# Patient Record
Sex: Female | Born: 1985
Health system: Southern US, Community
[De-identification: ages and names within clinical notes are randomized; demographics above are authoritative.]

## PROBLEM LIST (undated history)

## (undated) ENCOUNTER — Emergency Department (HOSPITAL_BASED_OUTPATIENT_CLINIC_OR_DEPARTMENT_OTHER): Payer: BLUE CROSS/BLUE SHIELD | Source: Home / Self Care

## (undated) DIAGNOSIS — Z8759 Personal history of other complications of pregnancy, childbirth and the puerperium: Secondary | ICD-10-CM

## (undated) DIAGNOSIS — I10 Essential (primary) hypertension: Secondary | ICD-10-CM

## (undated) DIAGNOSIS — N920 Excessive and frequent menstruation with regular cycle: Secondary | ICD-10-CM

## (undated) DIAGNOSIS — G43909 Migraine, unspecified, not intractable, without status migrainosus: Secondary | ICD-10-CM

## (undated) DIAGNOSIS — J302 Other seasonal allergic rhinitis: Secondary | ICD-10-CM

## (undated) DIAGNOSIS — K219 Gastro-esophageal reflux disease without esophagitis: Secondary | ICD-10-CM

## (undated) DIAGNOSIS — N926 Irregular menstruation, unspecified: Secondary | ICD-10-CM

## (undated) DIAGNOSIS — D649 Anemia, unspecified: Secondary | ICD-10-CM

## (undated) DIAGNOSIS — D573 Sickle-cell trait: Secondary | ICD-10-CM

## (undated) HISTORY — PX: WISDOM TOOTH EXTRACTION: SHX21

## (undated) HISTORY — DX: Gastro-esophageal reflux disease without esophagitis: K21.9

## (undated) HISTORY — DX: Essential (primary) hypertension: I10

## (undated) HISTORY — DX: Sickle-cell trait: D57.3

## (undated) HISTORY — DX: Migraine, unspecified, not intractable, without status migrainosus: G43.909

---

## 2008-10-19 ENCOUNTER — Inpatient Hospital Stay (HOSPITAL_COMMUNITY): Admission: AD | Admit: 2008-10-19 | Discharge: 2008-10-19 | Payer: Self-pay | Admitting: Obstetrics and Gynecology

## 2008-11-18 ENCOUNTER — Inpatient Hospital Stay (HOSPITAL_COMMUNITY): Admission: AD | Admit: 2008-11-18 | Discharge: 2008-11-18 | Payer: Self-pay | Admitting: Obstetrics and Gynecology

## 2008-11-20 ENCOUNTER — Inpatient Hospital Stay (HOSPITAL_COMMUNITY): Admission: AD | Admit: 2008-11-20 | Discharge: 2008-11-20 | Payer: Self-pay | Admitting: Obstetrics and Gynecology

## 2008-11-21 ENCOUNTER — Inpatient Hospital Stay (HOSPITAL_COMMUNITY): Admission: AD | Admit: 2008-11-21 | Discharge: 2008-11-21 | Payer: Self-pay | Admitting: Obstetrics and Gynecology

## 2008-11-26 ENCOUNTER — Inpatient Hospital Stay (HOSPITAL_COMMUNITY): Admission: AD | Admit: 2008-11-26 | Discharge: 2008-11-26 | Payer: Self-pay | Admitting: Obstetrics and Gynecology

## 2008-11-27 ENCOUNTER — Inpatient Hospital Stay (HOSPITAL_COMMUNITY): Admission: AD | Admit: 2008-11-27 | Discharge: 2008-11-28 | Payer: Self-pay | Admitting: Obstetrics and Gynecology

## 2008-12-06 ENCOUNTER — Inpatient Hospital Stay (HOSPITAL_COMMUNITY): Admission: AD | Admit: 2008-12-06 | Discharge: 2008-12-08 | Payer: Self-pay | Admitting: Obstetrics and Gynecology

## 2008-12-07 ENCOUNTER — Encounter: Payer: Self-pay | Admitting: Obstetrics and Gynecology

## 2008-12-10 ENCOUNTER — Inpatient Hospital Stay (HOSPITAL_COMMUNITY): Admission: AD | Admit: 2008-12-10 | Discharge: 2008-12-10 | Payer: Self-pay | Admitting: Obstetrics and Gynecology

## 2008-12-13 ENCOUNTER — Inpatient Hospital Stay (HOSPITAL_COMMUNITY): Admission: AD | Admit: 2008-12-13 | Discharge: 2008-12-13 | Payer: Self-pay | Admitting: Obstetrics and Gynecology

## 2008-12-19 ENCOUNTER — Inpatient Hospital Stay (HOSPITAL_COMMUNITY): Admission: AD | Admit: 2008-12-19 | Discharge: 2008-12-19 | Payer: Self-pay | Admitting: Obstetrics & Gynecology

## 2008-12-21 ENCOUNTER — Inpatient Hospital Stay (HOSPITAL_COMMUNITY): Admission: AD | Admit: 2008-12-21 | Discharge: 2008-12-21 | Payer: Self-pay | Admitting: Obstetrics & Gynecology

## 2008-12-24 ENCOUNTER — Inpatient Hospital Stay (HOSPITAL_COMMUNITY): Admission: AD | Admit: 2008-12-24 | Discharge: 2008-12-24 | Payer: Self-pay | Admitting: Obstetrics and Gynecology

## 2008-12-27 ENCOUNTER — Inpatient Hospital Stay (HOSPITAL_COMMUNITY): Admission: AD | Admit: 2008-12-27 | Discharge: 2008-12-27 | Payer: Self-pay | Admitting: Obstetrics and Gynecology

## 2009-01-01 ENCOUNTER — Inpatient Hospital Stay (HOSPITAL_COMMUNITY): Admission: AD | Admit: 2009-01-01 | Discharge: 2009-01-01 | Payer: Self-pay | Admitting: Obstetrics and Gynecology

## 2009-01-02 ENCOUNTER — Inpatient Hospital Stay (HOSPITAL_COMMUNITY): Admission: AD | Admit: 2009-01-02 | Discharge: 2009-01-05 | Payer: Self-pay | Admitting: Obstetrics and Gynecology

## 2009-01-07 ENCOUNTER — Encounter: Admission: RE | Admit: 2009-01-07 | Discharge: 2009-02-04 | Payer: Self-pay | Admitting: Obstetrics and Gynecology

## 2009-08-10 IMAGING — US US FETAL BPP W/O NONSTRESS
1 series · 14 of 25 positions shown · non-contrast
Comparison: none

OBSTETRICAL ULTRASOUND:
 This ultrasound exam was performed in the [HOSPITAL] Ultrasound Department.  The OB US report was generated in the AS system, and faxed to the ordering physician.  This report is also available in [REDACTED] PACS.

[Series 1: us fetal bpp w/o nonstress · non-contrast · 14 of 25 slices shown]
[im 1/25]
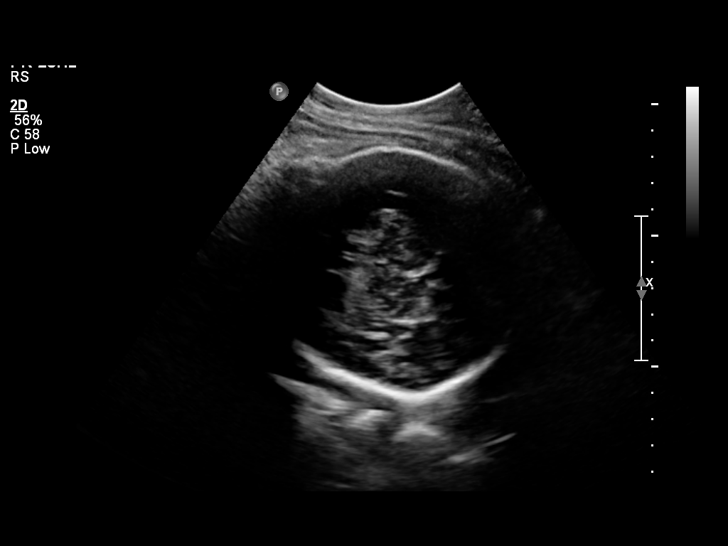
[im 3/25]
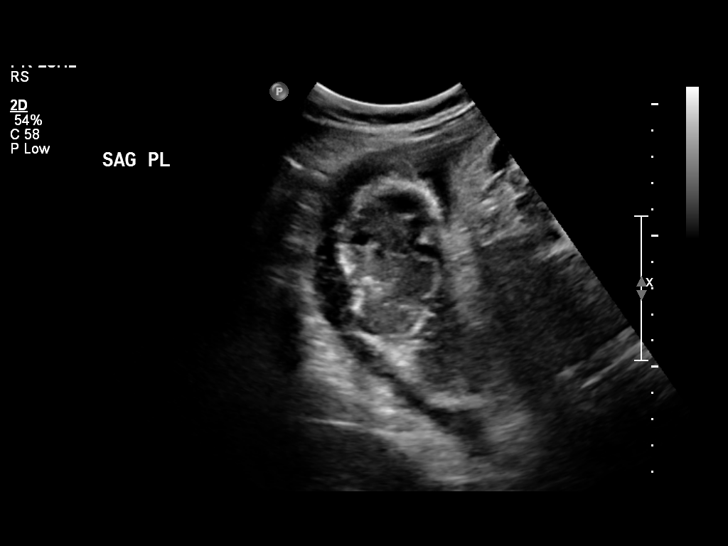
[im 5/25]
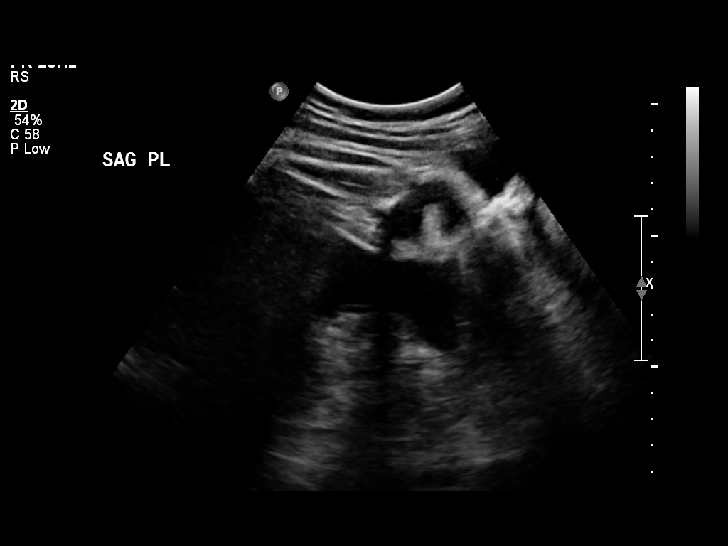
[im 7/25]
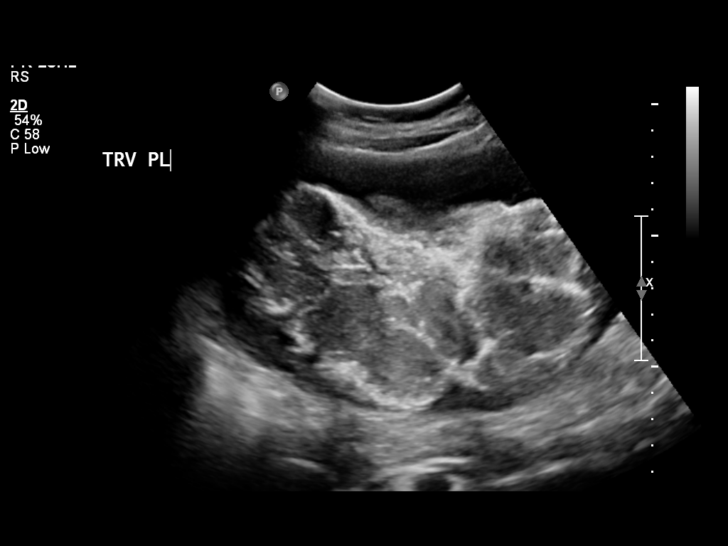
[im 9/25]
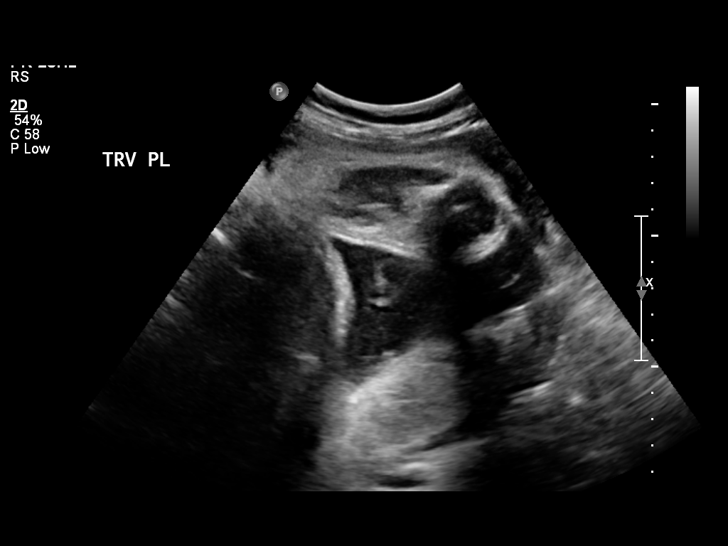
[im 10/25]
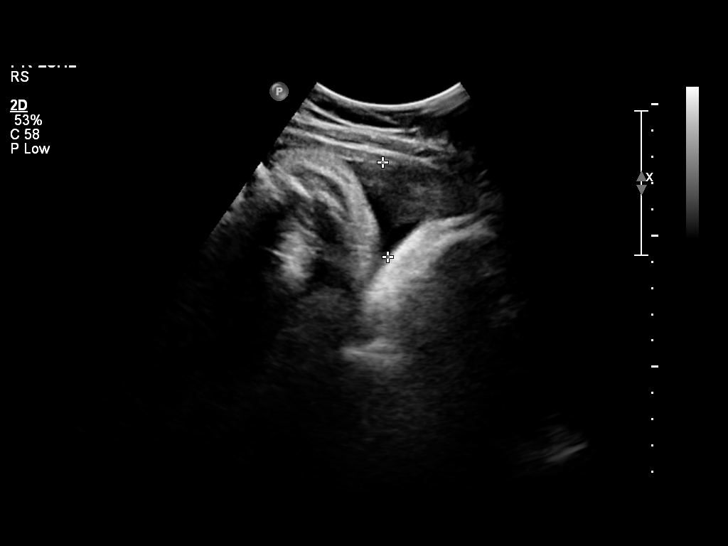
[im 12/25]
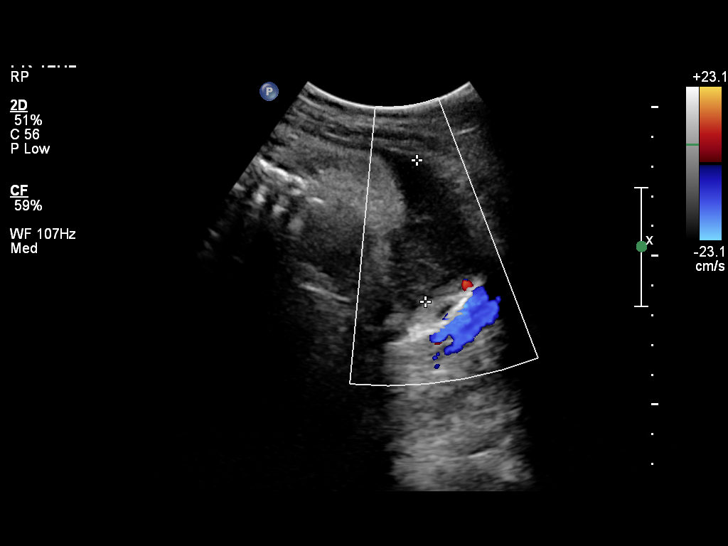
[im 14/25]
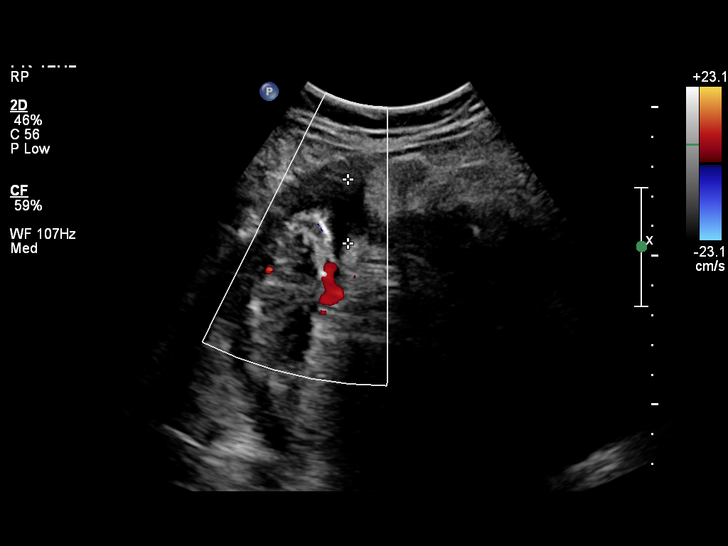
[im 16/25]
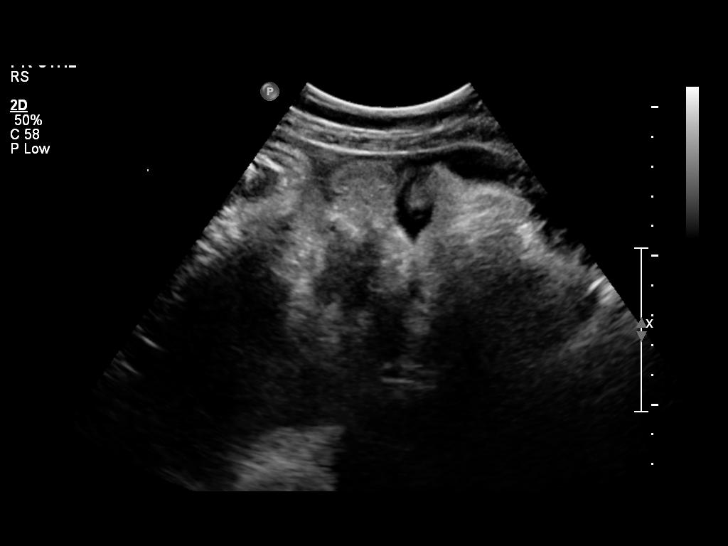
[im 17/25]
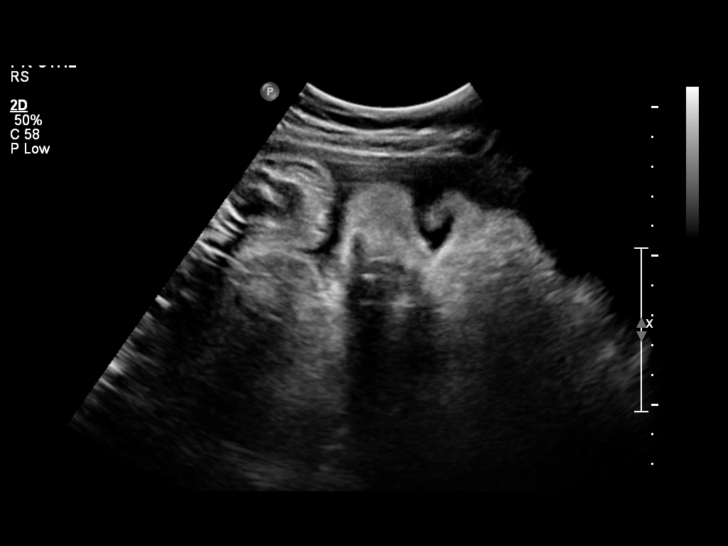
[im 19/25]
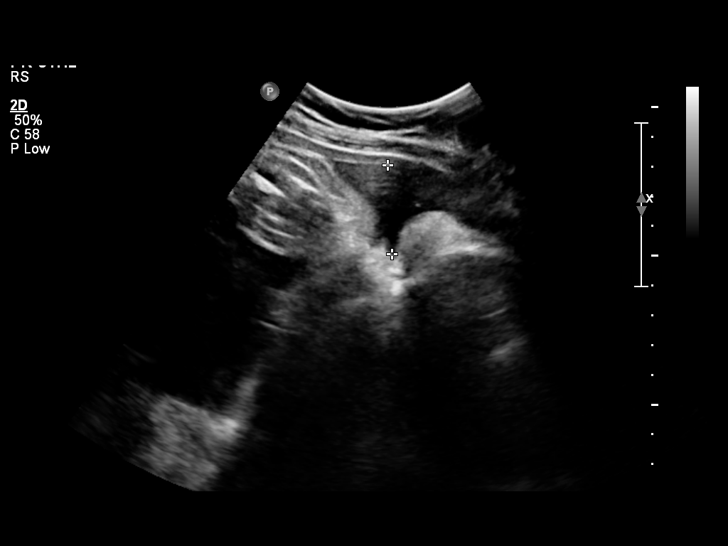
[im 21/25]
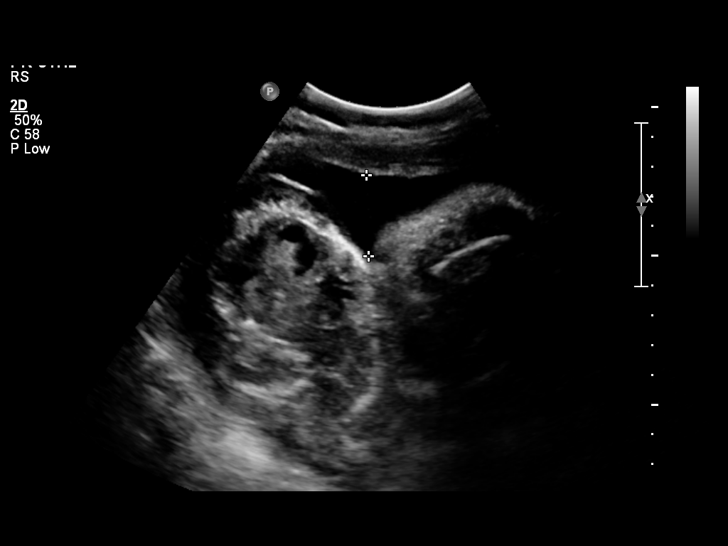
[im 23/25]
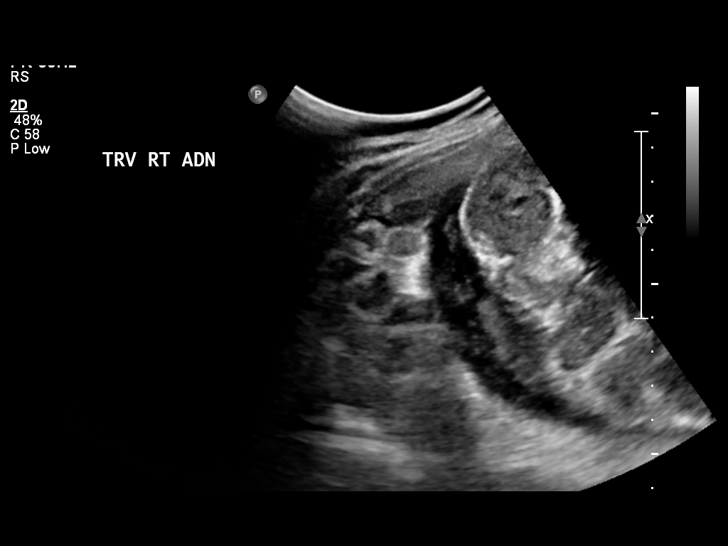
[im 25/25]
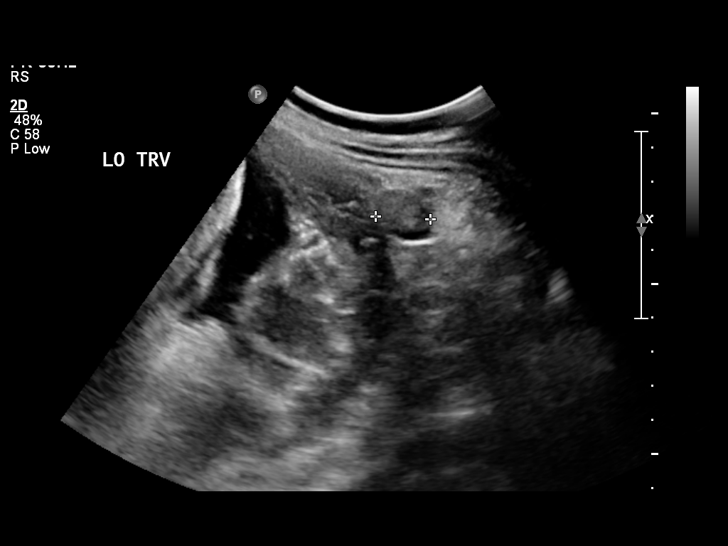

[14 of 25 positions shown; findings below may reference images not displayed]

IMPRESSION: See AS Obstetric US report.

## 2009-08-23 IMAGING — US US OB COMP +14 WK
1 series · 14 of 25 positions shown · non-contrast
Comparison: none

OBSTETRICAL ULTRASOUND:
 This ultrasound exam was performed in the [HOSPITAL] Ultrasound Department.  The OB US report was generated in the AS system, and faxed to the ordering physician.  This report is also available in [REDACTED] PACS.

[Series 1: us fetal bpp w/o nonstress · non-contrast · 25 acquisitions, 14 frames shown]
[im 1/25]
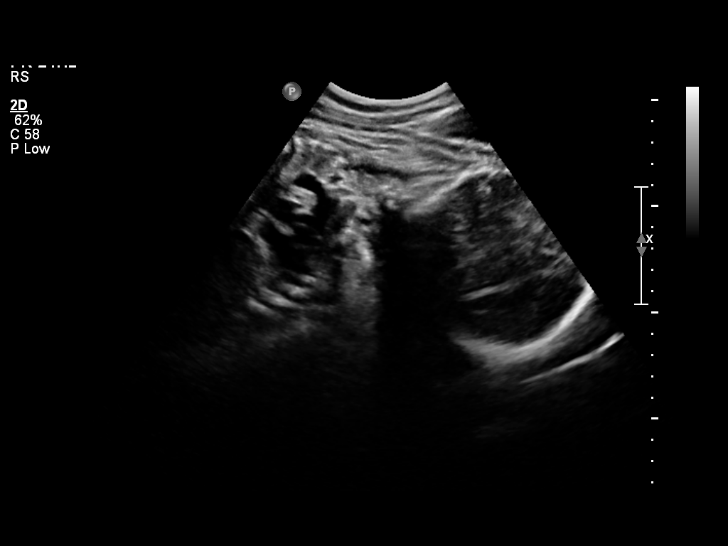
[im 3/25]
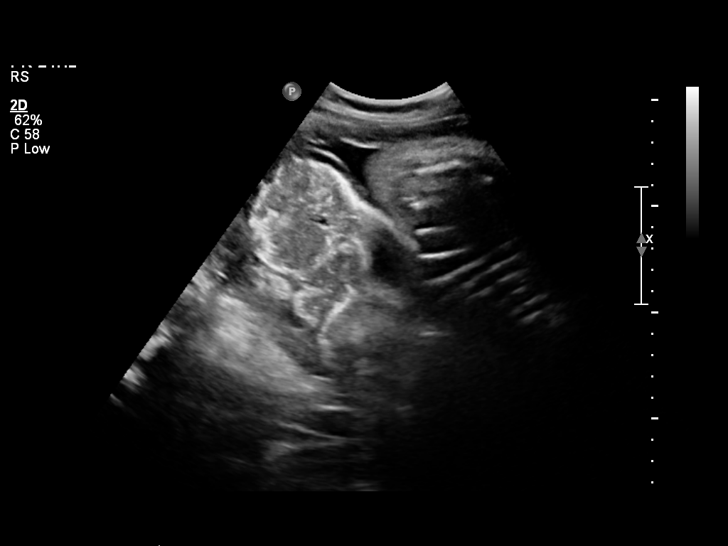
[im 5/25]
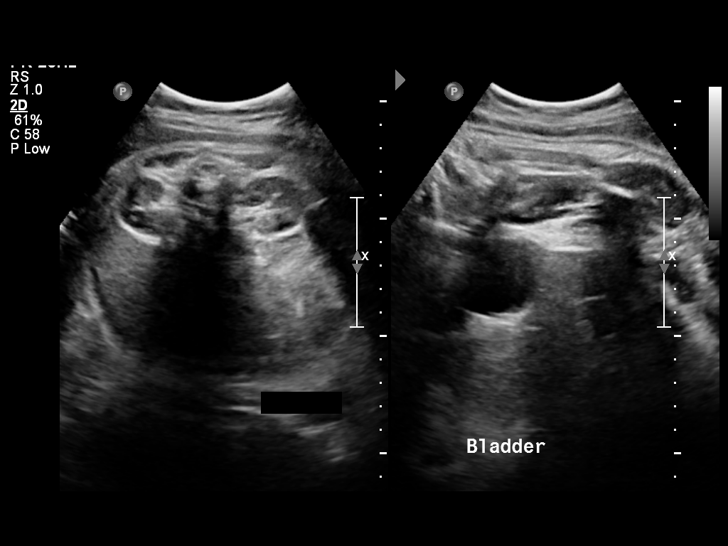
[im 7/25]
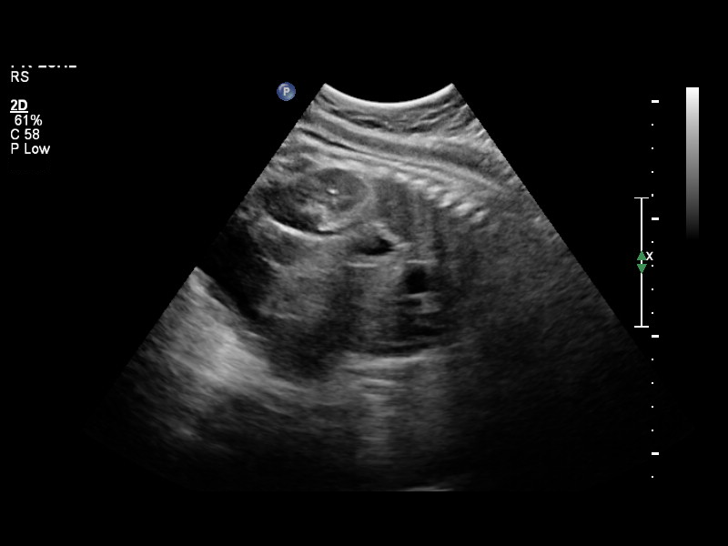
[im 9/25]
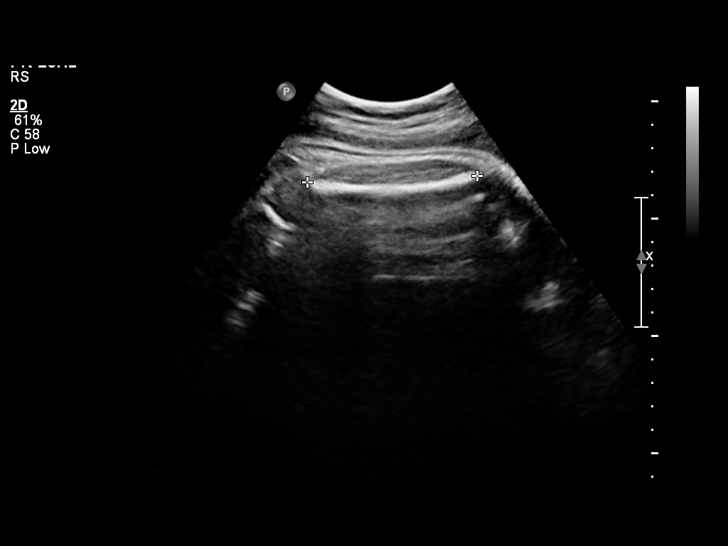
[im 10/25]
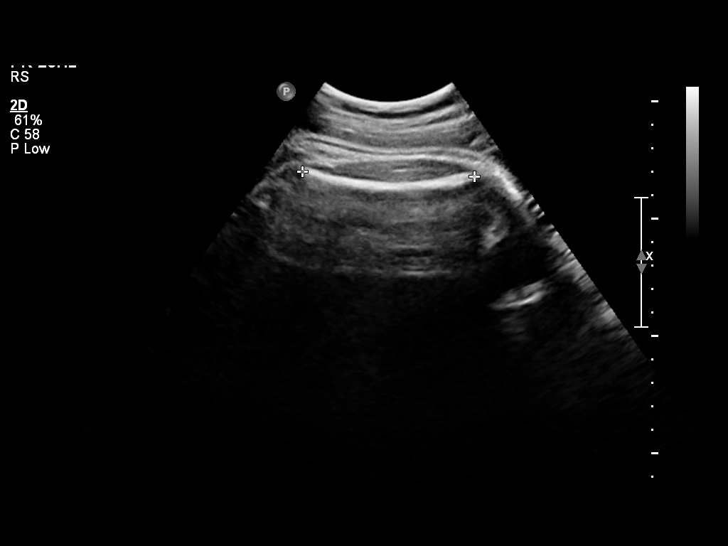
[im 12/25]
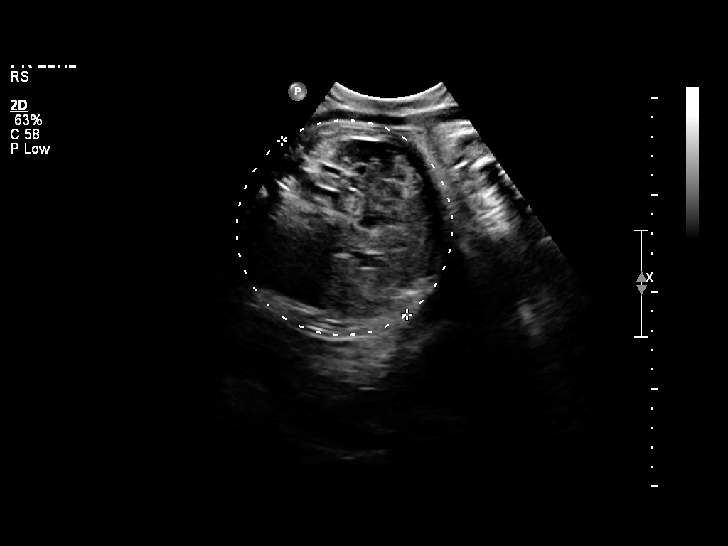
[im 14/25]
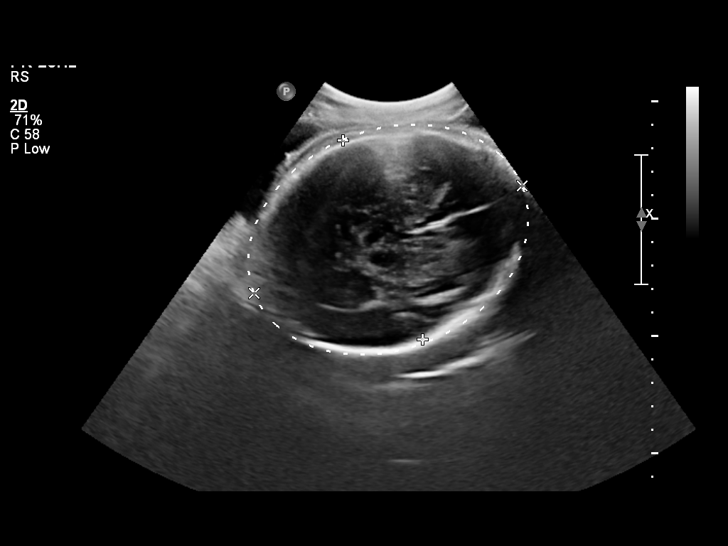
[im 16/25]
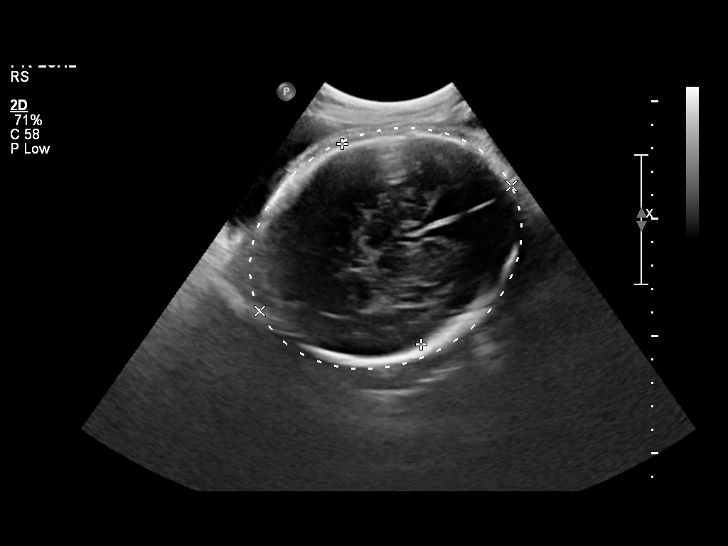
[im 17/25]
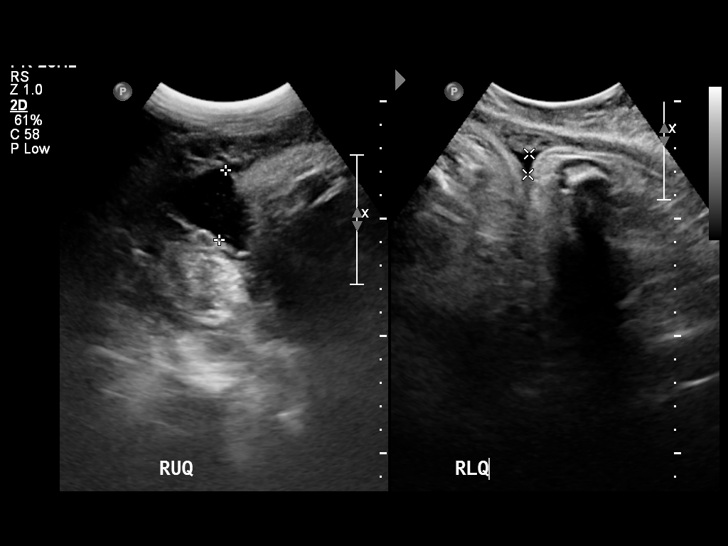
[im 19/25]
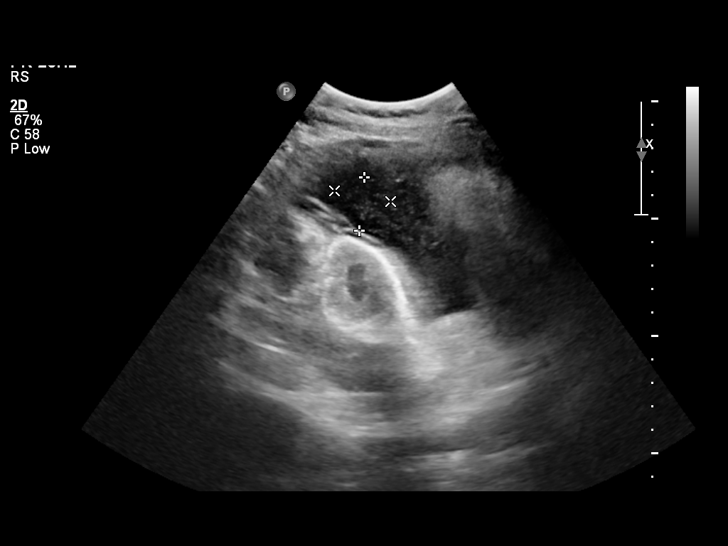
[im 21/25]
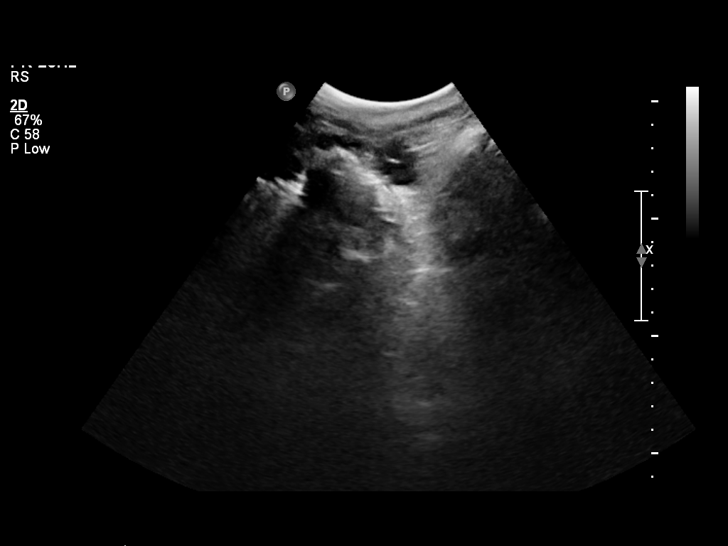
[im 23/25]
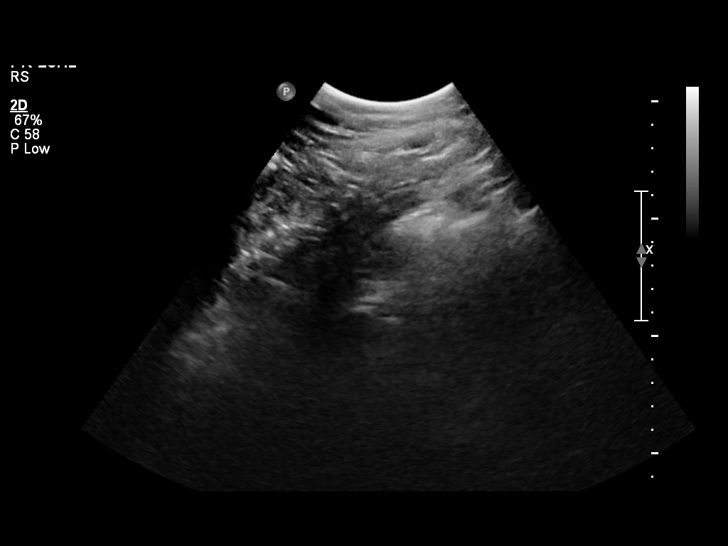
[im 25/25]
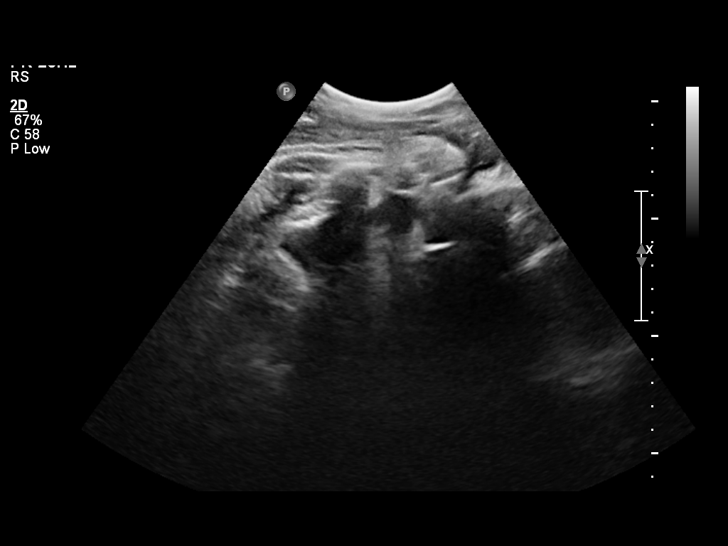

[14 of 25 positions shown; findings below may reference images not displayed]

IMPRESSION: See AS Obstetric US report.

## 2011-01-18 ENCOUNTER — Encounter: Payer: Self-pay | Admitting: Obstetrics and Gynecology

## 2011-04-13 LAB — CBC
HCT: 27.9 % — ABNORMAL LOW (ref 36.0–46.0)
Hemoglobin: 11 g/dL — ABNORMAL LOW (ref 12.0–15.0)
Hemoglobin: 9.2 g/dL — ABNORMAL LOW (ref 12.0–15.0)
MCHC: 32.9 g/dL (ref 30.0–36.0)
MCV: 81.4 fL (ref 78.0–100.0)
MCV: 82.7 fL (ref 78.0–100.0)
Platelets: 246 10*3/uL (ref 150–400)
Platelets: 300 10*3/uL (ref 150–400)
RBC: 3.37 MIL/uL — ABNORMAL LOW (ref 3.87–5.11)
RBC: 4.1 MIL/uL (ref 3.87–5.11)
RDW: 17.9 % — ABNORMAL HIGH (ref 11.5–15.5)
RDW: 18 % — ABNORMAL HIGH (ref 11.5–15.5)
WBC: 10.4 10*3/uL (ref 4.0–10.5)

## 2011-04-13 LAB — RPR: RPR Ser Ql: NONREACTIVE

## 2011-05-12 NOTE — Discharge Summary (Signed)
NAME:  Kimberly Guerra, Kimberly Guerra           ACCOUNT NO.:  [id number]   MEDICAL RECORD NO.:  [id number]          PATIENT TYPE:  INP   LOCATION:  9161                          FACILITY:  [facility]   PHYSICIAN:  Guy Sandifer. Henderson Cloud, M.D. DATE OF BIRTH:  Apr 29, 1986   DATE OF ADMISSION:  11/27/2008  DATE OF DISCHARGE:  11/28/2008                               DISCHARGE SUMMARY   ADMITTING DIAGNOSES:  1. Intrauterine pregnancy at 33 weeks, estimated gestational age.  2. Preterm labor.  3. Low amniotic fluid.   DISCHARGE DIAGNOSES:  1. Intrauterine pregnancy at 33 weeks, estimated gestational age.  2. Preterm labor.  3. Low amniotic fluid.   REASON FOR ADMISSION:  This patient is a 25 year old black female G2,  P0, EDC of 01/15/2009.  She has been in the hospital for preterm labor  and received betamethasone.  She had been discharged home, and presented  the office for a scheduled reevaluation.  A nonstress test was done that  was reactive, but there were some variable decelerations.  Ultrasound  revealed a biophysical profile of 8/8.  Amniotic fluid index was 9.1,  which is at the 10th percentile.  Estimated fetal weight was 76  percentile and the cervical length was 1.8 cm.  In view of these  findings, the patient was admitted to hospital for observation.   HOSPITAL COURSE:  The patient was admitted to hospital where she  receives IV fluids and rest.  Through the night, she complained of  severe back pain.  This did not correlate with contractions noted on the  monitor.  The baby was reactive.  The patient was treated first with  oral narcotics followed by IV Stadol, which relieved her back spasm.  She had a repeat ultrasound on the a.m. of November 28, 2008, which was  not long after her IV Stadol.  An AFI of 8.9 at the 10th percentile was  noted.  Baby was transverse at the maternal left.  BPP was 4/8.  Dopplers were normal.  Consultation with Maternal-Fetal Medicine was  obtained.  In view of the  recent narcotic administration, Dr. Margot Ables  recommended a repeat biophysical profile later in the day.  Repeat  biophysical profile later in the afternoon was 8/8 with a vertex  presentation.  Dr. Margot Ables had recommended discharged home in the event  of an 8/8 biophysical profile.  This was discussed with the patient.   CONDITION ON DISCHARGE:  Stable.   DIET:  Regular as tolerated.   ACTIVITY:  Careful instructions were given to the patient and her  mother.  Modified bedrest is carefully discussed.  Fetal movement counts  were reviewed.   FOLLOWUP:  Followup will be in 2 days in the office for an ultrasound  and nonstress test followed by an appointment with Maternal-Fetal  Medicine for ultrasound in 5 days.  She will call for any decrease in  fetal movement, increase in pelvic pressure, suspicion of leaking,  bleeding, etc.   MEDICATIONS:  1. Procardia 10 mg q.6 h.  2. Flexeril 10 mg, #30 one p.o. q.8 h. p.r.n. back pain, no refills.  3. Vicodin, #30  one to two q.6 h. p.r.n. back pain, no refills.      Guy Sandifer Henderson Cloud, M.D.  Electronically Signed     JET/MEDQ  D:  11/28/2008  T:  11/29/2008  Job:  [id number]   cc:   Felipa Eth, MD  Fax: [phone]

## 2011-05-12 NOTE — Op Note (Signed)
NAME:  Kimberly Guerra, Kimberly Guerra           ACCOUNT NO.:  [id number]   MEDICAL RECORD NO.:  [id number]          PATIENT TYPE:  INP   LOCATION:  9109                          FACILITY:  [facility]   PHYSICIAN:  Michelle L. Grewal, M.D.DATE OF BIRTH:  [birth year]   DATE OF PROCEDURE:  01/02/2009  DATE OF DISCHARGE:                               OPERATIVE REPORT   PREOPERATIVE DIAGNOSES:  1. Intrauterine pregnancy at term.  2. Arrest of dilatation at 5 cm.   POSTOPERATIVE DIAGNOSES:  1. Intrauterine pregnancy at term.  2. Arrest of dilatation at 5 cm.   PROCEDURE:  Primary low transverse cesarean section.   SURGEON:  Michelle L. Vincente Poli, MD.   ANESTHESIA:  Epidural.   FINDINGS:  Female infant in cephalic presentation.  Apgars 9 at 1 minute  and 9 at 5 minutes.   ESTIMATED BLOOD LOSS:  Less than 500 mL.   COMPLICATIONS:  None.   PROCEDURE:  The patient was taken to the operating room where her  epidural was dosed.  She was prepped and draped in the usual sterile  fashion.  A Foley catheter had been inserted while in Labor and  Delivery.  A sterile drape was applied.  A low-transverse incision was  made and carried down to the fascia.  The fascia was scored in the  midline and extended laterally.  The rectus muscles were separated in  the midline.  The peritoneum was entered bluntly.  The peritoneal  incision was then stretched.  The bladder blade was inserted.  The lower  uterine segment was identified and the bladder flap was created sharply  and then digitally.  The bladder blade was then readjusted.  A low-  transverse incision was made in the uterus.  The uterus was entered  using the hemostat.  The baby was in the cephalic presentation and was  delivered easily with 1 gentle pull of the vacuum.  The baby was a female  infant in cephalic presentation with Apgars 9 at 1 minute and 9 at 5  minutes.  The neonatal team was there to take care of the baby and  subsequently took the baby to the  nursery.  The placenta was manually  removed and noted to be normal intact with a 3-vessel cord.  The uterus  was exteriorized and cleared of all clots and debris.  Adnexa appeared  normal.  The uterine incision was closed in 2 layers using 0 chromic in  a running locked stitch.  The uterus was returned to the abdomen.  Irrigation was performed.  The incision was hemostatic.  The peritoneum  and rectus  muscles were reapproximated using 0 Vicryl.  The fascia was closed using  0 Vicryl in a running stitch.  After irrigation of the subcutaneous  layer, the skin was closed with staples.  All sponge, lap, and  instrument counts were correct x2.  The patient went to recovery room in  stable condition.      Michelle L. Vincente Poli, M.D.  Electronically Signed     MLG/MEDQ  D:  01/02/2009  T:  01/03/2009  Job:  [id number]

## 2011-05-12 NOTE — H&P (Signed)
NAMEMarland Kitchen  Kimberly Guerra, Kimberly Guerra           ACCOUNT NO.:  [id number]   MEDICAL RECORD NO.:  [id number]          PATIENT TYPE:  INP   LOCATION:  9163                          FACILITY:  [facility]   PHYSICIAN:  Freddy Finner, M.D.   DATE OF BIRTH:  07-06-1986   DATE OF ADMISSION:  12/06/2008  DATE OF DISCHARGE:                              HISTORY & PHYSICAL   ADMISSION DIAGNOSES:  1. Intrauterine pregnancy at 34-2/[redacted] weeks gestation.  2. Decreased amniotic fluid index.   The patient is a 25 year old gravida 2, para 0 who has been followed  through her prenatal course that was uneventful until the recent past  when she was found to have a decreased amniotic fluid index.  Subsequent  follow-up ultrasounds over the last approximately 9 days have shown an  initial improvement of her fluid volume on December 4, but on her  ultrasound in the office today her fluid volume was less and the third  percentile with an AFI of 7.1 cm.  By ultrasound exam her cervix was  normal.  Her fundal height was 34 cm.  Her blood pressure in the office,  124/70.  She is admitted now for hydration, bedrest and reevaluation of  decreased amniotic fluid index.   PAST MEDICAL HISTORY/FAMILY HISTORY:  Recorded in the prenatal summary  and will not be repeated.   PHYSICAL EXAMINATION:  HEENT:  Grossly normal.  Thyroid gland is not palpably enlarged.  Blood pressure 124/70.  CHEST:  Clear to auscultation.  HEART:  Normal sinus rhythm.  There is an early systolic murmur  consistent with a flow murmur.  No other audible murmurs.  No rubs.  No  gallops.  ABDOMEN:  Gravid.  Fundal height as noted above, 34 cm.  CERVICAL EXAM:  Not performed.  EXTREMITIES:  Without cyanosis, clubbing or edema.   ASSESSMENT:  1. Intrauterine pregnancy at 34-2/7.  2. Decreased fluid volume.   PLAN:  IV hydration, bedrest.  Reevaluation with ultrasound in 24-48  hours. S      W. Varney Baas, M.D.  Electronically Signed     WRN/MEDQ   D:  12/06/2008  T:  12/06/2008  Job:  [id number]

## 2011-05-12 NOTE — H&P (Signed)
NAME:  Kimberly Guerra, Kimberly Guerra           ACCOUNT NO.:  [id number]   MEDICAL RECORD NO.:  [id number]          PATIENT TYPE:  INP   LOCATION:  9161                          [facility]ACILITY:  [facility]   PHYSICIAN:  Juluis Mire, M.D.   DATE OF BIRTH:  1986-06-01   DATE OF ADMISSION:  11/27/2008  DATE OF DISCHARGE:                              HISTORY & PHYSICAL   The patient is a 25 year old gravida 2, para 0, abortus 1, female whose  last menstrual period is April 09, 2008, gives her estimated date of  confinement of January 15, 2009, and gives her estimated gestational age  of [redacted] weeks.  She evidently had been seen in the hospital with preterm  labor.  She has been receiving betamethasone.  Came in today for  reevaluation of nonstress test.  Nonstress test was reactive, but there  were some variables.  We did an ultrasound.  Biophysical profile is 8/8.  Amniotic fluid index was 9.1 cm, which is the 10th percentile.  Estimated fetal weight was 76th percentile.  Cervical length was 1.8 cm.  In view of the decreasing fluid and variables, we are going to bring her  in the hospital for hydration monitoring.  Repeat ultrasound tomorrow  for reassessment.  She will receive a second dose of betamethasone.   ALLERGIES:  No known drug allergies.   MEDICATION:  Prenatal vitamins.   PAST MEDICAL HISTORY:  Please see prenatal records.   FAMILY HISTORY:  Please see prenatal records.   SOCIAL HISTORY:  Please see prenatal records.   REVIEW OF SYSTEMS:  Noncontributory.   PHYSICAL EXAMINATION:  GENERAL:  The patient is afebrile.  VITAL SIGNS:  Stable.  HEENT: The patient is normocephalic.  Pupils are equal, round, and  reactive to light and accommodation.  Extraocular movements are intact.  Oropharynx is clear.  LUNGS:  Clear.  CARDIAC:  Regular rate without murmurs or gallops.  ABDOMEN:  Gravid.  Uterus is consistent with dates.  PELVIC:  Cervix is fingertip, 50%.  EXTREMITIES:  Trace edema.  NEUROLOGIC:  Grossly within normal limits.  Deep tendon reflexes are 2+,  no clonus.   IMPRESSION:  1. Intrauterine pregnancy at 33 weeks with borderline oligohydramnios.  2. Variable decelerations on monitoring, but reactive tracing.  3. Reassuring biophysical profile.   PLAN:  The patient brought in for IV hydration.  We will continue  monitoring and repeat her ultrasound tomorrow for assessment.      Juluis Mire, M.D.  Electronically Signed     JSM/MEDQ  D:  11/27/2008  T:  11/28/2008  Job:  [id number]

## 2011-05-15 NOTE — Discharge Summary (Signed)
NAMEMarland Guerra  Kimberly, Guerra           ACCOUNT NO.:  [id number]   MEDICAL RECORD NO.:  [id number]          PATIENT TYPE:  INP   LOCATION:  9109                          [facility]ACILITY[facility]   PHYSICIAN:  Zelphia Cairo, MD    DATE OF BIRTH:  02/14/1986   DATE OF ADMISSION:  01/02/2009  DATE OF DISCHARGE:  01/05/2009                               DISCHARGE SUMMARY   ADMITTING DIAGNOSES:  1. Intrauterine pregnancy at 38-1/7 weeks estimated gestational age.  2. Induction of labor.   DISCHARGE DIAGNOSES:  1. Status post low-transverse cesarean section secondary to failure to      progress.  2. Viable female infant.   PROCEDURE:  Primary low-transverse cesarean section.   REASON FOR ADMISSION:  Please see written H&P.   HOSPITAL COURSE:  The patient is 25 year old gravida 2, para 0 that  presented to [facility] for induction of labor.  The  patient had presented to [facility] where she complained of some pain in her  lower abdomen.  Fetal heart tones were nonreactive and biophysical  profile was 4/10.  Based on the biophysical profile, a score of 4//10.  The patient was now admitted for induction of labor.  Vital signs were  stable.  Fetal heart tones were in 140s with decreased variability.  Contractions was somewhat irregular.  Cervix was found to be 3 cm, 100%  effaced, vertex, at a zero station.  Artificial rupture of membranes was  performed, which revealed a scant amount of fluid.  The patient was now  started on Pitocin for augmentation of her labor and epidural was placed  for her comfort.  Later that evening, the patient had been in a good  pattern of labor all day.  Cervix was reexamined and found to be 5 cm  dilated, 90% effaced,  with caput.  Given there had not been any further  change in dilatation over 3 hours, contractions were strong and regular.  Decision was made to proceed with a primary low-transverse cesarean  section.  Benefits and risks associated with surgery  were discussed with  the patient and she was transferred then to the operating room.  Epidural was dosed to an adequate surgical level.  A low transverse  incision was made with delivery of a viable female infant weighing 7  pounds 15 ounces with Apgars of 9 at 1 minute and 9 at 5 minutes.  The  patient tolerated the procedure well and taken to the recovery room in  stable condition.  On postoperative day #1, the patient complained of  some mild nausea, no vomiting.  Vital signs were stable.  She was  afebrile.  Abdomen was soft with some decrease in bowel sounds.  Fundus  was firm and nontender.  Abdominal dressing was noted to have a scant  amount of drainage noted on the bandage.  Foley was draining with  adequate amounts of urine output.  Laboratory findings revealed  hemoglobin 9.2, platelet count of 246,000, WBC count of 16.5.  On  postoperative day #2, the patient denied headache or blurred vision.  Vital signs were stable with blood pressure 133/89  to 143/90.  Deep  tendon reflexes 1 to 2+.  No clonus, no pedal edema was observed.  Abdomen was soft with good return of bowel function.  Fundus was firm  and nontender.  Incision was clean, dry, and intact.  On postoperative  day #3, the patient was without complaint.  Vital signs were stable with  blood pressure 137/89.  Abdomen soft.  Fundus firm and nontender.  Incision was clean, dry, and intact.   DISCHARGE INSTRUCTIONS:  Reviewed, and the patient was later discharged  home.   CONDITION ON DISCHARGE:  Stable.   DIET:  Regular as tolerated.   ACTIVITY:  No heavy lifting, no driving x2 weeks, no vaginal entry.   FOLLOW UP:  The patient to follow up in the office in 2 days for staple  removal and a blood pressure check.  She is to call for temperature  greater than 100 degrees, persistent nausea, vomiting, heavy vaginal  bleeding and/or redness or drainage from the incisional site.  The  patient was also instructed to call for  headache, blurred vision, or  right upper quadrant pain.   DISCHARGE MEDICATIONS:  1. Percocet 5/325, #30, one  p.o. every 4-6 hours.  2. Motrin 600 mg every 6 hours.  3. Ferrous sulfate 325 mg one p.o. b.i.d.      Julio Sicks, N.P.      Zelphia Cairo, MD  Electronically Signed    CC/MEDQ  D:  02/12/2009  T:  02/12/2009  Job:  [id number]

## 2011-05-15 NOTE — Discharge Summary (Signed)
NAMEMarland Kitchen  Kimberly Guerra, Kimberly Guerra           ACCOUNT NO.:  [id number]   MEDICAL RECORD NO.:  [id number]          PATIENT TYPE:  INP   LOCATION:  9149                          [facility]ACILITY:  [facility]   PHYSICIAN:  Guy Sandifer. Henderson Cloud, M.D. DATE OF BIRTH:  [birth year]   DATE OF ADMISSION:  12/06/2008  DATE OF DISCHARGE:  12/08/2008                               DISCHARGE SUMMARY   ADMITTING DIAGNOSES:  1. Intrauterine pregnancy at 34-2/7 weeks' estimated gestational age.  2. Decreased amniotic fluid index.   DISCHARGE DIAGNOSES:  1. Intrauterine pregnancy at 34-4/7 weeks' estimated gestational age.  2. Normal amniotic fluid index.   REASON FOR ADMISSION:  Please see dictated H&P.   HOSPITAL COURSE:  The patient is a 25 year old gravida 2, para 0 who was  admitted to [facility] for IV hydration and bed rest.  The patient had been seen in the office where it was noted that she had  decreased amniotic fluid.  The patient had been seen in the office with  an ultrasound that had revealed that the fluid volume was less than the  third percentile, pregnancy had been otherwise uncomplicated.  Fundal  height was 34 cm, blood pressure was normal at 124/70.  The patient was  admitted for hydration, bed rest,  and reevaluation of decreased  amniotic fluid index.   On admission vital signs were stable with blood pressure 124/70.  The  patient was scheduled for maternal fetal medicine consult, biophysical  profile with Doppler studies.  Later that evening, studies had been  completed and AFI was 8.54 which is the 9th percentile.  Biophysical  profile was 8/8 and a Doppler studies were within normal limits.  Fetal  heart tones were reactive.  The patient continued on bed rest with IV  fluids and plan for rescan the following morning.  On the following  morning, the patient was now 34-4/7 weeks'.  She denied headache,  blurred vision, or epigastric pain.  Blood pressures was 140/87.  Lungs  were clear  to auscultation.  Heart was regular rate and rhythm.  Abdomen  was gravid uterus.  No epigastric tenderness noted.  Deep tendon  reflexes are 1+, no clonus.  Amniotic fluid index was up to 11.37,  vertex presentation.  PIH labs were examined and found to be within  normal limits.  Blood pressure was reexamined later that morning and  patient desired to have her discharged and patient was later discharged  home on bed rest.   CONDITION ON DISCHARGE:  Stable diet, regular as tolerated.   ACTIVITY:  Modified bedrest.  The patient was encouraged to call for  decrease in fetal movement or loss of amniotic fluid, vaginal bleeding,  or increasing contractions or pelvic pressure.   DISCHARGE MEDICATIONS:  1. Prenatal vitamins 1 p.o. daily.  2. Colace 1 p.o. daily p.r.n.      Julio Sicks, N.P.      Guy Sandifer. Henderson Cloud, M.D.  Electronically Signed    CC/MEDQ  D:  02/12/2009  T:  02/12/2009  Job:  [id number]

## 2011-09-29 LAB — URINALYSIS, ROUTINE W REFLEX MICROSCOPIC
Bilirubin Urine: NEGATIVE
Glucose, UA: NEGATIVE
Ketones, ur: 15 — AB
Nitrite: NEGATIVE
Nitrite: NEGATIVE
Protein, ur: NEGATIVE
Specific Gravity, Urine: 1.01
Urobilinogen, UA: 0.2
pH: 5.5
pH: 6.5

## 2011-09-29 LAB — WET PREP, GENITAL
Clue Cells Wet Prep HPF POC: NONE SEEN
Trich, Wet Prep: NONE SEEN

## 2011-09-29 LAB — FETAL FIBRONECTIN: Fetal Fibronectin: NEGATIVE

## 2011-09-29 LAB — URINE MICROSCOPIC-ADD ON

## 2011-10-02 LAB — COMPREHENSIVE METABOLIC PANEL
ALT: 11 U/L (ref 0–35)
AST: 17 U/L (ref 0–37)
Albumin: 2.7 g/dL — ABNORMAL LOW (ref 3.5–5.2)
Albumin: 3 g/dL — ABNORMAL LOW (ref 3.5–5.2)
BUN: 2 mg/dL — ABNORMAL LOW (ref 6–23)
Calcium: 9.7 mg/dL (ref 8.4–10.5)
Chloride: 106 mEq/L (ref 96–112)
Creatinine, Ser: 0.45 mg/dL (ref 0.4–1.2)
GFR calc Af Amer: >60 mL/min (ref >60)
Sodium: 135 mEq/L (ref 135–145)
Total Bilirubin: 0.3 mg/dL (ref 0.3–1.2)
Total Protein: 6.3 g/dL (ref 6.0–8.3)

## 2011-10-02 LAB — CBC
HCT: 32 % — ABNORMAL LOW (ref 36.0–46.0)
MCHC: 33.3 g/dL (ref 30.0–36.0)
MCHC: 33.4 g/dL (ref 30.0–36.0)
MCV: 81.8 fL (ref 78.0–100.0)
Platelets: 279 10*3/uL (ref 150–400)
RDW: 17.1 % — ABNORMAL HIGH (ref 11.5–15.5)
RDW: 17.2 % — ABNORMAL HIGH (ref 11.5–15.5)
WBC: 10.6 10*3/uL — ABNORMAL HIGH (ref 4.0–10.5)

## 2011-10-02 LAB — FETAL FIBRONECTIN: Fetal Fibronectin: NEGATIVE

## 2011-11-11 ENCOUNTER — Emergency Department (HOSPITAL_COMMUNITY)
Admission: EM | Admit: 2011-11-11 | Discharge: 2011-11-12 | Disposition: A | Discharge status: Home / Self Care | Payer: Self-pay | Attending: Emergency Medicine | Admitting: Emergency Medicine

## 2011-11-11 ENCOUNTER — Encounter: Payer: Self-pay | Admitting: Emergency Medicine

## 2011-11-11 DIAGNOSIS — R1032 Left lower quadrant pain: Secondary | ICD-10-CM | POA: Insufficient documentation

## 2011-11-11 DIAGNOSIS — N949 Unspecified condition associated with female genital organs and menstrual cycle: Secondary | ICD-10-CM | POA: Insufficient documentation

## 2011-11-11 DIAGNOSIS — R102 Pelvic and perineal pain: Secondary | ICD-10-CM

## 2011-11-11 LAB — DIFFERENTIAL
Lymphocytes Relative: 30 % (ref 12–46)
Lymphs Abs: 3.4 10*3/uL (ref 0.7–4.0)
Neutrophils Relative %: 62 % (ref 43–77)

## 2011-11-11 LAB — CBC
MCV: 74.9 fL — ABNORMAL LOW (ref 78.0–100.0)
Platelets: 464 10*3/uL — ABNORMAL HIGH (ref 150–400)
RBC: 4.62 MIL/uL (ref 3.87–5.11)
WBC: 11.3 10*3/uL — ABNORMAL HIGH (ref 4.0–10.5)

## 2011-11-11 NOTE — ED Notes (Signed)
PT. REPORTS LEFT ABDOMINAL PAIN ONSET THIS MORNING ,  NO NAUSEA OR VOMITTING , NO DIARRHEA, NO FEVER OR CHILLS, DENIES DYSURIA OR VAGINAL DISCHARGE.

## 2011-11-12 ENCOUNTER — Encounter (HOSPITAL_COMMUNITY): Payer: Self-pay | Admitting: *Deleted

## 2011-11-12 ENCOUNTER — Emergency Department (HOSPITAL_COMMUNITY): Payer: Self-pay

## 2011-11-12 LAB — WET PREP, GENITAL
WBC, Wet Prep HPF POC: NONE SEEN
Yeast Wet Prep HPF POC: NONE SEEN

## 2011-11-12 LAB — URINALYSIS, ROUTINE W REFLEX MICROSCOPIC
Glucose, UA: NEGATIVE mg/dL
Leukocytes, UA: NEGATIVE
Specific Gravity, Urine: 1.017 (ref 1.005–1.030)
pH: 7 (ref 5.0–8.0)

## 2011-11-12 LAB — BASIC METABOLIC PANEL
CO2: 24 mEq/L (ref 19–32)
Calcium: 9.7 mg/dL (ref 8.4–10.5)
GFR calc non Af Amer: >90 mL/min (ref >90)
Sodium: 136 mEq/L (ref 135–145)

## 2011-11-12 MED ORDER — HYDROMORPHONE HCL PF 1 MG/ML IJ SOLN
0.5000 mg | Freq: Once | INTRAMUSCULAR | Status: AC
  Administered 2011-11-12: 1 mg via INTRAVENOUS

## 2011-11-12 MED ORDER — HYDROMORPHONE HCL PF 1 MG/ML IJ SOLN
INTRAMUSCULAR | Status: AC
  Filled 2011-11-12: qty 1

## 2011-11-12 MED ORDER — OXYCODONE-ACETAMINOPHEN 5-325 MG PO TABS: 1.0000 | ORAL_TABLET | ORAL | Status: AC | PRN

## 2011-11-12 MED ORDER — DIPHENHYDRAMINE HCL 50 MG/ML IJ SOLN
25.0000 mg | Freq: Once | INTRAMUSCULAR | Status: AC
  Administered 2011-11-12: 25 mg via INTRAVENOUS

## 2011-11-12 MED ORDER — FENTANYL CITRATE 0.05 MG/ML IJ SOLN
50.0000 ug | Freq: Once | INTRAMUSCULAR | Status: AC
  Administered 2011-11-12: 100 ug via INTRAVENOUS
  Filled 2011-11-12: qty 2

## 2011-11-12 MED ORDER — DIPHENHYDRAMINE HCL 50 MG/ML IJ SOLN
INTRAMUSCULAR | Status: AC
  Filled 2011-11-12: qty 1

## 2011-11-12 MED ORDER — HYDROMORPHONE HCL PF 1 MG/ML IJ SOLN
INTRAMUSCULAR | Status: AC
  Administered 2011-11-12: 1 mg via INTRAVASCULAR
  Filled 2011-11-12: qty 1

## 2011-11-12 MED ORDER — LORAZEPAM 2 MG/ML IJ SOLN
INTRAMUSCULAR | Status: AC
  Administered 2011-11-12: 1 mg via INTRAVASCULAR
  Filled 2011-11-12: qty 1

## 2011-11-12 NOTE — ED Provider Notes (Signed)
History     CSN: [id number] Arrival date & time: 11/11/2011 10:41 PM   First MD Initiated Contact with Patient 11/11/11 2356      Chief Complaint  Patient presents with  . Abdominal Pain    (Consider location/radiation/quality/duration/timing/severity/associated sxs/prior treatment) Patient is a 25 y.o. female presenting with abdominal pain. The history is provided by the patient.  Abdominal Pain The primary symptoms of the illness include abdominal pain and dysuria. The primary symptoms of the illness do not include fever, nausea, vomiting, diarrhea, vaginal discharge or vaginal bleeding. Primary symptoms comment: She has had similar symptoms in the past with ovarian cysts. The current episode started 6 to 12 hours ago. The onset of the illness was gradual. The problem has been gradually worsening.  Symptoms associated with the illness do not include chills.    Past Medical History  Diagnosis Date  . Asthma     History reviewed. No pertinent past surgical history.  No family history on file.  History  Substance Use Topics  . Smoking status: Never Smoker   . Smokeless tobacco: Not on file  . Alcohol Use: No    OB History    Grav Para Term Preterm Abortions TAB SAB Ect Mult Living                  Review of Systems  Constitutional: Negative for fever and chills.  HENT: Negative.   Respiratory: Negative.   Cardiovascular: Negative.   Gastrointestinal: Positive for abdominal pain. Negative for nausea, vomiting and diarrhea.  Genitourinary: Positive for dysuria. Negative for vaginal bleeding and vaginal discharge.  Musculoskeletal: Negative.   Skin: Negative.   Neurological: Negative.     Allergies  Review of patient's allergies indicates no known allergies.  Home Medications   Current Outpatient Rx  Name Route Sig Dispense Refill  . ALBUTEROL SULFATE HFA 108 (90 BASE) MCG/ACT IN AERS Inhalation Inhale 2 puffs into the lungs every 6 (six) hours as needed. For  wheeze or shortness of breath       BP 146/96  Pulse 93  Temp(Src) 98.6 F (37 C) (Oral)  Resp 22  SpO2 99%  LMP 10/29/2011  Physical Exam  Constitutional: She appears well-developed and well-nourished.  HENT:  Head: Normocephalic.  Neck: Normal range of motion. Neck supple.  Cardiovascular: Normal rate and regular rhythm.   Pulmonary/Chest: Effort normal and breath sounds normal.  Abdominal: Soft. Bowel sounds are normal. There is no tenderness. There is no rebound and no guarding.  Genitourinary: There is tenderness around the vagina. No vaginal discharge found.  Musculoskeletal: Normal range of motion.  Neurological: She is alert. No cranial nerve deficit.  Skin: Skin is warm and dry. No rash noted.  Psychiatric: She has a normal mood and affect.    ED Course  Procedures (including critical care time)  Labs Reviewed  BASIC METABOLIC PANEL - Abnormal; Notable for the following:    Glucose, Bld 114 (*)    All other components within normal limits  CBC - Abnormal; Notable for the following:    WBC 11.3 (*)    Hemoglobin 11.6 (*)    HCT 34.6 (*)    MCV 74.9 (*)    MCH 25.1 (*)    Platelets 464 (*)    All other components within normal limits  DIFFERENTIAL  POCT PREGNANCY, URINE  URINALYSIS, ROUTINE W REFLEX MICROSCOPIC  WET PREP, GENITAL  GC/CHLAMYDIA PROBE AMP, GENITAL   No results found.   No diagnosis found.  MDM          Rodena Medin, PA 11/18/11 [phone]

## 2011-11-12 NOTE — ED Notes (Signed)
Pt reports having sudden onset of (L) sided lower abdominal pain that started this morning and progressively got worse as the day went on.  Pt reports having no nausea or vomiting.  Pt noted to be curled up in the fetal position crying.  Pt reports having pain into her back and burning with urination.  Skin warm, dry and intact.  Neuro intact.

## 2011-11-12 NOTE — ED Provider Notes (Signed)
Medical screening examination/treatment/procedure(s) were conducted as a shared visit with non-physician practitioner(s) and myself.  I personally evaluated the patient during the encounter   Nursing notes and vitals signs, including pulse oximetry, reviewed.  Summary of this visit's results, reviewed by myself:  Labs:  Results for orders placed during the hospital encounter of 11/11/11  BASIC METABOLIC PANEL      Component Value Range   Sodium 136  135 - 145 (mEq/L)   Potassium 3.9  3.5 - 5.1 (mEq/L)   Chloride 100  96 - 112 (mEq/L)   CO2 24  19 - 32 (mEq/L)   Glucose, Bld 114 (*) 70 - 99 (mg/dL)   BUN 8  6 - 23 (mg/dL)   Creatinine, Ser 1.61  0.50 - 1.10 (mg/dL)   Calcium 9.7  8.4 - 09.6 (mg/dL)   GFR calc non Af Amer >90  >90 (mL/min)   GFR calc Af Amer >90  >90 (mL/min)  CBC      Component Value Range   WBC 11.3 (*) 4.0 - 10.5 (K/uL)   RBC 4.62  3.87 - 5.11 (MIL/uL)   Hemoglobin 11.6 (*) 12.0 - 15.0 (g/dL)   HCT 04.5 (*) 40.9 - 46.0 (%)   MCV 74.9 (*) 78.0 - 100.0 (fL)   MCH 25.1 (*) 26.0 - 34.0 (pg)   MCHC 33.5  30.0 - 36.0 (g/dL)   RDW 81.1  91.4 - 78.2 (%)   Platelets 464 (*) 150 - 400 (K/uL)  DIFFERENTIAL      Component Value Range   Neutrophils Relative 62  43 - 77 (%)   Neutro Abs 7.0  1.7 - 7.7 (K/uL)   Lymphocytes Relative 30  12 - 46 (%)   Lymphs Abs 3.4  0.7 - 4.0 (K/uL)   Monocytes Relative 6  3 - 12 (%)   Monocytes Absolute 0.7  0.1 - 1.0 (K/uL)   Eosinophils Relative 2  0 - 5 (%)   Eosinophils Absolute 0.2  0.0 - 0.7 (K/uL)   Basophils Relative 0  0 - 1 (%)   Basophils Absolute 0.0  0.0 - 0.1 (K/uL)  URINALYSIS, ROUTINE W REFLEX MICROSCOPIC      Component Value Range   Color, Urine YELLOW  YELLOW    Appearance CLOUDY (*) CLEAR    Specific Gravity, Urine 1.017  1.005 - 1.030    pH 7.0  5.0 - 8.0    Glucose, UA NEGATIVE  NEGATIVE (mg/dL)   Hgb urine dipstick NEGATIVE  NEGATIVE    Bilirubin Urine NEGATIVE  NEGATIVE    Ketones, ur NEGATIVE  NEGATIVE  (mg/dL)   Protein, ur NEGATIVE  NEGATIVE (mg/dL)   Urobilinogen, UA 1.0  0.0 - 1.0 (mg/dL)   Nitrite POSITIVE (*) NEGATIVE    Leukocytes, UA NEGATIVE  NEGATIVE   WET PREP, GENITAL      Component Value Range   Yeast, Wet Prep NONE SEEN  NONE SEEN    Trich, Wet Prep NONE SEEN  NONE SEEN    Clue Cells, Wet Prep FEW (*) NONE SEEN    WBC, Wet Prep HPF POC NONE SEEN  NONE SEEN   POCT PREGNANCY, URINE      Component Value Range   Preg Test, Ur NEGATIVE    URINE MICROSCOPIC-ADD ON      Component Value Range   Squamous Epithelial / LPF FEW (*) RARE    WBC, UA 0-2  <3 (WBC/hpf)   Bacteria, UA MANY (*) RARE     Imaging  Studies: US Transvaginal Non-ob  2011/11/30  *RADIOLOGY REPORT*  Clinical Data:  Left adnexal pain for 1 day.  Leukocytosis.  TRANSABDOMINAL AND TRANSVAGINAL ULTRASOUND OF PELVIS DOPPLER ULTRASOUND OF OVARIES  Technique:  Both transabdominal and transvaginal ultrasound examinations of the pelvis were performed. Transabdominal technique was performed for global imaging of the pelvis including uterus, ovaries, adnexal regions, and pelvic cul-de-sac.  It was necessary to proceed with endovaginal exam following the transabdominal exam to visualize the uterus and ovaries in greater detail.  Color and duplex Doppler ultrasound was utilized to evaluate blood flow to the ovaries.  Comparison:  Prior ultrasound of pregnancy performed 01/02/2009  Findings:  Uterus:  Normal in size and appearance; retroflexed.  Measures 6.6 x 4.4 x 4.9 cm.  Endometrium:  Grossly normal in appearance; measures 1.4 cm in thickness, likely reflecting the secretory phase of the ovulatory cycle.  A trace amount of fluid is noted along the cervical canal.  Right ovary: Normal appearance/no adnexal mass; measures 3.3 x 1.8 x 2.5 cm.  Left ovary:   Normal appearance/no adnexal mass; measures 3.1 x 2.3 x 2.0 cm.  A vague 1.0 cm hypoechoic focus at the left ovary could reflect a recently ruptured cyst.  Pulsed Doppler  evaluation demonstrates normal low-resistance arterial and venous waveforms in both ovaries.  There is diffusely increased color Doppler blood flow to the left adnexa as compared to the right.  A small amount of free fluid is noted at the left adnexa, and also at the pelvic cul-de-sac.  IMPRESSION:  1.  No evidence for ovarian torsion. 2.  Nonspecific diffusely increased blood flow noted to the left adnexa, with a small amount of free fluid at the left adnexa.  Left ovary remains grossly unremarkable in appearance. This could reflect an adjacent mild infectious process; a recently ruptured cyst cannot be excluded. 3.  Small amount of fluid noted along the cervical canal.  Original Report Authenticated By: Tonia Ghent, M.D.   2:46 AM Patient still having significant left lower quadrant pain. Abdomen is soft with left lower quadrant tenderness. We'll repeat dose of Dilaudid  5:01 AM Resting comfortably after fentanyl and Benadryl IV.     Hanley Seamen, MD 30-Nov-2011 [phone]

## 2012-06-02 ENCOUNTER — Ambulatory Visit (INDEPENDENT_AMBULATORY_CARE_PROVIDER_SITE_OTHER): Payer: Self-pay | Admitting: Family Medicine

## 2012-06-02 ENCOUNTER — Encounter: Payer: Self-pay | Admitting: Family Medicine

## 2012-06-02 DIAGNOSIS — Z0000 Encounter for general adult medical examination without abnormal findings: Secondary | ICD-10-CM

## 2012-06-21 ENCOUNTER — Emergency Department (HOSPITAL_COMMUNITY)
Admission: EM | Admit: 2012-06-21 | Discharge: 2012-06-21 | Disposition: A | Discharge status: Home / Self Care | Payer: Self-pay | Attending: Emergency Medicine | Admitting: Emergency Medicine

## 2012-06-21 ENCOUNTER — Encounter (HOSPITAL_COMMUNITY): Payer: Self-pay | Admitting: *Deleted

## 2012-06-21 DIAGNOSIS — H9209 Otalgia, unspecified ear: Secondary | ICD-10-CM | POA: Insufficient documentation

## 2012-06-21 DIAGNOSIS — D573 Sickle-cell trait: Secondary | ICD-10-CM | POA: Insufficient documentation

## 2012-06-21 DIAGNOSIS — J45909 Unspecified asthma, uncomplicated: Secondary | ICD-10-CM | POA: Insufficient documentation

## 2012-06-21 LAB — RAPID STREP SCREEN (MED CTR MEBANE ONLY): Streptococcus, Group A Screen (Direct): NEGATIVE

## 2012-06-21 MED ORDER — AMOXICILLIN-POT CLAVULANATE 250-125 MG PO TABS: 1.0000 | ORAL_TABLET | Freq: Three times a day (TID) | ORAL | Status: AC

## 2012-06-21 MED ORDER — HYDROCODONE-ACETAMINOPHEN 5-325 MG PO TABS: 2.0000 | ORAL_TABLET | ORAL | Status: AC | PRN

## 2012-06-21 NOTE — ED Provider Notes (Signed)
History   This chart was scribed for Nelia Shi, MD by Sofie Rower. The patient was seen in room TR04C/TR04C and the patient's care was started at 5:52 PM     CSN: [id number]  Arrival date & time 06/21/12  1500   None     Chief Complaint  Patient presents with  . Sore Throat  . Otalgia    (Consider location/radiation/quality/duration/timing/severity/associated sxs/prior treatment) Patient is a 26 y.o. female presenting with pharyngitis and ear pain. The history is provided by the patient. No language interpreter was used.  Sore Throat This is a new problem. The current episode started more than 2 days ago. The problem occurs constantly. Pertinent negatives include no chest pain and no shortness of breath. The symptoms are aggravated by swallowing. Nothing relieves the symptoms. She has tried nothing for the symptoms. The treatment provided no relief.  Otalgia This is a new problem. The current episode started yesterday. There is pain in both ears.    Kimberly Guerra is a 26 y.o. female who presents to the Emergency Department complaining of moderate, episodic sore throat onset three days ago with associated symptoms of otalgia, headache, difficulty swallowing, painful swallowing located at the right side. Pt informs the EDP that when she gets a headache it usually becomes worse. Modifying factors include taking cough drops and drinking tea which do not provide relief. Pt has a hx of allergies (one month ago, rhinorrhea, itchy eyes) asthma, migraines for which she takes Imitrex.   Pt denies fever, drainage from ear, sick contacts, cough, dental pain, taking any pain medication, diabetes.     Past Medical History  Diagnosis Date  . Asthma   . Migraine   . Sickle cell trait     Past Surgical History  Procedure Date  . Cesarean section 2010    Family History  Problem Relation Age of Onset  . Hypertension Mother   . Asthma Father   . Hypertension Father   . Asthma  Brother   . Asthma Paternal Aunt   . Diabetes Paternal Aunt   . Cancer Maternal Grandfather     oral cancer  . Diabetes Maternal Grandfather   . Hypertension Maternal Grandfather     History  Substance Use Topics  . Smoking status: Not on file  . Smokeless tobacco: Not on file  . Alcohol Use: No    OB History    Grav Para Term Preterm Abortions TAB SAB Ect Mult Living                  Review of Systems  HENT: Positive for ear pain.   Respiratory: Negative for shortness of breath.   Cardiovascular: Negative for chest pain.  All other systems reviewed and are negative.    Allergies  Review of patient's allergies indicates no known allergies.  Home Medications   Current Outpatient Rx  Name Route Sig Dispense Refill  . ALBUTEROL SULFATE HFA 108 (90 BASE) MCG/ACT IN AERS Inhalation Inhale 2 puffs into the lungs every 6 (six) hours as needed. For wheeze or shortness of breath     . SUMATRIPTAN SUCCINATE 50 MG PO TABS Oral Take 50 mg by mouth every 2 (two) hours as needed. headache    . AMOXICILLIN-POT CLAVULANATE 250-125 MG PO TABS Oral Take 1 tablet by mouth 3 (three) times daily. 21 tablet 0  . HYDROCODONE-ACETAMINOPHEN 5-325 MG PO TABS Oral Take 2 tablets by mouth every 4 (four) hours as needed for pain. 10  tablet 0    BP 128/86  Temp 98.7 F (37.1 C) (Oral)  Resp 18  SpO2 98%  LMP 06/14/2012  Physical Exam  Nursing note and vitals reviewed. Constitutional: She is oriented to person, place, and time. She appears well-developed and well-nourished. No distress.  HENT:  Head: Normocephalic and atraumatic.  Right Ear: Tympanic membrane, external ear and ear canal normal.  Left Ear: Tympanic membrane, external ear and ear canal normal.  Mouth/Throat: Uvula is midline, oropharynx is clear and moist and mucous membranes are normal. No oropharyngeal exudate, posterior oropharyngeal edema, posterior oropharyngeal erythema or tonsillar abscesses.  Eyes: Pupils are equal,  round, and reactive to light.  Neck: Normal range of motion.  Cardiovascular: Normal rate and intact distal pulses.   Pulmonary/Chest: No respiratory distress.  Abdominal: Normal appearance. She exhibits no distension.  Musculoskeletal: Normal range of motion.  Neurological: She is alert and oriented to person, place, and time. No cranial nerve deficit.  Skin: Skin is warm and dry. No rash noted.  Psychiatric: She has a normal mood and affect. Her behavior is normal.    ED Course  Procedures (including critical care time)  DIAGNOSTIC STUDIES: Oxygen Saturation is 98% on room air, normal by my interpretation.    COORDINATION OF CARE:  5:57PM- EDP at bedside discusses treatment plan concerning results of strep test, antibiotics, and pain management.    Results for orders placed during the hospital encounter of 06/21/12  RAPID STREP SCREEN      Component Value Range   Streptococcus, Group A Screen (Direct) NEGATIVE  NEGATIVE    No results found.   1. Otalgia       MDM        I personally performed the services described in this documentation, which was scribed in my presence. The recorded information has been reviewed and considered.     Nelia Shi, MD 06/21/12 [phone]

## 2012-06-21 NOTE — ED Notes (Signed)
Patient with sore throat x 3 days, patient also states yesterday bilateral ear pain began

## 2012-11-28 NOTE — Progress Notes (Signed)
Patient ID: Kimberly Guerra, female   DOB: Aug 15, 1986, 26 y.o.   MRN: [id number]  Patient no-showed new visit. Opened in error.

## 2013-08-28 ENCOUNTER — Ambulatory Visit (INDEPENDENT_AMBULATORY_CARE_PROVIDER_SITE_OTHER): Payer: BC Managed Care – PPO | Admitting: Physician Assistant

## 2013-08-28 VITALS — BP 120/72 | HR 112 | Temp 99.2°F | Resp 16 | Ht <= 58 in | Wt 138.4 lb

## 2013-08-28 DIAGNOSIS — N899 Noninflammatory disorder of vagina, unspecified: Secondary | ICD-10-CM

## 2013-08-28 DIAGNOSIS — N72 Inflammatory disease of cervix uteri: Secondary | ICD-10-CM

## 2013-08-28 DIAGNOSIS — N765 Ulceration of vagina: Secondary | ICD-10-CM

## 2013-08-28 DIAGNOSIS — N898 Other specified noninflammatory disorders of vagina: Secondary | ICD-10-CM

## 2013-08-28 DIAGNOSIS — R350 Frequency of micturition: Secondary | ICD-10-CM

## 2013-08-28 DIAGNOSIS — Z113 Encounter for screening for infections with a predominantly sexual mode of transmission: Secondary | ICD-10-CM

## 2013-08-28 DIAGNOSIS — R3915 Urgency of urination: Secondary | ICD-10-CM

## 2013-08-28 LAB — POCT URINALYSIS DIPSTICK
Bilirubin, UA: NEGATIVE
Glucose, UA: NEGATIVE
Ketones, UA: NEGATIVE
Nitrite, UA: NEGATIVE
pH, UA: 5.5

## 2013-08-28 LAB — POCT WET PREP WITH KOH
Trichomonas, UA: NEGATIVE
Yeast Wet Prep HPF POC: NEGATIVE

## 2013-08-28 LAB — POCT UA - MICROSCOPIC ONLY: Yeast, UA: NEGATIVE

## 2013-08-28 MED ORDER — TRAMADOL HCL 50 MG PO TABS: 50.0000 mg | ORAL_TABLET | Freq: Three times a day (TID) | ORAL | Status: DC | PRN

## 2013-08-28 MED ORDER — VALACYCLOVIR HCL 1 G PO TABS: 1000.0000 mg | ORAL_TABLET | Freq: Two times a day (BID) | ORAL | Status: DC

## 2013-08-28 NOTE — Patient Instructions (Addendum)
Begin taking the valtrex twice daily.  Finish the full course.  Use the tramadol for pain relief if needed.  Drink plenty of fluids and get plenty of rest.  I will let you know when the rest of the labs are back.  Please call me with any questions or concerns

## 2013-08-28 NOTE — Progress Notes (Signed)
Subjective:    Patient ID: Kimberly Guerra, female    DOB: April 20, 1986, 27 y.o.   MRN: [id number]  HPI   [name] is a very pleasant 27 yr old female here with concern for illness.  Reports that she woke up 2 days ago with "irritation down there."  Has a hx of BV and yeast, so tried some monistat with no relief.  Noted some burning with urination that day.  She examined herself and noted some increased redness but was not concerned.  Yesterday she continued to have irritation and burning with urination.  Tried monistat again.  Again examined herself this time noticing "white spots" that made her very nervous.  She denies vaginal itching or vaginal discharge.  Endorses some urinary frequency but not a lot of urine comes out when she goes.  Admits she's trying not to drink water because it's so uncomfortable to urinate.  She denies hematuria.  Denies belly pain, back pain, NV, FC.    She is sexually acitve "here and there."  Last intercourse 1 wk ago.  Has used condoms 100% of the time for the past 9 months - was in a relationship before that and was not using protection.  She would like STI testing today.  Last tested about 1 yr ago - all neg.    LMP 08/02/13, regular periods every month.  Not using contraception aside from condoms.   Review of Systems  Constitutional: Negative.   HENT: Negative.   Respiratory: Negative.   Cardiovascular: Negative.   Gastrointestinal: Negative.   Genitourinary: Positive for dysuria, frequency and genital sores. Negative for hematuria, flank pain and vaginal discharge.  Musculoskeletal: Negative.   Skin: Negative.   Neurological: Negative.        Objective:   Physical Exam  Vitals reviewed. Constitutional: She is oriented to person, place, and time. She appears well-developed and well-nourished. No distress.  HENT:  Head: Normocephalic and atraumatic.  Eyes: Conjunctivae are normal. No scleral icterus.  Cardiovascular: Normal rate, regular rhythm and  normal heart sounds.   Pulmonary/Chest: Effort normal and breath sounds normal. She has no rales.  Abdominal: Soft. Bowel sounds are normal. There is no tenderness.  Genitourinary: Uterus normal.    There is tenderness and lesion on the right labia. There is tenderness and lesion on the left labia. Cervix exhibits no motion tenderness. Right adnexum displays no mass, no tenderness and no fullness. Left adnexum displays no mass, no tenderness and no fullness. Vaginal discharge (thick, white) found.  Multiple ulcerations at vaginal introitus - erythematous with white bases, painful to the touch, appear consistent with HSV lesions  Neurological: She is alert and oriented to person, place, and time.  Skin: Skin is warm and dry.  Psychiatric: She has a normal mood and affect. Her behavior is normal.    Results for orders placed in visit on 08/28/13  POCT UA - MICROSCOPIC ONLY      Result Value Range   WBC, Ur, HPF, POC 15-20 w/clums     RBC, urine, microscopic 3-8     Bacteria, U Microscopic 1+     Mucus, UA positive     Epithelial cells, urine per micros 0-3     Crystals, Ur, HPF, POC neg     Casts, Ur, LPF, POC neg     Yeast, UA neg    POCT URINALYSIS DIPSTICK      Result Value Range   Color, UA yellow     Clarity, UA cloudy  Glucose, UA neg     Bilirubin, UA neg     Ketones, UA neg     Spec Grav, UA 1.025     Blood, UA small     pH, UA 5.5     Protein, UA neg     Urobilinogen, UA 0.2     Nitrite, UA neg     Leukocytes, UA large (3+)    POCT WET PREP WITH KOH      Result Value Range   Trichomonas, UA Negative     Clue Cells Wet Prep HPF POC 1-2     Epithelial Wet Prep HPF POC 5-10     Yeast Wet Prep HPF POC neg     Bacteria Wet Prep HPF POC moderate     RBC Wet Prep HPF POC neg     WBC Wet Prep HPF POC 10-15     KOH Prep POC Negative          Assessment & Plan:  Vaginal irritation - Plan: POCT Wet Prep with KOH, Herpes simplex virus culture, GC/Chlamydia Probe  Amp  Vaginal ulcer - Plan: Herpes simplex virus culture, valACYclovir (VALTREX) 1000 MG tablet, traMADol (ULTRAM) 50 MG tablet  Screen for STD (sexually transmitted disease) - Plan: RPR, HIV antibody, GC/Chlamydia Probe Amp  Urinary frequency - Plan: POCT UA - Microscopic Only, POCT urinalysis dipstick, Urine culture  Urinary urgency - Plan: POCT UA - Microscopic Only, POCT urinalysis dipstick    [name] is a very pleasant 27 yr old female here with dysuria and vaginal ulcerations.  Exam reveals multiple ulcers at vaginal introitus - appear c/w HSV lesions.  HSV cx sent.  Pt with no hx of HSV.  UA shows 3+ leuks but is otherwise not very impressive for UTI.  Question whether the burning with urination is actually external irritation of HSV lesions.  Wet prep reveals a few clue cells, but again exam is more impressive than wet prep as the cause of her symptoms.  At this point would favor treating likely HSV first.  If sxs persist would consider tx for BV.  Discussed my concern for HSV with the pt who was very upset.  I would like to start Valtrex today, and pt agrees with this.  Explained that cx will give [place] definitive answer on lesions but HSV is at the top of ddx.  Answered pt questions and discussed possibility of suppressive therapy in the future.  Tramadol for pain relief as lesions are very uncomfortable at this point.  Encouraged pt to contact me with further questions or concerns.  Will send urine cx to r/o UTI.  Genprobe, HIV, and RPR all pending.  Will follow up on labs and treat further if necessary.

## 2013-08-28 NOTE — Progress Notes (Deleted)
  Subjective:    Patient ID: Kimberly Guerra, female    DOB: 23-Sep-1986, 27 y.o.   MRN: [id number]  HPI    Review of Systems Results for orders placed in visit on 08/28/13  POCT UA - MICROSCOPIC ONLY      Result Value Range   WBC, Ur, HPF, POC 15-20 w/clums     RBC, urine, microscopic 3-8     Bacteria, U Microscopic 1+     Mucus, UA positive     Epithelial cells, urine per micros 0-3     Crystals, Ur, HPF, POC neg     Casts, Ur, LPF, POC neg     Yeast, UA neg    POCT URINALYSIS DIPSTICK      Result Value Range   Color, UA yellow     Clarity, UA cloudy     Glucose, UA neg     Bilirubin, UA neg     Ketones, UA neg     Spec Grav, UA 1.025     Blood, UA small     pH, UA 5.5     Protein, UA neg     Urobilinogen, UA 0.2     Nitrite, UA neg     Leukocytes, UA large (3+)         Objective:   Physical Exam        Assessment & Plan:

## 2013-08-29 LAB — HIV ANTIBODY (ROUTINE TESTING W REFLEX): HIV: NONREACTIVE

## 2013-08-30 LAB — GC/CHLAMYDIA PROBE AMP: CT Probe RNA: NEGATIVE

## 2013-08-31 ENCOUNTER — Telehealth: Payer: Self-pay | Admitting: Radiology

## 2013-08-31 LAB — HERPES SIMPLEX VIRUS CULTURE: Organism ID, Bacteria: NOT DETECTED

## 2013-08-31 LAB — URINE CULTURE: Colony Count: NO GROWTH

## 2013-08-31 NOTE — Telephone Encounter (Signed)
Thanks pt advised she is better, if the area recurs she will come back in.

## 2013-08-31 NOTE — Telephone Encounter (Signed)
Based on symptoms, I would encourage pt to finish full course of Valtrex.  Are her symptoms improving?  If not, would advise her to RTC

## 2013-08-31 NOTE — Telephone Encounter (Signed)
Patient advised of normal lab results she wants to know if she should continue the Valtrex please advise.

## 2013-10-05 ENCOUNTER — Emergency Department (HOSPITAL_COMMUNITY)
Admission: EM | Admit: 2013-10-05 | Discharge: 2013-10-06 | Disposition: A | Discharge status: Home / Self Care | Payer: BC Managed Care – PPO | Attending: Emergency Medicine | Admitting: Emergency Medicine

## 2013-10-05 ENCOUNTER — Encounter (HOSPITAL_COMMUNITY): Payer: Self-pay | Admitting: Emergency Medicine

## 2013-10-05 ENCOUNTER — Emergency Department (HOSPITAL_COMMUNITY): Payer: BC Managed Care – PPO

## 2013-10-05 DIAGNOSIS — J45901 Unspecified asthma with (acute) exacerbation: Secondary | ICD-10-CM | POA: Insufficient documentation

## 2013-10-05 DIAGNOSIS — Z8679 Personal history of other diseases of the circulatory system: Secondary | ICD-10-CM | POA: Insufficient documentation

## 2013-10-05 DIAGNOSIS — Z862 Personal history of diseases of the blood and blood-forming organs and certain disorders involving the immune mechanism: Secondary | ICD-10-CM | POA: Insufficient documentation

## 2013-10-05 DIAGNOSIS — Z79899 Other long term (current) drug therapy: Secondary | ICD-10-CM | POA: Insufficient documentation

## 2013-10-05 LAB — CBC
HCT: 33.9 % — ABNORMAL LOW (ref 36.0–46.0)
MCHC: 34.8 g/dL (ref 30.0–36.0)
MCV: 76.2 fL — ABNORMAL LOW (ref 78.0–100.0)
Platelets: 415 10*3/uL — ABNORMAL HIGH (ref 150–400)
RDW: 14.6 % (ref 11.5–15.5)

## 2013-10-05 LAB — BASIC METABOLIC PANEL
BUN: 10 mg/dL (ref 6–23)
Calcium: 9.6 mg/dL (ref 8.4–10.5)
Chloride: 102 mEq/L (ref 96–112)
Creatinine, Ser: 0.68 mg/dL (ref 0.50–1.10)
GFR calc Af Amer: >90 mL/min (ref >90)

## 2013-10-05 MED ORDER — IPRATROPIUM BROMIDE 0.02 % IN SOLN
0.5000 mg | Freq: Once | RESPIRATORY_TRACT | Status: AC
  Administered 2013-10-05: 0.5 mg via RESPIRATORY_TRACT

## 2013-10-05 MED ORDER — IPRATROPIUM BROMIDE 0.02 % IN SOLN
RESPIRATORY_TRACT | Status: AC
  Filled 2013-10-05: qty 2.5

## 2013-10-05 MED ORDER — ALBUTEROL (5 MG/ML) CONTINUOUS INHALATION SOLN
INHALATION_SOLUTION | RESPIRATORY_TRACT | Status: AC
  Administered 2013-10-05: 22:00:00
  Filled 2013-10-05: qty 20

## 2013-10-05 MED ORDER — IPRATROPIUM BROMIDE 0.02 % IN SOLN
RESPIRATORY_TRACT | Status: AC
  Administered 2013-10-05: 0.5 mg via RESPIRATORY_TRACT
  Filled 2013-10-05: qty 2.5

## 2013-10-05 MED ORDER — ALBUTEROL SULFATE (5 MG/ML) 0.5% IN NEBU
2.5000 mg | INHALATION_SOLUTION | Freq: Once | RESPIRATORY_TRACT | Status: AC
  Administered 2013-10-05: 5 mg via RESPIRATORY_TRACT

## 2013-10-05 MED ORDER — ALBUTEROL SULFATE (5 MG/ML) 0.5% IN NEBU
INHALATION_SOLUTION | RESPIRATORY_TRACT | Status: AC
  Filled 2013-10-05: qty 0.5

## 2013-10-05 MED ORDER — ALBUTEROL SULFATE (5 MG/ML) 0.5% IN NEBU
INHALATION_SOLUTION | RESPIRATORY_TRACT | Status: AC
  Filled 2013-10-05: qty 1

## 2013-10-05 MED ORDER — DEXAMETHASONE 6 MG PO TABS
10.0000 mg | ORAL_TABLET | Freq: Once | ORAL | Status: AC
  Administered 2013-10-05: 10 mg via ORAL
  Filled 2013-10-05: qty 1

## 2013-10-05 MED ORDER — ALBUTEROL SULFATE (5 MG/ML) 0.5% IN NEBU
2.5000 mg | INHALATION_SOLUTION | Freq: Once | RESPIRATORY_TRACT | Status: AC
  Administered 2013-10-05: 2.5 mg via RESPIRATORY_TRACT

## 2013-10-05 NOTE — ED Notes (Addendum)
Patient with history of asthma, comes to window with audible wheezing and shortness of breath with dry cough.  Patient speaking in one to two word sentences.  Patient is CAOx3 at this time.

## 2013-10-05 NOTE — ED Provider Notes (Signed)
CSN: [id number]     Arrival date & time 10/05/13  2111 History   First MD Initiated Contact with Patient 10/05/13 2146     Chief Complaint  Patient presents with  . Shortness of Breath   (Consider location/radiation/quality/duration/timing/severity/associated sxs/prior Treatment) Patient is a 27 y.o. female presenting with shortness of breath.  Shortness of Breath Severity:  Severe Onset quality:  Gradual Duration:  2 days Timing:  Constant Progression:  Worsening Chronicity:  Recurrent Context: URI   Relieved by:  None tried Worsened by:  Deep breathing Ineffective treatments:  None tried Associated symptoms: cough (white productive), sputum production and wheezing   Associated symptoms: no abdominal pain, no chest pain, no fever, no headaches, no rash, no sore throat and no vomiting   Risk factors comment:  Ho asthma   Past Medical History  Diagnosis Date  . Asthma   . Migraine   . Sickle cell trait    Past Surgical History  Procedure Laterality Date  . Cesarean section  2010   Family History  Problem Relation Age of Onset  . Hypertension Mother   . Asthma Father   . Hypertension Father   . Asthma Brother   . Asthma Paternal Aunt   . Diabetes Paternal Aunt   . Cancer Maternal Grandfather     oral cancer  . Diabetes Maternal Grandfather   . Hypertension Maternal Grandfather    History  Substance Use Topics  . Smoking status: Never Smoker   . Smokeless tobacco: Not on file  . Alcohol Use: No   OB History   Grav Para Term Preterm Abortions TAB SAB Ect Mult Living                 Review of Systems  Constitutional: Negative for fever and chills.  HENT: Negative for congestion, rhinorrhea and sore throat.   Eyes: Negative for photophobia and visual disturbance.  Respiratory: Positive for cough (white productive), sputum production, shortness of breath and wheezing.   Cardiovascular: Negative for chest pain and leg swelling.  Gastrointestinal: Negative for  nausea, vomiting, abdominal pain, diarrhea and constipation.  Endocrine: Negative for polyphagia and polyuria.  Genitourinary: Negative for dysuria, flank pain, vaginal bleeding, vaginal discharge and enuresis.  Musculoskeletal: Negative for back pain and gait problem.  Skin: Negative for color change and rash.  Neurological: Negative for dizziness, syncope, light-headedness, numbness and headaches.  Hematological: Negative for adenopathy. Does not bruise/bleed easily.  All other systems reviewed and are negative.    Allergies  Review of patient's allergies indicates no known allergies.  Home Medications   Current Outpatient Rx  Name  Route  Sig  Dispense  Refill  . albuterol (PROVENTIL HFA;VENTOLIN HFA) 108 (90 BASE) MCG/ACT inhaler   Inhalation   Inhale 2 puffs into the lungs every 6 (six) hours as needed. For wheeze or shortness of breath          . albuterol (PROVENTIL HFA;VENTOLIN HFA) 108 (90 BASE) MCG/ACT inhaler   Inhalation   Inhale 1-2 puffs into the lungs every 6 (six) hours as needed for wheezing.   1 Inhaler   0    BP 153/83  Pulse 116  Resp 11  Ht 4\' 10"  (1.473 m)  Wt 138 lb (62.596 kg)  BMI 28.85 kg/m2  SpO2 100%  LMP 09/24/2013 Physical Exam  Vitals reviewed. Constitutional: She is oriented to person, place, and time. She appears well-developed and well-nourished.  HENT:  Head: Normocephalic and atraumatic.  Right Ear: External ear  normal.  Left Ear: External ear normal.  Eyes: Conjunctivae and EOM are normal. Pupils are equal, round, and reactive to light.  Neck: Normal range of motion. Neck supple.  Cardiovascular: Normal rate, regular rhythm, normal heart sounds and intact distal pulses.   Pulmonary/Chest: Effort normal. She has wheezes (mild diffuse).  Abdominal: Soft. Bowel sounds are normal. There is no tenderness.  Musculoskeletal: Normal range of motion.  Neurological: She is alert and oriented to person, place, and time.  Skin: Skin is  warm and dry.    ED Course  Procedures (including critical care time) Labs Review Labs Reviewed  CBC - Abnormal; Notable for the following:    Hemoglobin 11.8 (*)    HCT 33.9 (*)    MCV 76.2 (*)    Platelets 415 (*)    All other components within normal limits  BASIC METABOLIC PANEL   Results for orders placed during the hospital encounter of 10/05/13  BASIC METABOLIC PANEL      Result Value Range   Sodium 140  135 - 145 mEq/L   Potassium 3.8  3.5 - 5.1 mEq/L   Chloride 102  96 - 112 mEq/L   CO2 24  19 - 32 mEq/L   Glucose, Bld 92  70 - 99 mg/dL   BUN 10  6 - 23 mg/dL   Creatinine, Ser 1.91  0.50 - 1.10 mg/dL   Calcium 9.6  8.4 - 47.8 mg/dL   GFR calc non Af Amer >90  >90 mL/min   GFR calc Af Amer >90  >90 mL/min  CBC      Result Value Range   WBC 9.8  4.0 - 10.5 K/uL   RBC 4.45  3.87 - 5.11 MIL/uL   Hemoglobin 11.8 (*) 12.0 - 15.0 g/dL   HCT 29.5 (*) 62.1 - 30.8 %   MCV 76.2 (*) 78.0 - 100.0 fL   MCH 26.5  26.0 - 34.0 pg   MCHC 34.8  30.0 - 36.0 g/dL   RDW 65.7  84.6 - 96.2 %   Platelets 415 (*) 150 - 400 K/uL    Imaging Review Dg Chest 2 View (if Patient Has Fever And/or Copd)  10/05/2013   *RADIOLOGY REPORT*  Clinical Data: Shortness of breath, history of asthma.  CHEST - 2 VIEW  Comparison: None.  Findings: The lungs are well-aerated and clear.  There is no evidence of focal opacification, pleural effusion or pneumothorax.  The heart is normal in size; the mediastinal contour is within normal limits.  No acute osseous abnormalities are seen.  IMPRESSION: No acute cardiopulmonary process seen.   Original Report Authenticated By: Tonia Ghent, M.D.    EKG Interpretation   None       MDM   1. Asthma attack    27 y.o. female  with pertinent PMH of asthma presents with shortness of breath x2 days. Patient states that symptoms began yesterday after she was exposed to her son who was diagnosed recently with a viral upper for infection. Patient has had cough  productive of white sputum however denies fever nausea vomiting or other infectious symptoms. His exam on arrival with mild diffuse wheezing. Vitals with tachycardia however O2 sat 100% on room air.  Pt given DuoNeb and Decadron.  Symptoms improved.  Pt has quieted voice, however states that current symptoms are identical to prior asthma attacks and denies fever or other systemic symptoms.  CXR as above unremarkable.  Stable to dc home with PCP fu.  Doubt PNA, PTX, PE (no leg swelling, estrogen use, hemoptysis), or other emergent etiology.    Labs and imaging as above reviewed by myself and attending,Dr. Romeo Apple, with whom case was discussed.   1. Asthma attack         Noel Gerold, MD 10/06/13 0010

## 2013-10-05 NOTE — ED Notes (Signed)
Harrison, MD at bedside. 

## 2013-10-06 MED ORDER — BENZONATATE 100 MG PO CAPS: 100.0000 mg | ORAL_CAPSULE | Freq: Three times a day (TID) | ORAL | Status: DC

## 2013-10-06 MED ORDER — ALBUTEROL SULFATE HFA 108 (90 BASE) MCG/ACT IN AERS: 1.0000 | INHALATION_SPRAY | Freq: Four times a day (QID) | RESPIRATORY_TRACT | Status: DC | PRN

## 2013-10-06 NOTE — ED Provider Notes (Signed)
Medical screening examination/treatment/procedure(s) were conducted as a shared visit with resident physician and myself.  I personally evaluated the patient during the encounter.  I interviewed and examined the patient. Lungs w/ dec air movement and mild wheeze bilaterally. Cardiac exam wnl. Abdomen soft. Will give breathing tx, steroids.   Pt much improved on exam, will d/c home.   Junius Argyle, MD 10/06/13 1225

## 2013-11-02 ENCOUNTER — Other Ambulatory Visit: Payer: Self-pay

## 2013-12-06 ENCOUNTER — Ambulatory Visit (INDEPENDENT_AMBULATORY_CARE_PROVIDER_SITE_OTHER): Payer: BC Managed Care – PPO | Admitting: Family Medicine

## 2013-12-06 VITALS — BP 130/80 | HR 70 | Temp 98.5°F | Resp 16 | Ht <= 58 in | Wt 141.0 lb

## 2013-12-06 DIAGNOSIS — R112 Nausea with vomiting, unspecified: Secondary | ICD-10-CM

## 2013-12-06 DIAGNOSIS — R05 Cough: Secondary | ICD-10-CM

## 2013-12-06 LAB — POCT CBC
Granulocyte percent: 65.5 %G (ref 37–80)
MID (cbc): 0.3 (ref 0–0.9)
MPV: 8.4 fL (ref 0–99.8)
POC Granulocyte: 4.3 (ref 2–6.9)
POC LYMPH PERCENT: 29.5 %L (ref 10–50)
Platelet Count, POC: 391 10*3/uL (ref 142–424)
RDW, POC: 16.3 %

## 2013-12-06 LAB — BASIC METABOLIC PANEL
Potassium: 4.8 mEq/L (ref 3.5–5.3)
Sodium: 137 mEq/L (ref 135–145)

## 2013-12-06 LAB — POCT INFLUENZA A/B: Influenza B, POC: NEGATIVE

## 2013-12-06 MED ORDER — OSELTAMIVIR PHOSPHATE 75 MG PO CAPS: 75.0000 mg | ORAL_CAPSULE | Freq: Two times a day (BID) | ORAL | Status: DC

## 2013-12-06 MED ORDER — ONDANSETRON HCL 8 MG PO TABS: 8.0000 mg | ORAL_TABLET | Freq: Three times a day (TID) | ORAL | Status: DC | PRN

## 2013-12-06 MED ORDER — HYDROCODONE-HOMATROPINE 5-1.5 MG/5ML PO SYRP: 5.0000 mL | ORAL_SOLUTION | Freq: Three times a day (TID) | ORAL | Status: DC | PRN

## 2013-12-06 NOTE — Progress Notes (Signed)
[facility] [address] [place] [address] [phone]  Date:  12/06/2013   Name:  Kimberly Guerra   DOB:  February 25, 1986   MRN:  [id number]  PCP:  No PCP Per Patient    Chief Complaint: Sore Throat and Cough   History of Present Illness:  Kimberly Guerra is a 27 y.o. very pleasant female patient who presents with the following:  Here today with illness.  Yesterday she awoke with a cough.  She is also vomiting.  She has both post- tussive and spontaneous emesis.   She also noted a ST and headache. No diarrhea. She is not sure if she might have had a fever.  She does have aches.   She also has chills.   She notes some runny and stuffy nose.   No abdominal pain LMP was 2 weeks ago.    She is generally healthy except for migaine HA.  She occasionally will have asthma sx.    So far today she has used her albuterol, tessalon perles. She has not taken any antipyretics so far today.   She also notes that her chest and back hurt when she coughs.    There are no active problems to display for this patient.   Past Medical History  Diagnosis Date  . Asthma   . Migraine   . Sickle cell trait     Past Surgical History  Procedure Laterality Date  . Cesarean section  2010    History  Substance Use Topics  . Smoking status: Never Smoker   . Smokeless tobacco: Not on file  . Alcohol Use: No    Family History  Problem Relation Age of Onset  . Hypertension Mother   . Asthma Father   . Hypertension Father   . Asthma Brother   . Asthma Paternal Aunt   . Diabetes Paternal Aunt   . Cancer Maternal Grandfather     oral cancer  . Diabetes Maternal Grandfather   . Hypertension Maternal Grandfather     No Known Allergies  Medication list has been reviewed and updated.  Current Outpatient Prescriptions on File Prior to Visit  Medication Sig Dispense Refill  . albuterol (PROVENTIL HFA;VENTOLIN HFA) 108 (90 BASE) MCG/ACT inhaler Inhale 2 puffs  into the lungs every 6 (six) hours as needed. For wheeze or shortness of breath       . benzonatate (TESSALON) 100 MG capsule Take 1 capsule (100 mg total) by mouth every 8 (eight) hours.  21 capsule  0   No current facility-administered medications on file prior to visit.    Review of Systems:  As per HPI- otherwise negative.   Physical Examination: Filed Vitals:   12/06/13 1420  BP: 130/80  Pulse: 100  Temp: 98.5 F (36.9 C)  Resp: 16   Filed Vitals:   12/06/13 1420  Height: 4' 9.5" (1.461 m)  Weight: 141 lb (63.957 kg)   Body mass index is 29.96 kg/(m^2). Ideal Body Weight: Weight in (lb) to have BMI = 25: 117.3  GEN: WDWN, NAD, Non-toxic, A & O x 3, some cough in room HEENT: Atraumatic, Normocephalic. Neck supple. No masses, No LAD.  Bilateral TM wnl, oropharynx normal.  PEERL,EOMI.   Ears and Nose: No external deformity. CV: RRR, No M/G/R. No JVD. No thrill. No extra heart sounds. PULM: CTA B, no wheezes, crackles, rhonchi. No retractions. No resp. distress. No accessory muscle use. ABD: S, NT, ND, +BS. No rebound. No HSM. EXTR:  No c/c/e NEURO Normal gait.  PSYCH: Normally interactive. Conversant. Not depressed or anxious appearing.  Calm demeanor.  Chest and back pain are reproducible by pressing on chest wall.  She states this is the same pain she has noted at home.  Results for orders placed in visit on 12/06/13  POCT INFLUENZA A/B      Result Value Range   Influenza A, POC Negative     Influenza B, POC Negative    POCT RAPID STREP A (OFFICE)      Result Value Range   Rapid Strep A Screen Negative  Negative  POCT CBC      Result Value Range   WBC 6.6  4.6 - 10.2 K/uL   Lymph, poc 1.9  0.6 - 3.4   POC LYMPH PERCENT 29.5  10 - 50 %L   MID (cbc) 0.3  0 - 0.9   POC MID % 5.0  0 - 12 %M   POC Granulocyte 4.3  2 - 6.9   Granulocyte percent 65.5  37 - 80 %G   RBC 4.93  4.04 - 5.48 M/uL   Hemoglobin 12.6  12.2 - 16.2 g/dL   HCT, POC 91.4  78.2 - 47.9 %   MCV  83.2  80 - 97 fL   MCH, POC 25.6 (*) 27 - 31.2 pg   MCHC 30.7 (*) 31.8 - 35.4 g/dL   RDW, POC 95.6     Platelet Count, POC 391  142 - 424 K/uL   MPV 8.4  0 - 99.8 fL      Assessment and Plan: Cough - Plan: POCT Influenza A/B, HYDROcodone-homatropine (HYCODAN) 5-1.5 MG/5ML syrup, POCT CBC, Basic metabolic panel, oseltamivir (TAMIFLU) 75 MG capsule  Nausea with vomiting - Plan: POCT rapid strep A, ondansetron (ZOFRAN) 8 MG tablet  Possible flu with negative test.  She would like to take tamiflu if there is any chance that she has flu.   Tamiflu, hycodan, zofran as needed.   Push fluids and rest See patient instructions for more details.   Will plan further follow- up pending labs. She is to call if any worsening or other concerns   Signed Abbe Amsterdam, MD

## 2013-12-06 NOTE — Patient Instructions (Addendum)
Use the zofran as needed for nausea.  Use the hycodan cough syrup as needed but be cautious of sedation.    Use the tamiflu twice a day for 5 days.  Let me know if you are getting worse or are not getting better in the next couple of days.

## 2014-01-31 ENCOUNTER — Ambulatory Visit (INDEPENDENT_AMBULATORY_CARE_PROVIDER_SITE_OTHER): Payer: BC Managed Care – PPO | Admitting: Emergency Medicine

## 2014-01-31 VITALS — BP 124/82 | HR 70 | Temp 98.0°F | Resp 17 | Ht <= 58 in | Wt 141.0 lb

## 2014-01-31 DIAGNOSIS — G43109 Migraine with aura, not intractable, without status migrainosus: Secondary | ICD-10-CM

## 2014-01-31 MED ORDER — KETOROLAC TROMETHAMINE 60 MG/2ML IM SOLN: 60.0000 mg | Freq: Once | INTRAMUSCULAR | Status: DC

## 2014-01-31 MED ORDER — KETOROLAC TROMETHAMINE 30 MG/ML IJ SOLN
30.0000 mg | Freq: Once | INTRAMUSCULAR | Status: AC
  Administered 2014-01-31: 30 mg via INTRAMUSCULAR

## 2014-01-31 MED ORDER — PROMETHAZINE HCL 25 MG/ML IJ SOLN
25.0000 mg | Freq: Once | INTRAMUSCULAR | Status: AC
  Administered 2014-01-31: 25 mg via INTRAMUSCULAR

## 2014-01-31 MED ORDER — ELETRIPTAN HYDROBROMIDE 40 MG PO TABS: ORAL_TABLET | ORAL | Status: DC

## 2014-01-31 NOTE — Patient Instructions (Addendum)
Migraine Headache A migraine headache is an intense, throbbing pain on one or both sides of your head. A migraine can last for 30 minutes to several hours. CAUSES  The exact cause of a migraine headache is not always known. However, a migraine may be caused when nerves in the brain become irritated and release chemicals that cause inflammation. This causes pain. Certain things may also trigger migraines, such as:  Alcohol.  Smoking.  Stress.  Menstruation.  Aged cheeses.  Foods or drinks that contain nitrates, glutamate, aspartame, or tyramine.  Lack of sleep.  Chocolate.  Caffeine.  Hunger.  Physical exertion.  Fatigue.  Medicines used to treat chest pain (nitroglycerine), birth control pills, estrogen, and some blood pressure medicines. SIGNS AND SYMPTOMS  Pain on one or both sides of your head.  Pulsating or throbbing pain.  Severe pain that prevents daily activities.  Pain that is aggravated by any physical activity.  Nausea, vomiting, or both.  Dizziness.  Pain with exposure to bright lights, loud noises, or activity.  General sensitivity to bright lights, loud noises, or smells. Before you get a migraine, you may get warning signs that a migraine is coming (aura). An aura may include:  Seeing flashing lights.  Seeing bright spots, halos, or zig-zag lines.  Having tunnel vision or blurred vision.  Having feelings of numbness or tingling.  Having trouble talking.  Having muscle weakness. DIAGNOSIS  A migraine headache is often diagnosed based on:  Symptoms.  Physical exam.  A CT scan or MRI of your head. These imaging tests cannot diagnose migraines, but they can help rule out other causes of headaches. TREATMENT Medicines may be given for pain and nausea. Medicines can also be given to help prevent recurrent migraines.  HOME CARE INSTRUCTIONS  Only take over-the-counter or prescription medicines for pain or discomfort as directed by your  health care provider. The use of long-term narcotics is not recommended.  Lie down in a dark, quiet room when you have a migraine.  Keep a journal to find out what may trigger your migraine headaches. For example, write down:  What you eat and drink.  How much sleep you get.  Any change to your diet or medicines.  Limit alcohol consumption.  Quit smoking if you smoke.  Get 7 9 hours of sleep, or as recommended by your health care provider.  Limit stress.  Keep lights dim if bright lights bother you and make your migraines worse. SEEK IMMEDIATE MEDICAL CARE IF:   Your migraine becomes severe.  You have a fever.  You have a stiff neck.  You have vision loss.  You have muscular weakness or loss of muscle control.  You start losing your balance or have trouble walking.  You feel faint or pass out.  You have severe symptoms that are different from your first symptoms. MAKE SURE YOU:   Understand these instructions.  Will watch your condition.  Will get help right away if you are not doing well or get worse. Document Released: 12/14/2005 Document Revised: 10/04/2013 Document Reviewed: 08/21/2013 [employer] Patient Information 2014 [employer].  

## 2014-01-31 NOTE — Progress Notes (Signed)
[facility] [address], [place] [address] [phone]  Date:  01/31/2014   Name:  Kimberly Guerra   DOB:  February 04, 1986   MRN:  [id number]  PCP:  No PCP Per Patient    Chief Complaint: Migraine and Eye Pain   History of Present Illness:  Kimberly Guerra is a 28 y.o. very pleasant female patient who presents with the following:  Patient with a history of migraines.  Was seeing a doctor at a [facility]  But stopped because she didn't like the shots he gave her in the head.   She is not on a preventative and has never tried anything but imitrex and said it did not work.  She stopped topamax as her last headache was in the summer.  She says this headache started yesterday at work and has been present since.  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  Says she has frequent rebound headache with pain medication.  No improvement with over the counter medications or other home remedies. Denies other complaint or health concern today.   There are no active problems to display for this patient.   Past Medical History  Diagnosis Date  . Asthma   . Migraine   . Sickle cell trait     Past Surgical History  Procedure Laterality Date  . Cesarean section  2010    History  Substance Use Topics  . Smoking status: Never Smoker   . Smokeless tobacco: Not on file  . Alcohol Use: No    Family History  Problem Relation Age of Onset  . Hypertension Mother   . Asthma Father   . Hypertension Father   . Asthma Brother   . Asthma Paternal Aunt   . Diabetes Paternal Aunt   . Cancer Maternal Grandfather     oral cancer  . Diabetes Maternal Grandfather   . Hypertension Maternal Grandfather     No Known Allergies  Medication list has been reviewed and updated.  Current Outpatient Prescriptions on File Prior to Visit  Medication Sig Dispense Refill  . albuterol (PROVENTIL HFA;VENTOLIN HFA) 108 (90 BASE) MCG/ACT inhaler Inhale 2  puffs into the lungs every 6 (six) hours as needed. For wheeze or shortness of breath        No current facility-administered medications on file prior to visit.    Review of Systems:  As per HPI, otherwise negative.    Physical Examination: Filed Vitals:   01/31/14 1044  BP: 124/82  Pulse: 70  Temp: 98 F (36.7 C)  Resp: 17   Filed Vitals:   01/31/14 1044  Height: 4' 9.5" (1.461 m)  Weight: 141 lb (63.957 kg)   Body mass index is 29.96 kg/(m^2). Ideal Body Weight: Weight in (lb) to have BMI = 25: 117.3  GEN: WDWN, moderate distress, Non-toxic, A & O x 3 HEENT: Atraumatic, Normocephalic. Neck supple. No masses, No LAD. Ears and Nose: No external deformity. CV: RRR, No M/G/R. No JVD. No thrill. No extra heart sounds. PULM: CTA B, no wheezes, crackles, rhonchi. No retractions. No resp. distress. No accessory muscle use. ABD: S, NT, ND, +BS. No rebound. No HSM. EXTR: No c/c/e NEURO Normal gait.  PSYCH: Normally interactive. Conversant. Not depressed or anxious appearing.  Calm demeanor.    Assessment and Plan: Migraine Modest relief with meds relpax Refer neurology   Signed,  Ellison Carwin, MD

## 2014-02-01 ENCOUNTER — Telehealth: Payer: Self-pay

## 2014-02-01 NOTE — Telephone Encounter (Signed)
Patient called and said her pharmacy told her that the Relpax 40mg  needs prior authorization. [place]  [phone]

## 2014-02-02 NOTE — Telephone Encounter (Signed)
10 is fine, thanks

## 2014-02-02 NOTE — Telephone Encounter (Signed)
Tried to do PA and ins advised that they do not cover this med, can not even do a PA. Their preferred alternatives are sumatriptan and rizatriptan. 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 Dr Ouida Sills, do you want to Rx Rizatriptan?

## 2014-02-02 NOTE — Telephone Encounter (Signed)
Dr Ouida Sills, I wasn't sure if you wanted to Rx the 5 mg or 10 mg. I have pended the 10 mg. Please review and advise if OK.

## 2014-02-02 NOTE — Telephone Encounter (Signed)
Sure that would be great.  Please send it. Thanks

## 2014-02-05 MED ORDER — RIZATRIPTAN BENZOATE 10 MG PO TABS: ORAL_TABLET | ORAL | Status: DC

## 2014-02-05 NOTE — Telephone Encounter (Signed)
lmom that alternative was sent in.

## 2014-02-08 ENCOUNTER — Ambulatory Visit (INDEPENDENT_AMBULATORY_CARE_PROVIDER_SITE_OTHER): Payer: BC Managed Care – PPO | Admitting: Neurology

## 2014-02-08 ENCOUNTER — Encounter: Payer: Self-pay | Admitting: Neurology

## 2014-02-08 VITALS — BP 124/79 | HR 79 | Ht 58.5 in | Wt 143.0 lb

## 2014-02-08 DIAGNOSIS — G43119 Migraine with aura, intractable, without status migrainosus: Secondary | ICD-10-CM

## 2014-02-08 DIAGNOSIS — G43909 Migraine, unspecified, not intractable, without status migrainosus: Secondary | ICD-10-CM | POA: Insufficient documentation

## 2014-02-08 MED ORDER — GABAPENTIN 100 MG PO CAPS: ORAL_CAPSULE | ORAL | Status: DC

## 2014-02-08 MED ORDER — PROMETHAZINE HCL 12.5 MG PO TABS: 12.5000 mg | ORAL_TABLET | Freq: Three times a day (TID) | ORAL | Status: DC | PRN

## 2014-02-08 NOTE — Patient Instructions (Addendum)
Please remember, common headache triggers are: sleep deprivation, dehydration, overheating, stress, hypoglycemia or skipping meals and blood sugar fluctuations, excessive pain medications or excessive alcohol use or caffeine withdrawal. Some people have food triggers such as aged cheese, orange juice or chocolate, especially dark chocolate, or MSG (monosodium glutamate). Try to avoid these headache triggers as much possible. It may be helpful to keep a headache diary to figure out what makes your headaches worse or brings them on and what alleviates them. Some people report headache onset after exercise but studies have shown that regular exercise may actually prevent headaches from coming. If you have exercise-induced headaches, please make sure that you drink plenty of fluid before and after exercising and that you do not over do it and do not overheat.  For migraine prevention, I would like to suggest: Neurontin (gabapentin) 100 mg strength: Take 1 pill nightly at bedtime for 1 week, then 2 pills nightly for 1 week, then 3 pills each night thereafter. The most common side effects reported are sedation or sleepiness. Rare side effects include balance problems, confusion. It is, generally speaking, a very well tolerated medication.   We will do a brain MRI without contrast.   For migraine abortive treatment, try the Maxalt and for nausea, I will prescribe phenergan, starting with 12.5 mg orally as needed, up to every 8 hours. It can make you sleepy. Rare side effects are abnormal movements.

## 2014-02-08 NOTE — Progress Notes (Signed)
Subjective:    Patient ID: Kimberly Guerra is a 28 y.o. female.  HPI    Star Age, MD, PhD [facility] [address] [place], [place] [address]   Dear Dr. Ouida Sills,    I saw your patient, Kimberly Guerra, upon your kind request in my neurologic clinic today for initial consultation of her recurrent headaches, in particular, her history of migraines. The patient is unaccompanied today. As you know, Kimberly Guerra is a very pleasant 28 year old right-handed woman with an underlying medical history of asthma, and sickle cell trait, who has a history of migraines, diagnosed at age 69. She used to [facility] and received shots in her head, trigger point injections, but they were not helpful. In the past, she was on Topamax which helped, and she tried Imitrex report of treatment which did not help. She had some some SEs with topamax though, including "bad thoughts, and crazy dreams", therefore, she is reluctant to take it again. She moved from [place] 2 1/2 years ago, and reports having had a head CT there and it was normal, per her. She has not tried the Maxalt yet, which you prescribed, as she was not able to fill it until yesterday. She has a migraine headache about 2-3 times a week, lasting for over 24 hours each time. She has intermittent dizziness for the past few years along with the headache. She describes a sense of lightheadedness, but no vertigo.  She does not endorse drinking caffeine on a daily basis and drinks EtOH rarely and no illicit drugs. She endorses no significant stress.   She reports a bilateral headache, which is described as intermittent and as a throbbing sensation, usually in the back and the vertex. There is associated nausea and vomiting, and there is photophobia and sonophobia. Her headache (HA) frequency is 12-15 per month. She estimates that there are 15 HA days/month. There is family history of  migraine in her biological father, her PA and her mother.   Treatments tried include OTC advil, excedrin, aleve, tylenol.  The patient denies prior TIA or stroke symptoms, such as sudden onset of one sided weakness, numbness, tingling, slurring of speech or droopy face, hearing loss, tinnitus, diplopia or visual field cut or monocular loss of vision, and denies recurrent headaches.  Of note, the patient reports snoring, and there is no report of witnessed apneas or choking sensations while asleep.  She does not sleep well. She goes to bed at 9 PM and has trouble maintaining sleep. She has to get up at 5 AM and feels poorly rested. She often wakes up with a headache. She has a visual aura often.   Her Past Medical History Is Significant For: Past Medical History  Diagnosis Date  . Asthma   . Migraine   . Sickle cell trait   . Migraines 02/08/2014    Her Past Surgical History Is Significant For: Past Surgical History  Procedure Laterality Date  . Cesarean section  2010    Her Family History Is Significant For: Family History  Problem Relation Age of Onset  . Hypertension Mother   . Asthma Father   . Hypertension Father   . Asthma Brother   . Asthma Paternal Aunt   . Diabetes Paternal Aunt   . Cancer Maternal Grandfather     oral cancer  . Diabetes Maternal Grandfather   . Hypertension Maternal Grandfather     Her Social History Is Significant For:  History   Social History  . Marital Status: Single    Spouse Name: N/A    Number of Children: 1  . Years of Education: 12   Occupational History  . sales    Social History Main Topics  . Smoking status: Never Smoker   . Smokeless tobacco: Never Used  . Alcohol Use: Yes     Comment: occasional  . Drug Use: No  . Sexual Activity: None   Other Topics Concern  . None   Social History Narrative   Lives with son (2010), employeed at [employer] part-time. Completed some college classes.     Her Allergies Are:  No Known  Allergies:   Her Current Medications Are:  Outpatient Encounter Prescriptions as of 02/08/2014  Medication Sig  . albuterol (PROVENTIL HFA;VENTOLIN HFA) 108 (90 BASE) MCG/ACT inhaler Inhale 2 puffs into the lungs every 6 (six) hours as needed. For wheeze or shortness of breath   . rizatriptan (MAXALT) 10 MG tablet Take one tablet at onset of headache. May repeat in 2 hours if needed.  . [DISCONTINUED] eletriptan (RELPAX) 40 MG tablet One tablet by mouth at onset of headache. May repeat in 2 hours if headache persists or recurs.  :  Review of Systems:  Out of a complete 14 point review of systems, all are reviewed and negative with the exception of these symptoms as listed below:    Review of Systems  Constitutional: Positive for fatigue.  Eyes:       Blurry vision and eye pain during Migraines   Respiratory:       Snoring   Cardiovascular: Negative.   Gastrointestinal: Negative.   Endocrine: Negative.   Genitourinary: Negative.   Musculoskeletal: Negative.   Skin: Negative.   Allergic/Immunologic: Negative.   Neurological: Positive for dizziness, syncope and headaches.  Hematological: Negative.   Psychiatric/Behavioral: Negative.     Objective:  Neurologic Exam  Physical Exam Physical Examination:   Filed Vitals:   02/08/14 1308  BP: 124/79  Pulse: 79    General Examination: The patient is a very pleasant 28 y.o. female in no acute distress. She appears well-developed and well-nourished and well groomed.   HEENT: Normocephalic, atraumatic, pupils are equal, round and reactive to light and accommodation. Funduscopic exam is normal with sharp disc margins noted. Extraocular tracking is good without limitation to gaze excursion or nystagmus noted. Normal smooth pursuit is noted. Hearing is grossly intact. Tympanic membranes are clear bilaterally. Face is symmetric with normal facial animation and normal facial sensation. Speech is clear with no dysarthria noted. There is no  hypophonia. There is no lip, neck/head, jaw or voice tremor. Neck is supple with full range of passive and active motion. There are no carotid bruits on auscultation. Oropharynx exam reveals: mild mouth dryness, adequate dental hygiene and mild airway crowding, due to larger tongue. Mallampati is class I. Tongue protrudes centrally and palate elevates symmetrically. Tonsils are small.    Chest: Clear to auscultation without wheezing, rhonchi or crackles noted.  Heart: S1+S2+0, regular and normal without murmurs, rubs or gallops noted.   Abdomen: Soft, non-tender and non-distended with normal bowel sounds appreciated on auscultation.  Extremities: There is no pitting edema in the distal lower extremities bilaterally. Pedal pulses are intact.  Skin: Warm and dry without trophic changes noted. There are no varicose veins.  Musculoskeletal: exam reveals no obvious joint deformities, tenderness or joint swelling or erythema.   Neurologically:  Mental status: The patient is awake, alert and oriented  in all 4 spheres. Her immediate and remote memory, attention, language skills and fund of knowledge are appropriate. There is no evidence of aphasia, agnosia, apraxia or anomia. Speech is clear with normal prosody and enunciation. Thought process is linear. Mood is normal and affect is normal.  Cranial nerves II - XII are as described above under HEENT exam. In addition: shoulder shrug is normal with equal shoulder height noted. Motor exam: Normal bulk, strength and tone is noted. There is no drift, tremor or rebound. Romberg is negative. Reflexes are 2+ throughout. Babinski: Toes are flexor bilaterally. Fine motor skills and coordination: intact with normal finger taps, normal hand movements, normal rapid alternating patting, normal foot taps and normal foot agility.  Cerebellar testing: No dysmetria or intention tremor on finger to nose testing. Heel to shin is unremarkable bilaterally. There is no truncal  or gait ataxia.  Sensory exam: intact to light touch, pinprick, vibration, temperature sense and proprioception in the upper and lower extremities.  Gait, station and balance: She stands easily. No veering to one side is noted. No leaning to one side is noted. Posture is age-appropriate and stance is narrow based. Gait shows normal stride length and normal pace. No problems turning are noted. She turns en bloc. Tandem walk is unremarkable. Intact toe and heel stance is noted.               Assessment and Plan:   In summary, Kimberly Guerra is a very pleasant 28 y.o.-year old female with an underlying medical history of asthma, and sickle cell trait, who has a longstanding history of migraines with aura since age 50. She has associated N/V, photo- and sonophobia and dizziness. She has a non-focal exam and I reassured the patient in that regard. She does not sleep well.  I had a long chat with the patient about my findings and the diagnosis of migraines with aura, the prognosis and treatment options. We talked about medical treatments and non-pharmacological approaches. We talked about healthy lifestyle in general. I encouraged the patient to eat healthy, exercise daily and keep well hydrated, to keep a scheduled bedtime and wake time routine, to not skip any meals and eat healthy snacks in between meals and to have protein with every meal.   I advised the patient about common headache triggers: sleep deprivation, dehydration, overheating, stress, hypoglycemia or skipping meals and blood sugar fluctuations, excessive pain medications or excessive alcohol use or caffeine withdrawal. Some people have food triggers such as aged cheese, orange juice or chocolate, especially dark chocolate, or MSG (monosodium glutamate). She is to try to avoid these headache triggers as much possible. It may be helpful to keep a headache diary to figure out what makes Her headaches worse or brings them on and what alleviates  them. Some people report headache onset after exercise but studies have shown that regular exercise may actually prevent headaches from coming. If She has exercise-induced headaches, She is advised to drink plenty of fluid before and after exercising and that to not overdo it and to not overheat.  As far as further diagnostic testing is concerned, I suggested the following today: MRI brain w/o Gad.    As far as medications are concerned, I recommended the following at this time: try Maxalt as abortive treatment, and we can start low dose gabapentin 100 mg qHS for preventative treatment with gradual buildup to 300 mg at night. I told her about potential side effects including sedation, confusion and exacerbation of her  dizziness. I would probably avoid a beta blocker in her case as she has a history of asthma even though it is typically very well-controlled. I will also prescribe phenergan for prn use for nausea. Down the road, the gabapentin has not worked out we can try Elavil or verapamil or sonogram. She's not pregnant and is not planning on getting pregnant any time soon. We will avoid Topamax in the future because she did have side effects from it. I told her about the sedation properties of Phenergan as well. She is advised not to take it and drive and also Maxalt can cause sedation and she is advised not to take it and drive right away. She is to try these medicines when she is not going to immediately drive, until she knows how these medicines affect her. She demonstrated understanding. I answered all her questions today and the patient was in agreement with the above outlined plan. I would like to see the patient back in 3 months, sooner if the need arises and encouraged her to call with any interim questions, concerns, problems or updates and refill requests and test results.   Thank you very much for allowing me to participate in the care of this nice patient. If I can be of any further assistance  to you please do not hesitate to call me at [phone].  Sincerely,   Star Age, MD, PhD

## 2014-02-15 ENCOUNTER — Ambulatory Visit (INDEPENDENT_AMBULATORY_CARE_PROVIDER_SITE_OTHER): Payer: BC Managed Care – PPO

## 2014-02-15 DIAGNOSIS — R9089 Other abnormal findings on diagnostic imaging of central nervous system: Secondary | ICD-10-CM

## 2014-02-15 DIAGNOSIS — G43119 Migraine with aura, intractable, without status migrainosus: Secondary | ICD-10-CM

## 2014-02-16 NOTE — Progress Notes (Signed)
Quick Note:  Please advise patient had her brain MRI showed a very small cystic lesion on the left side of the back of her brain. While this most likely represents a benign structure and fluid-filled lesion, containing spinal fluid, I would like to follow this up with a repeat brain MRI with contrast to better delineate the lesion. There is no cause for concern but I would like for her to schedule another brain MRI, this time with contrast, which I will order.  Star Age, MD, PhD [facility]  ______

## 2014-02-21 NOTE — Progress Notes (Signed)
Quick Note:  I called pt and relayed the results of her MRI brain. Showed incidental CSF cyst, would like to do MRI brain w/contrast to better delineate lesion. Pt verbalized understanding. Will await call to schedule. ______

## 2014-03-29 ENCOUNTER — Other Ambulatory Visit: Payer: BC Managed Care – PPO

## 2014-04-05 ENCOUNTER — Ambulatory Visit (INDEPENDENT_AMBULATORY_CARE_PROVIDER_SITE_OTHER): Payer: BC Managed Care – PPO

## 2014-04-05 DIAGNOSIS — R930 Abnormal findings on diagnostic imaging of skull and head, not elsewhere classified: Secondary | ICD-10-CM

## 2014-04-05 DIAGNOSIS — G43119 Migraine with aura, intractable, without status migrainosus: Secondary | ICD-10-CM

## 2014-04-05 DIAGNOSIS — R9089 Other abnormal findings on diagnostic imaging of central nervous system: Secondary | ICD-10-CM

## 2014-04-05 MED ORDER — GADOPENTETATE DIMEGLUMINE 469.01 MG/ML IV SOLN: 14.0000 mL | Freq: Once | INTRAVENOUS | Status: AC | PRN

## 2014-04-10 NOTE — Progress Notes (Signed)
Quick Note:  Please advise patient that her repeat brain MRI from last week with contrast administration showed no abnormal enhancement. This is a very reassuring finding and no further action is required. Star Age, MD, PhD [facility]  ______

## 2014-04-11 NOTE — Progress Notes (Signed)
Quick Note:  Shared MR Brain results with patient per Dr Guadelupe Sabin findings, she verbalized understanding ______

## 2014-06-11 ENCOUNTER — Ambulatory Visit (INDEPENDENT_AMBULATORY_CARE_PROVIDER_SITE_OTHER): Payer: BC Managed Care – PPO | Admitting: Family Medicine

## 2014-06-11 DIAGNOSIS — J45901 Unspecified asthma with (acute) exacerbation: Secondary | ICD-10-CM

## 2014-06-11 DIAGNOSIS — G43909 Migraine, unspecified, not intractable, without status migrainosus: Secondary | ICD-10-CM

## 2014-06-11 DIAGNOSIS — R0602 Shortness of breath: Secondary | ICD-10-CM

## 2014-06-11 DIAGNOSIS — R062 Wheezing: Secondary | ICD-10-CM

## 2014-06-11 DIAGNOSIS — R110 Nausea: Secondary | ICD-10-CM

## 2014-06-11 MED ORDER — IPRATROPIUM BROMIDE 0.02 % IN SOLN
0.5000 mg | Freq: Once | RESPIRATORY_TRACT | Status: AC
  Administered 2014-06-11: 0.5 mg via RESPIRATORY_TRACT

## 2014-06-11 MED ORDER — PROMETHAZINE HCL 25 MG/ML IJ SOLN: 25.0000 mg | Freq: Four times a day (QID) | INTRAMUSCULAR | Status: DC | PRN

## 2014-06-11 MED ORDER — KETOROLAC TROMETHAMINE 60 MG/2ML IM SOLN
60.0000 mg | Freq: Once | INTRAMUSCULAR | Status: AC
  Administered 2014-06-11: 60 mg via INTRAMUSCULAR

## 2014-06-11 MED ORDER — ALBUTEROL SULFATE (2.5 MG/3ML) 0.083% IN NEBU
5.0000 mg | INHALATION_SOLUTION | Freq: Once | RESPIRATORY_TRACT | Status: AC
  Administered 2014-06-11: 5 mg via RESPIRATORY_TRACT

## 2014-06-12 MED ORDER — ALBUTEROL SULFATE HFA 108 (90 BASE) MCG/ACT IN AERS: 2.0000 | INHALATION_SPRAY | Freq: Four times a day (QID) | RESPIRATORY_TRACT | Status: DC | PRN

## 2014-06-12 MED ORDER — FLUTICASONE-SALMETEROL 250-50 MCG/DOSE IN AEPB: 1.0000 | INHALATION_SPRAY | Freq: Two times a day (BID) | RESPIRATORY_TRACT | Status: DC

## 2014-06-12 MED ORDER — BUTALBITAL-APAP-CAFFEINE 50-325-40 MG PO TABS: 1.0000 | ORAL_TABLET | Freq: Four times a day (QID) | ORAL | Status: DC | PRN

## 2014-06-12 MED ORDER — PROMETHAZINE HCL 25 MG PO TABS: 25.0000 mg | ORAL_TABLET | Freq: Three times a day (TID) | ORAL | Status: DC | PRN

## 2014-06-12 MED ORDER — PROMETHAZINE HCL 25 MG/ML IJ SOLN: 25.0000 mg | Freq: Once | INTRAMUSCULAR | Status: DC

## 2014-06-12 NOTE — Progress Notes (Signed)
Chief Complaint:  Chief Complaint  Patient presents with  . Shortness of Breath  . Migraine    HPI: Kimberly Guerra is a 28 y.o. female who is here for typical migraine HA which has been intermittent this past week which she took Maxalt for with relief int he past, she took one this morning and felt SOB and started having asthma sxs. She thinks it was a combination of her asthma not being well controlled and then the HA made it worse. She tried to find her inhaleres but could not find them, she is here for SOB, diffuse migraine HA, nausea, no emesis, no vision changes, no CP.   Past Medical History  Diagnosis Date  . Asthma   . Migraine   . Sickle cell trait   . Migraines 02/08/2014   Past Surgical History  Procedure Laterality Date  . Cesarean section  2010   History   Social History  . Marital Status: Single    Spouse Name: N/A    Number of Children: 1  . Years of Education: 12   Occupational History  . sales    Social History Main Topics  . Smoking status: Never Smoker   . Smokeless tobacco: Never Used  . Alcohol Use: Yes     Comment: occasional  . Drug Use: No  . Sexual Activity: Not on file   Other Topics Concern  . Not on file   Social History Narrative   Lives with son (2010), employeed at [employer] part-time. Completed some college classes.    Family History  Problem Relation Age of Onset  . Hypertension Mother   . Asthma Father   . Hypertension Father   . Asthma Brother   . Asthma Paternal Aunt   . Diabetes Paternal Aunt   . Cancer Maternal Grandfather     oral cancer  . Diabetes Maternal Grandfather   . Hypertension Maternal Grandfather    No Known Allergies Prior to Admission medications   Medication Sig Start Date End Date Taking? Authorizing Macgregor Aeschliman  albuterol (PROVENTIL HFA;VENTOLIN HFA) 108 (90 BASE) MCG/ACT inhaler Inhale 2 puffs into the lungs every 6 (six) hours as needed. For wheeze or shortness of breath     Historical  Darlisa Spruiell, MD  gabapentin (NEURONTIN) 100 MG capsule Take 1 pill nightly at bedtime for 1 week, then 2 pills nightly for 1 week, then 3 pills each night thereafter. 02/08/14   Star Age, MD  promethazine (PHENERGAN) 12.5 MG tablet Take 1 tablet (12.5 mg total) by mouth every 8 (eight) hours as needed for nausea or vomiting. 02/08/14   Star Age, MD  rizatriptan (MAXALT) 10 MG tablet Take one tablet at onset of headache. May repeat in 2 hours if needed. 02/02/14   Ellison Carwin, MD     ROS: The patient denies fevers, chills, night sweats, unintentional weight loss, chest pain, palpitations, vomiting, abdominal pain, dysuria, hematuria, melena, numbness, weakness, or tingling.   All other systems have been reviewed and were otherwise negative with the exception of those mentioned in the HPI and as above.    PHYSICAL EXAM: There were no vitals filed for this visit. There were no vitals filed for this visit. There is no weight on file to calculate BMI.   VSS on review, epic was down, this was charted on paper. Pulse ox was 100%, BP < 140/90, HR 84-95  General: Alert, mild acute distress, anxious, tearful  HEENT:  Normocephalic, atraumatic, oropharynx patent. EOMI, PERRLA  Cardiovascular:  Regular rate and rhythm, no rubs murmurs or gallops.  No Carotid bruits, radial pulse intact. No pedal edema.  Respiratory: Clear to auscultation bilaterally.  + wheezes, No rales, or rhonchi.  No cyanosis, no use of accessory musculature GI: No organomegaly, abdomen is soft and non-tender, positive bowel sounds.  No masses. Skin: No rashes. Neurologic: Facial musculature symmetric. Psychiatric: Patient is appropriate throughout our interaction. Lymphatic: No cervical lymphadenopathy Musculoskeletal: Gait intact.   LABS: Results for orders placed in visit on 50/09/38  BASIC METABOLIC PANEL      Result Value Ref Range   Sodium 137  135 - 145 mEq/L   Potassium 4.8  3.5 - 5.3 mEq/L   Chloride 101  96  - 112 mEq/L   CO2 28  19 - 32 mEq/L   Glucose, Bld 66 (*) 70 - 99 mg/dL   BUN 8  6 - 23 mg/dL   Creat 0.69  0.50 - 1.10 mg/dL   Calcium 9.8  8.4 - 10.5 mg/dL  POCT INFLUENZA A/B      Result Value Ref Range   Influenza A, POC Negative     Influenza B, POC Negative    POCT RAPID STREP A (OFFICE)      Result Value Ref Range   Rapid Strep A Screen Negative  Negative  POCT CBC      Result Value Ref Range   WBC 6.6  4.6 - 10.2 K/uL   Lymph, poc 1.9  0.6 - 3.4   POC LYMPH PERCENT 29.5  10 - 50 %L   MID (cbc) 0.3  0 - 0.9   POC MID % 5.0  0 - 12 %M   POC Granulocyte 4.3  2 - 6.9   Granulocyte percent 65.5  37 - 80 %G   RBC 4.93  4.04 - 5.48 M/uL   Hemoglobin 12.6  12.2 - 16.2 g/dL   HCT, POC 41.0  37.7 - 47.9 %   MCV 83.2  80 - 97 fL   MCH, POC 25.6 (*) 27 - 31.2 pg   MCHC 30.7 (*) 31.8 - 35.4 g/dL   RDW, POC 16.3     Platelet Count, POC 391  142 - 424 K/uL   MPV 8.4  0 - 99.8 fL     EKG/XRAY:   Primary read interpreted by Dr. Marin Comment at [facility].   ASSESSMENT/PLAN: Encounter Diagnoses  Name Primary?  Marland Kitchen Asthma with acute exacerbation Yes  . SOB (shortness of breath)   . Wheezing   . Migraine headache   . Nausea alone    Kimberly Guerra is a pleasant 28 year old who unfortunately is here with poorly controlled asthma sxs and a migraine HA. Neuro exam was normal. VSS She has been noncompliant with her asthma meds, has not been able to find them. When she needed her albuterol it was 2-3 x per week. She had an acute trigger today, most likely temp changes ( she states she can have asthma sxs from anything season, pollen, temp changes, smoke, etc)   She was given 2 neb treatments, first one was albuterol,  Felt better but still had wheezing, so she was subsequently then given an albuterol and atrovent neb treatment due to continuous wheezing. She felt better and wheezing was not audible after this. Rx for asthma: Advair diskus 250 mg 2 puffs BID # 11 RF , albuterol inhaler # 5 RF, she was not  given any oral prednisone since she was feeling better and had  no audible wheezing after neb treatments  For her HA and nausea; She was given Toradol 60 mg IM x 1 and also Promethazine 25 mg IM x 1 for HA, a rx for Fioricet, a rx for promethazine  and also advise to stop maxalt. She will see her neuorlogist at the end of the month for HA follow-up. I advise this since she states the Maxalt works but sometimes makes her feel SOB.    Gross sideeffects, risk and benefits, and alternatives of medications d/w patient. Patient is aware that all medications have potential sideeffects and we are unable to predict every sideeffect or drug-drug interaction that may occur.  LE, Edmondson, DO 06/12/2014 7:02 AM

## 2014-06-26 ENCOUNTER — Encounter: Payer: Self-pay | Admitting: Neurology

## 2014-06-26 ENCOUNTER — Ambulatory Visit (INDEPENDENT_AMBULATORY_CARE_PROVIDER_SITE_OTHER): Payer: BC Managed Care – PPO | Admitting: Neurology

## 2014-06-26 VITALS — BP 126/81 | HR 90 | Temp 99.0°F | Ht 58.5 in | Wt 148.0 lb

## 2014-06-26 DIAGNOSIS — G43119 Migraine with aura, intractable, without status migrainosus: Secondary | ICD-10-CM

## 2014-06-26 MED ORDER — KETOPROFEN 50 MG PO CAPS: 50.0000 mg | ORAL_CAPSULE | Freq: Three times a day (TID) | ORAL | Status: DC | PRN

## 2014-06-26 MED ORDER — GABAPENTIN (ONCE-DAILY) 300 MG PO TABS: 300.0000 mg | ORAL_TABLET | Freq: Every day | ORAL | Status: DC

## 2014-06-26 NOTE — Progress Notes (Signed)
Subjective:    Patient ID: Caitlan Chauca is a 28 y.o. female.  HPI    Interim history:   Ms. Ammon is a very pleasant 28 year old right-handed woman with an underlying medical history of asthma, and sickle cell trait, who presents for followup consultation of her migraine with aura. She is unaccompanied today. I first met her on 02/08/2014, at which time she gave a long-standing history of migraine headaches since age 28. At the time of her first appointment I suggested Maxalt as needed for abortive treatment and preventative treatment with gabapentin with gradual buildup starting at 100 mg strength. I also ordered a brain MRI without contrast. She had this on 02/16/2014:Abnormal MRI brain showing a small 8 x5 mm cystic lesion to the the left of the cerebellar vermis with signal that is slightly atypical for a benign CSF cyst. No other structural lesion, tumor or infarction noted. Recommend MRI scan with contrast and follow up scan. We called her with the test results. She had a contrasted brain MRI as a repeat study to followup on 04/06/2014 which was reported as normal. She was called with the test results.  Today, she reports, that she has been able to only take gabapentin 200 mg each night, d/t diarrhea and vomiting on 300 mg. She overall felt slightly improved has taken the Maxalt, but had chest tightness. She has taken it about 5 or 6 times and the last time she took it 2 weeks ago, she had not only chest tightness, but a full blown asthma attack, she had to go to [facility], where she had nebulizer treaments and a toradol shot IM. Fioricet does not help. She had been tolerating Maxalt before. This is the first time it caused her to have significant chest symptoms and problems with her asthma. Thankfully she has not had a headache in the past 2 weeks. Potential triggers could be stress, heat, dehydration.   She has a history of migraines, diagnosed at age 28. She [facility] and received shots in her head, trigger point injections, but they were not helpful. In the past, she was on Topamax which helped, and she tried Imitrex report of treatment which did not help. She had some some SEs with topamax though, including "bad thoughts, and crazy dreams", therefore, she is reluctant to take it again. She moved from [place] 2 1/2 years ago, and reports having had a head CT there and it was normal, per her verbal report. She has intermittent dizziness for the past few years along with the headache. She describes a sense of lightheadedness, but no vertigo. She does not endorse drinking caffeine on a daily basis and drinks EtOH rarely and no illicit drugs. She endorses no significant stress. She reports a bilateral headache, which is described as intermittent and as a throbbing sensation, usually in the back and the vertex. There is associated nausea and vomiting, and there is photophobia and sonophobia. Her headache (HA) frequency is 12-15 per month. She estimates that there are 15 HA days/month. There is family history of migraine in her biological father, her PA and her mother. Treatments tried include OTC advil, excedrin, aleve, tylenol. The patient denies prior TIA or stroke symptoms, such as sudden onset of one sided weakness, numbness, tingling, slurring of speech or droopy face, hearing loss, tinnitus, diplopia or visual field cut or monocular loss of vision, and denies recurrent headaches. Of note, the patient reports snoring, and there is no report of  witnessed apneas or choking sensations while asleep. She does not sleep well. She goes to bed at 9 PM and has trouble maintaining sleep. She has to get up at 5 AM and feels poorly rested. She often wakes up with a headache. She has a visual aura often.    Her Past Medical History Is Significant For: Past Medical History  Diagnosis Date  . Asthma   . Migraine   . Sickle cell trait   . Migraines 02/08/2014    Her Past  Surgical History Is Significant For: Past Surgical History  Procedure Laterality Date  . Cesarean section  2010    Her Family History Is Significant For: Family History  Problem Relation Age of Onset  . Hypertension Mother   . Asthma Father   . Hypertension Father   . Asthma Brother   . Asthma Paternal Aunt   . Diabetes Paternal Aunt   . Cancer Maternal Grandfather     oral cancer  . Diabetes Maternal Grandfather   . Hypertension Maternal Grandfather     Her Social History Is Significant For: History   Social History  . Marital Status: Single    Spouse Name: N/A    Number of Children: 1  . Years of Education: 12   Occupational History  . sales    Social History Main Topics  . Smoking status: Never Smoker   . Smokeless tobacco: Never Used  . Alcohol Use: Yes     Comment: occasional  . Drug Use: No  . Sexual Activity: None   Other Topics Concern  . None   Social History Narrative   Lives with son (27), employeed at [employer] part-time. Completed some college classes.     Her Allergies Are:  No Known Allergies:   Her Current Medications Are:  Outpatient Encounter Prescriptions as of 06/26/2014  Medication Sig  . albuterol (PROVENTIL HFA;VENTOLIN HFA) 108 (90 BASE) MCG/ACT inhaler Inhale 2 puffs into the lungs every 6 (six) hours as needed. For wheeze or shortness of breath  . butalbital-acetaminophen-caffeine (FIORICET) 50-325-40 MG per tablet Take 1-2 tablets by mouth every 6 (six) hours as needed for headache.  . Fluticasone-Salmeterol (ADVAIR DISKUS) 250-50 MCG/DOSE AEPB Inhale 1 puff into the lungs 2 (two) times daily.  Marland Kitchen gabapentin (NEURONTIN) 100 MG capsule Take 1 pill nightly at bedtime for 1 week, then 2 pills nightly for 1 week, then 3 pills each night thereafter.  . promethazine (PHENERGAN) 12.5 MG tablet Take 1 tablet (12.5 mg total) by mouth every 8 (eight) hours as needed for nausea or vomiting.  . promethazine (PHENERGAN) 25 MG tablet Take 1 tablet  (25 mg total) by mouth every 8 (eight) hours as needed for nausea or vomiting.  :  Review of Systems:  Out of a complete 14 point review of systems, all are reviewed and negative with the exception of these symptoms as listed below:  Review of Systems  Constitutional: Positive for appetite change and fatigue.  Eyes: Negative.   Respiratory: Negative.   Cardiovascular: Negative.   Gastrointestinal: Negative.   Endocrine: Negative.   Genitourinary: Negative.   Musculoskeletal: Negative.   Skin: Negative.   Allergic/Immunologic: Negative.   Neurological: Positive for dizziness, tremors and headaches.  Hematological: Negative.   Psychiatric/Behavioral: Positive for sleep disturbance (eds, frequent waking, snoring) and agitation.    Objective:  Neurologic Exam  Physical Exam Physical Examination:   Filed Vitals:   06/26/14 1157  BP: 126/81  Pulse: 90  Temp: 99 F (37.2  C)    General Examination: The patient is a very pleasant 28 y.o. female in no acute distress. She appears well-developed and well-nourished and well groomed.   HEENT: Normocephalic, atraumatic, pupils are equal, round and reactive to light and accommodation. Funduscopic exam is normal with sharp disc margins noted. Extraocular tracking is good without limitation to gaze excursion or nystagmus noted. Normal smooth pursuit is noted. Hearing is grossly intact. Face is symmetric with normal facial animation and normal facial sensation. Speech is clear with no dysarthria noted. There is no hypophonia. There is no lip, neck/head, jaw or voice tremor. Neck is supple with full range of passive and active motion. There are no carotid bruits on auscultation. Oropharynx exam reveals: mild mouth dryness, adequate dental hygiene and mild airway crowding, due to larger tongue. Mallampati is class I. Tongue protrudes centrally and palate elevates symmetrically. Tonsils are small.    Chest: Clear to auscultation without wheezing,  rhonchi or crackles noted.  Heart: S1+S2+0, regular and normal without murmurs, rubs or gallops noted.   Abdomen: Soft, non-tender and non-distended with normal bowel sounds appreciated on auscultation.  Extremities: There is no pitting edema in the distal lower extremities bilaterally. Pedal pulses are intact.  Skin: Warm and dry without trophic changes noted. There are no varicose veins.  Musculoskeletal: exam reveals no obvious joint deformities, tenderness or joint swelling or erythema.   Neurologically:  Mental status: The patient is awake, alert and oriented in all 4 spheres. Her immediate and remote memory, attention, language skills and fund of knowledge are appropriate. There is no evidence of aphasia, agnosia, apraxia or anomia. Speech is clear with normal prosody and enunciation. Thought process is linear. Mood is normal and affect is normal.  Cranial nerves II - XII are as described above under HEENT exam. In addition: shoulder shrug is normal with equal shoulder height noted. Motor exam: Normal bulk, strength and tone is noted. There is no drift, tremor or rebound. Romberg is negative. Reflexes are 2+ throughout. Fine motor skills and coordination: intact with normal finger taps, normal hand movements, normal rapid alternating patting, normal foot taps and normal foot agility.  Cerebellar testing: No dysmetria or intention tremor on finger to nose testing. Heel to shin is unremarkable bilaterally. There is no truncal or gait ataxia.  Sensory exam: intact to light touch, pinprick, vibration, temperature sense in the upper and lower extremities.  Gait, station and balance: She stands easily. No veering to one side is noted. No leaning to one side is noted. Posture is age-appropriate and stance is narrow based. Gait shows normal stride length and normal pace. No problems turning are noted. She turns en bloc. Tandem walk is unremarkable. Intact toe and heel stance is noted.                Assessment and Plan:   In summary, Chattie Greeson is a very pleasant 29 year old female with an underlying medical history of asthma, and sickle cell trait, who presents for followup consultation of her migraines with aura. She has had migraines since age 33. I suggested Maxalt as needed last time and a trial of gabapentin for preventative. While she has done a little bit better with these 2 medications she did have some side effects from Maxalt the last time she used it including a asthma attack for which she had to go to urgent care. Also, she cannot really tolerate gabapentin more than 200 mg each night as she has consistently experienced nausea and  diarrhea with it. Her physical exam continues to be nonfocal. Potential triggers currently could include sleep deprivation, stress, overheating, dehydration.  I again advised the patient about common headache triggers: sleep deprivation, dehydration, overheating, stress, hypoglycemia or skipping meals and blood sugar fluctuations, excessive pain medications or excessive alcohol use or caffeine withdrawal. Some people have food triggers such as aged cheese, orange juice or chocolate, especially dark chocolate, or MSG (monosodium glutamate). She is to try to avoid these headache triggers as much possible. It may be helpful to keep a headache diary to figure out what makes Her headaches worse or brings them on and what alleviates them. Some people report headache onset after exercise but studies have shown that regular exercise may actually prevent headaches from coming. If She has exercise-induced headaches, She is advised to drink plenty of fluid before and after exercising and that to not overdo it and to not overheat. As far as medications are concerned, I recommended the following at this time: try Ketoprofen, in lieu of Maxalt as abortive treatment, so long as it is okay with her asthma. She is advised to her primary care provider about trying ketoprofen  as needed. She is advised to use it cautiously and sparingly. We should avoid Maxalt and crepitans at this time. I would like to switch her from gabapentin to once daily Gralise 300 mg qHS. I provided her with 2 new prescriptions including a co-pay card for Gralise, but she can also go online for a savings voucher. Down the road, we can try Elavil or verapamil or Zonegran. We will avoid Topamax as she had side effects from it in the past.  I answered all her questions today and the patient was in agreement with the above outlined plan. I would like to see the patient back in 4 months routinely, but I would also like to see her in about 2 months by our NP, Charlott Holler. The patient voiced agreement.

## 2014-06-26 NOTE — Patient Instructions (Addendum)
Talk to your primary care provider regarding using ketoprofen for her acute headache management. It is a stronger anti-inflammatory medication. As long as it is okay for you to take with your asthma history you can try this. Use it sparingly as it can be stronger on your stomach lining. In addition, I would like to switch you from gabapentin to Gralise 300 mg once daily at night. You can apparently download a savings card from [employer]  Please remember, common headache triggers are: sleep deprivation, dehydration, overheating, stress, hypoglycemia or skipping meals and blood sugar fluctuations, excessive pain medications or excessive alcohol use or caffeine withdrawal. Some people have food triggers such as aged cheese, orange juice or chocolate, especially dark chocolate, or MSG (monosodium glutamate). Try to avoid these headache triggers as much possible. It may be helpful to keep a headache diary to figure out what makes your headaches worse or brings them on and what alleviates them. Some people report headache onset after exercise but studies have shown that regular exercise may actually prevent headaches from coming. If you have exercise-induced headaches, please make sure that you drink plenty of fluid before and after exercising and that you do not over do it and do not overheat.  Followup with Jeani Hawking, nurse practitioner in 2 months and followup with me routinely in 4 months.

## 2014-08-07 ENCOUNTER — Telehealth: Payer: Self-pay | Admitting: Neurology

## 2014-08-07 DIAGNOSIS — G43119 Migraine with aura, intractable, without status migrainosus: Secondary | ICD-10-CM

## 2014-08-07 MED ORDER — NAPROXEN 375 MG PO TABS: ORAL_TABLET | ORAL | Status: DC

## 2014-08-07 MED ORDER — AMITRIPTYLINE HCL 25 MG PO TABS: ORAL_TABLET | ORAL | Status: DC

## 2014-08-07 NOTE — Telephone Encounter (Signed)
I called the patient back.  Relayed Dr Guadelupe Sabin message.  She verbalized understanding and will call us back if anything further is needed.

## 2014-08-07 NOTE — Telephone Encounter (Signed)
We will change ketoprofen to naproxen. Please have her check with her allergy and asthma doctor if she can take naproxen as needed. Provided a prescription. We can try low dose amitriptyline for headache prevention in lieu of in the gabapentin. She could not tolerate more than 200 mg of gabapentin. We will start with low dose amitriptyline: Elavil (generic name: amitriptyline) 25 mg: Take half a pill daily at bedtime for 2 weeks, then one pill daily at bedtime thereafter. Common side effects reported are: mouth dryness, drowsiness, confusion, dizziness. Please  notify patient.

## 2014-08-07 NOTE — Telephone Encounter (Signed)
Patient calling to state that she called her pharmacy regarding her 2 new scripts that Dr. Rexene Alberts prescribed and they say they are still waiting on prior authorization, if questions please call.

## 2014-08-07 NOTE — Telephone Encounter (Signed)
I contacted the pharmacy.  They state Ketoprofen is currently not available and a substitute product will need to be prescribed.  Next, they said the patient's insurance has changed and current plan will not cover Gralise.  I called ins.  Michela Pitcher they are unable to provide coverage on Gralise as this medication is excluded from her formulary.  They will only cover regular Gabapentin.  Would you like to change both Rx's?  Please advise.  Thank you.

## 2014-08-20 ENCOUNTER — Ambulatory Visit: Payer: Self-pay | Admitting: Nurse Practitioner

## 2014-08-27 ENCOUNTER — Ambulatory Visit: Payer: BC Managed Care – PPO | Admitting: Nurse Practitioner

## 2014-08-28 LAB — HM PAP SMEAR: HM Pap smear: NORMAL

## 2014-09-13 ENCOUNTER — Ambulatory Visit (INDEPENDENT_AMBULATORY_CARE_PROVIDER_SITE_OTHER): Payer: BC Managed Care – PPO | Admitting: Emergency Medicine

## 2014-09-13 VITALS — BP 122/76 | HR 93 | Temp 98.6°F | Resp 17 | Ht <= 58 in | Wt 147.0 lb

## 2014-09-13 DIAGNOSIS — M26629 Arthralgia of temporomandibular joint, unspecified side: Secondary | ICD-10-CM

## 2014-09-13 MED ORDER — DIAZEPAM 2 MG PO TABS: 2.0000 mg | ORAL_TABLET | Freq: Four times a day (QID) | ORAL | Status: DC

## 2014-09-13 MED ORDER — HYDROCODONE-ACETAMINOPHEN 5-325 MG PO TABS: 1.0000 | ORAL_TABLET | ORAL | Status: DC | PRN

## 2014-09-13 MED ORDER — NAPROXEN SODIUM 550 MG PO TABS: 550.0000 mg | ORAL_TABLET | Freq: Two times a day (BID) | ORAL | Status: DC

## 2014-09-13 NOTE — Patient Instructions (Signed)
Temporomandibular Problems  Temporomandibular joint (TMJ) dysfunction means there are problems with the joint between your jaw and your skull. This is a joint lined by cartilage like other joints in your body but also has a small disc in the joint which keeps the bones from rubbing on each other. These joints are like other joints and can get inflamed (sore) from arthritis and other problems. When this joint gets sore, it can cause headaches and pain in the jaw and the face. CAUSES  Usually the arthritic types of problems are caused by soreness in the joint. Soreness in the joint can also be caused by overuse. This may come from grinding your teeth. It may also come from mis-alignment in the joint. DIAGNOSIS Diagnosis of this condition can often be made by history and exam. Sometimes your caregiver may need X-rays or an MRI scan to determine the exact cause. It may be necessary to see your dentist to determine if your teeth and jaws are lined up correctly. TREATMENT  Most of the time this problem is not serious; however, sometimes it can persist (become chronic). When this happens medications that will cut down on inflammation (soreness) help. Sometimes a shot of cortisone into the joint will be helpful. If your teeth are not aligned it may help for your dentist to make a splint for your mouth that can help this problem. If no physical problems can be found, the problem may come from tension. If tension is found to be the cause, biofeedback or relaxation techniques may be helpful. HOME CARE INSTRUCTIONS   Later in the day, applications of ice packs may be helpful. Ice can be used in a plastic bag with a towel around it to prevent frostbite to skin. This may be used about every 2 hours for 20 to 30 minutes, as needed while awake, or as directed by your caregiver.  Only take over-the-counter or prescription medicines for pain, discomfort, or fever as directed by your caregiver.  If physical therapy was  prescribed, follow your caregiver's directions.  Wear mouth appliances as directed if they were given. Document Released: 09/08/2001 Document Revised: 03/07/2012 Document Reviewed: 12/16/2008 [employer] Patient Information 2015 [employer]. This information is not intended to replace advice given to you by your health care provider. Make sure you discuss any questions you have with your health care provider.  

## 2014-09-13 NOTE — Progress Notes (Signed)
[facility] [address], [place] [address] [phone]  Date:  09/13/2014   Name:  Kimberly Guerra   DOB:  1986/04/12   MRN:  [id number]  PCP:  No PCP Per Patient    Chief Complaint: Facial Injury and Otalgia   History of Present Illness:  Kimberly Guerra is a 28 y.o. very pleasant female patient who presents with the following:  Says Monday she developed right periorbital pain and pressure that "spread" to her right ear.  Pain has intensified.   Has no headache, nasal congestion, nasal drainage or post nasal drip.   No fever or chills. No facial swelling or dental complaints, heat or cold intolerance, gingival swelling or drainage No cough, wheezing or shortness of breath No sore throat.  No conjunctival injection or drainage.  No sick contacts. No improvement with over the counter medications or other home remedies. Denies other complaint or health concern today.   Patient Active Problem List   Diagnosis Date Noted  . Migraines 02/08/2014    Past Medical History  Diagnosis Date  . Asthma   . Migraine   . Sickle cell trait   . Migraines 02/08/2014    Past Surgical History  Procedure Laterality Date  . Cesarean section  2010    History  Substance Use Topics  . Smoking status: Never Smoker   . Smokeless tobacco: Never Used  . Alcohol Use: Yes     Comment: occasional    Family History  Problem Relation Age of Onset  . Hypertension Mother   . Asthma Father   . Hypertension Father   . Asthma Brother   . Asthma Paternal Aunt   . Diabetes Paternal Aunt   . Cancer Maternal Grandfather     oral cancer  . Diabetes Maternal Grandfather   . Hypertension Maternal Grandfather     No Known Allergies  Medication list has been reviewed and updated.  Current Outpatient Prescriptions on File Prior to Visit  Medication Sig Dispense Refill  . albuterol (PROVENTIL HFA;VENTOLIN HFA) 108 (90 BASE) MCG/ACT inhaler Inhale 2 puffs into the  lungs every 6 (six) hours as needed. For wheeze or shortness of breath  1 Inhaler  5  . amitriptyline (ELAVIL) 25 MG tablet Take half a pill daily at bedtime for 2 weeks, then one pill daily at bedtime thereafter  30 tablet  3  . butalbital-acetaminophen-caffeine (FIORICET) 50-325-40 MG per tablet Take 1-2 tablets by mouth every 6 (six) hours as needed for headache.  20 tablet  0  . Fluticasone-Salmeterol (ADVAIR DISKUS) 250-50 MCG/DOSE AEPB Inhale 1 puff into the lungs 2 (two) times daily.  60 each  11  . promethazine (PHENERGAN) 12.5 MG tablet Take 1 tablet (12.5 mg total) by mouth every 8 (eight) hours as needed for nausea or vomiting.  30 tablet  1  . promethazine (PHENERGAN) 25 MG tablet Take 1 tablet (25 mg total) by mouth every 8 (eight) hours as needed for nausea or vomiting.  20 tablet  0   Current Facility-Administered Medications on File Prior to Visit  Medication Dose Route Frequency Provider Last Rate Last Dose  . promethazine (PHENERGAN) injection 25 mg  25 mg Intramuscular Once Thao P Le, DO        Review of Systems:  As per HPI, otherwise negative.    Physical Examination: Filed Vitals:   09/13/14 1106  BP: 122/76  Pulse: 93  Temp: 98.6 F (37 C)  Resp: 17  Filed Vitals:   09/13/14 1106  Height: 4\' 10"  (1.473 m)  Weight: 147 lb (66.679 kg)   Body mass index is 30.73 kg/(m^2). Ideal Body Weight: Weight in (lb) to have BMI = 25: 119.4   GEN: WDWN, NAD, Non-toxic, Alert & Oriented x 3 HEENT: Atraumatic, Normocephalic.  Oropharynx negative.  Dentition and occlusion intact  Marked right TMJ tenderness and guarding Ears and Nose: No external deformity.  TM negative EXTR: No clubbing/cyanosis/edema NEURO: Normal gait.  PSYCH: Normally interactive. Conversant. Not depressed or anxious appearing.  Calm demeanor.    Assessment and Plan: TMJ dysfunction vicodin Valium Dentist Mouthguard  Signed,  Ellison Carwin, MD

## 2014-11-06 ENCOUNTER — Telehealth: Payer: Self-pay | Admitting: *Deleted

## 2014-11-06 NOTE — Telephone Encounter (Signed)
Call pt to resched apt. Lft VM with new apt date and time. Requested she call in if the apt does not fit her schedule.

## 2014-11-27 ENCOUNTER — Ambulatory Visit (INDEPENDENT_AMBULATORY_CARE_PROVIDER_SITE_OTHER): Payer: BC Managed Care – PPO | Admitting: Emergency Medicine

## 2014-11-27 VITALS — BP 114/68 | HR 100 | Temp 98.5°F | Resp 18 | Ht 59.0 in | Wt 142.0 lb

## 2014-11-27 DIAGNOSIS — A084 Viral intestinal infection, unspecified: Secondary | ICD-10-CM

## 2014-11-27 MED ORDER — ONDANSETRON 8 MG PO TBDP: 8.0000 mg | ORAL_TABLET | Freq: Three times a day (TID) | ORAL | Status: DC | PRN

## 2014-11-27 MED ORDER — LOPERAMIDE HCL 2 MG PO TABS: ORAL_TABLET | ORAL | Status: DC

## 2014-11-27 NOTE — Addendum Note (Signed)
Addended by: Constance Goltz on: 11/27/2014 01:05 PM   Modules accepted: Miquel Dunn

## 2014-11-27 NOTE — Progress Notes (Signed)
[facility] [address], [place] [address] [phone]  Date:  11/27/2014   Name:  Kimberly Guerra   DOB:  09-16-1986   MRN:  [id number]  PCP:  No PCP Per Patient    Chief Complaint: Sore Throat and Influenza   History of Present Illness:  Kimberly Guerra is a 28 y.o. very pleasant female patient who presents with the following:  Ill since yesterday with sudden onset of malaise, myalgias, sore throat and diarrhea.  Vomited once last night Feels hot. No cough or coryza.  The patient has no complaint of blood, mucous, or pus in her stools. No abdominal pain No improvement with over the counter medications or other home remedies.  Denies other complaint or health concern today.   Patient Active Problem List   Diagnosis Date Noted  . Migraines 02/08/2014    Past Medical History  Diagnosis Date  . Asthma   . Migraine   . Sickle cell trait   . Migraines 02/08/2014    Past Surgical History  Procedure Laterality Date  . Cesarean section  2010    History  Substance Use Topics  . Smoking status: Never Smoker   . Smokeless tobacco: Never Used  . Alcohol Use: Yes     Comment: occasional    Family History  Problem Relation Age of Onset  . Hypertension Mother   . Asthma Father   . Hypertension Father   . Asthma Brother   . Asthma Paternal Aunt   . Diabetes Paternal Aunt   . Cancer Maternal Grandfather     oral cancer  . Diabetes Maternal Grandfather   . Hypertension Maternal Grandfather     No Known Allergies  Medication list has been reviewed and updated.  Current Outpatient Prescriptions on File Prior to Visit  Medication Sig Dispense Refill  . albuterol (PROVENTIL HFA;VENTOLIN HFA) 108 (90 BASE) MCG/ACT inhaler Inhale 2 puffs into the lungs every 6 (six) hours as needed. For wheeze or shortness of breath 1 Inhaler 5  . amitriptyline (ELAVIL) 25 MG tablet Take half a pill daily at bedtime for 2 weeks, then one pill daily at  bedtime thereafter 30 tablet 3  . butalbital-acetaminophen-caffeine (FIORICET) 50-325-40 MG per tablet Take 1-2 tablets by mouth every 6 (six) hours as needed for headache. 20 tablet 0  . Fluticasone-Salmeterol (ADVAIR DISKUS) 250-50 MCG/DOSE AEPB Inhale 1 puff into the lungs 2 (two) times daily. 60 each 11  . naproxen sodium (ANAPROX DS) 550 MG tablet Take 1 tablet (550 mg total) by mouth 2 (two) times daily with a meal. 40 tablet 0  . promethazine (PHENERGAN) 12.5 MG tablet Take 1 tablet (12.5 mg total) by mouth every 8 (eight) hours as needed for nausea or vomiting. 30 tablet 1  . promethazine (PHENERGAN) 25 MG tablet Take 1 tablet (25 mg total) by mouth every 8 (eight) hours as needed for nausea or vomiting. 20 tablet 0  . diazepam (VALIUM) 2 MG tablet Take 1 tablet (2 mg total) by mouth every 6 (six) hours. (Patient not taking: Reported on 11/27/2014) 30 tablet 0  . HYDROcodone-acetaminophen (NORCO) 5-325 MG per tablet Take 1 tablet by mouth every 4 (four) hours as needed. (Patient not taking: Reported on 11/27/2014) 30 tablet 0   Current Facility-Administered Medications on File Prior to Visit  Medication Dose Route Frequency Provider Last Rate Last Dose  . promethazine (PHENERGAN) injection 25 mg  25 mg Intramuscular Once Thao P Le, DO  Review of Systems:  As per HPI, otherwise negative.    Physical Examination: Filed Vitals:   11/27/14 1208  BP: 114/68  Pulse: 100  Temp: 98.5 F (36.9 C)  Resp: 18   Filed Vitals:   11/27/14 1208  Height: 4\' 11"  (1.499 m)  Weight: 142 lb (64.411 kg)   Body mass index is 28.67 kg/(m^2). Ideal Body Weight: Weight in (lb) to have BMI = 25: 123.5  GEN: WDWN, NAD, Non-toxic, A & O x 3  Dry appearing HEENT: Atraumatic, Normocephalic. Neck supple. No masses, No LAD. Ears and Nose: No external deformity. CV: RRR, No M/G/R. No JVD. No thrill. No extra heart sounds. PULM: CTA B, no wheezes, crackles, rhonchi. No retractions. No resp.  distress. No accessory muscle use. ABD: S, NT, ND, +BS. No rebound. No HSM. EXTR: No c/c/e NEURO Normal gait.  PSYCH: Normally interactive. Conversant. Not depressed or anxious appearing.  Calm demeanor.    Assessment and Plan: Viral gastroenteritis zomig Imodium Clears  Signed,  Ellison Carwin, MD

## 2014-11-27 NOTE — Patient Instructions (Signed)
Viral Gastroenteritis Viral gastroenteritis is also known as stomach flu. This condition affects the stomach and intestinal tract. It can cause sudden diarrhea and vomiting. The illness typically lasts 3 to 8 days. Most people develop an immune response that eventually gets rid of the virus. While this natural response develops, the virus can make you quite ill. CAUSES  Many different viruses can cause gastroenteritis, such as rotavirus or noroviruses. You can catch one of these viruses by consuming contaminated food or water. You may also catch a virus by sharing utensils or other personal items with an infected person or by touching a contaminated surface. SYMPTOMS  The most common symptoms are diarrhea and vomiting. These problems can cause a severe loss of body fluids (dehydration) and a body salt (electrolyte) imbalance. Other symptoms may include:  Fever.  Headache.  Fatigue.  Abdominal pain. DIAGNOSIS  Your caregiver can usually diagnose viral gastroenteritis based on your symptoms and a physical exam. A stool sample may also be taken to test for the presence of viruses or other infections. TREATMENT  This illness typically goes away on its own. Treatments are aimed at rehydration. The most serious cases of viral gastroenteritis involve vomiting so severely that you are not able to keep fluids down. In these cases, fluids must be given through an intravenous line (IV). HOME CARE INSTRUCTIONS   Drink enough fluids to keep your urine clear or pale yellow. Drink small amounts of fluids frequently and increase the amounts as tolerated.  Ask your caregiver for specific rehydration instructions.  Avoid:  Foods high in sugar.  Alcohol.  Carbonated drinks.  Tobacco.  Juice.  Caffeine drinks.  Extremely hot or cold fluids.  Fatty, greasy foods.  Too much intake of anything at one time.  Dairy products until 24 to 48 hours after diarrhea stops.  You may consume probiotics.  Probiotics are active cultures of beneficial bacteria. They may lessen the amount and number of diarrheal stools in adults. Probiotics can be found in yogurt with active cultures and in supplements.  Wash your hands well to avoid spreading the virus.  Only take over-the-counter or prescription medicines for pain, discomfort, or fever as directed by your caregiver. Do not give aspirin to children. Antidiarrheal medicines are not recommended.  Ask your caregiver if you should continue to take your regular prescribed and over-the-counter medicines.  Keep all follow-up appointments as directed by your caregiver. SEEK IMMEDIATE MEDICAL CARE IF:   You are unable to keep fluids down.  You do not urinate at least once every 6 to 8 hours.  You develop shortness of breath.  You notice blood in your stool or vomit. This may look like coffee grounds.  You have abdominal pain that increases or is concentrated in one small area (localized).  You have persistent vomiting or diarrhea.  You have a fever.  The patient is a child younger than 3 months, and he or she has a fever.  The patient is a child older than 3 months, and he or she has a fever and persistent symptoms.  The patient is a child older than 3 months, and he or she has a fever and symptoms suddenly get worse.  The patient is a baby, and he or she has no tears when crying. MAKE SURE YOU:   Understand these instructions.  Will watch your condition.  Will get help right away if you are not doing well or get worse. Document Released: 12/14/2005 Document Revised: 03/07/2012 Document Reviewed: 09/30/2011   ExitCare Patient Information 2015 [employer]. This information is not intended to replace advice given to you by your health care provider. Make sure you discuss any questions you have with your health care provider. Clear Liquid Diet A clear liquid diet is a short-term diet that is prescribed to provide the necessary fluid and  basic energy you need when you can have nothing else. The clear liquid diet consists of liquids or solids that will become liquid at room temperature. You should be able to see through the liquid. There are many reasons that you may be restricted to clear liquids, such as:  When you have a sudden-onset (acute) condition that occurs before or after surgery.  To help your body slowly get adjusted to food again after a long period when you were unable to have food.  Replacement of fluids when you have a diarrheal disease.  When you are going to have certain exams, such as a colonoscopy, in which instruments are inserted inside your body to look at parts of your digestive system. WHAT CAN I HAVE? A clear liquid diet does not provide all the nutrients you need. It is important to choose a variety of the following items to get as many nutrients as possible:  Vegetable juices that do not have pulp.  Fruit juices and fruit drinks that do not have pulp.  Coffee (regular or decaffeinated), tea, or soda at the discretion of your health care provider.  Clear bouillon, broth, or strained broth-based soups.  High-protein and flavored gelatins.  Sugar or honey.  Ices or frozen ice pops that do not contain milk. If you are not sure whether you can have certain items, you should ask your health care provider. You may also ask your health care provider if there are any other clear liquid options. Document Released: 12/14/2005 Document Revised: 12/19/2013 Document Reviewed: 11/10/2013 ExitCare Patient Information 2015 [employer]. This information is not intended to replace advice given to you by your health care provider. Make sure you discuss any questions you have with your health care provider.  

## 2014-11-29 ENCOUNTER — Ambulatory Visit: Payer: BC Managed Care – PPO | Admitting: Neurology

## 2014-12-04 ENCOUNTER — Ambulatory Visit: Payer: BC Managed Care – PPO | Admitting: Neurology

## 2015-01-06 ENCOUNTER — Inpatient Hospital Stay (HOSPITAL_COMMUNITY)
Admission: AD | Admit: 2015-01-06 | Discharge: 2015-01-07 | Disposition: A | Discharge status: Home / Self Care | Payer: BLUE CROSS/BLUE SHIELD | Source: Ambulatory Visit | Attending: Obstetrics and Gynecology | Admitting: Obstetrics and Gynecology

## 2015-01-06 ENCOUNTER — Encounter (HOSPITAL_COMMUNITY): Payer: Self-pay

## 2015-01-06 ENCOUNTER — Inpatient Hospital Stay (HOSPITAL_COMMUNITY): Payer: BLUE CROSS/BLUE SHIELD

## 2015-01-06 DIAGNOSIS — R102 Pelvic and perineal pain: Secondary | ICD-10-CM

## 2015-01-06 DIAGNOSIS — N921 Excessive and frequent menstruation with irregular cycle: Secondary | ICD-10-CM | POA: Diagnosis not present

## 2015-01-06 DIAGNOSIS — N949 Unspecified condition associated with female genital organs and menstrual cycle: Secondary | ICD-10-CM | POA: Diagnosis not present

## 2015-01-06 DIAGNOSIS — N854 Malposition of uterus: Secondary | ICD-10-CM | POA: Diagnosis not present

## 2015-01-06 DIAGNOSIS — N920 Excessive and frequent menstruation with regular cycle: Secondary | ICD-10-CM | POA: Diagnosis not present

## 2015-01-06 DIAGNOSIS — R109 Unspecified abdominal pain: Secondary | ICD-10-CM | POA: Diagnosis present

## 2015-01-06 LAB — URINALYSIS, ROUTINE W REFLEX MICROSCOPIC
BILIRUBIN URINE: NEGATIVE
GLUCOSE, UA: NEGATIVE mg/dL
Ketones, ur: NEGATIVE mg/dL
Leukocytes, UA: NEGATIVE
Nitrite: NEGATIVE
PROTEIN: NEGATIVE mg/dL
Specific Gravity, Urine: 1.02 (ref 1.005–1.030)
UROBILINOGEN UA: 0.2 mg/dL (ref 0.0–1.0)
pH: 6 (ref 5.0–8.0)

## 2015-01-06 LAB — WET PREP, GENITAL
Clue Cells Wet Prep HPF POC: NONE SEEN
Trich, Wet Prep: NONE SEEN
Yeast Wet Prep HPF POC: NONE SEEN

## 2015-01-06 LAB — CBC
HEMATOCRIT: 33.3 % — AB (ref 36.0–46.0)
HEMOGLOBIN: 11.1 g/dL — AB (ref 12.0–15.0)
MCH: 26.1 pg (ref 26.0–34.0)
MCHC: 33.3 g/dL (ref 30.0–36.0)
MCV: 78.2 fL (ref 78.0–100.0)
Platelets: 390 10*3/uL (ref 150–400)
RBC: 4.26 MIL/uL (ref 3.87–5.11)
RDW: 14.9 % (ref 11.5–15.5)
WBC: 7.4 10*3/uL (ref 4.0–10.5)

## 2015-01-06 LAB — URINE MICROSCOPIC-ADD ON

## 2015-01-06 LAB — POCT PREGNANCY, URINE: Preg Test, Ur: NEGATIVE

## 2015-01-06 MED ORDER — KETOROLAC TROMETHAMINE 60 MG/2ML IM SOLN
60.0000 mg | Freq: Once | INTRAMUSCULAR | Status: AC
  Administered 2015-01-06: 60 mg via INTRAMUSCULAR
  Filled 2015-01-06: qty 2

## 2015-01-06 NOTE — MAU Note (Signed)
Last normal period 12/17. Started bleeding this past Friday, states heavy and has been bleeding through clothes with clots. Lower abdominal pain since yesterday that wraps around lower back . No birth control.

## 2015-01-06 NOTE — MAU Provider Note (Signed)
History     CSN: [id number]  Arrival date and time: 01/06/15 2053   First Provider Initiated Contact with Patient 01/06/15 2249      Chief Complaint  Patient presents with  . Abdominal Pain  . Vaginal Bleeding   HPI  Kimberly Guerra is a 29 y.o. G2P1011 who presents today with bleeding, cramping and back pain. She states that her period started on Friday, and that it was 9 days early. She states that she has had very heavy bleeding and passing clots. She states that she has not taken anything for the pain at this time. She also reports pain with urination.   Past Medical History  Diagnosis Date  . Asthma   . Migraine   . Sickle cell trait   . Migraines 02/08/2014    Past Surgical History  Procedure Laterality Date  . Cesarean section  2010    Family History  Problem Relation Age of Onset  . Hypertension Mother   . Asthma Father   . Hypertension Father   . Asthma Brother   . Asthma Paternal Aunt   . Diabetes Paternal Aunt   . Cancer Maternal Grandfather     oral cancer  . Diabetes Maternal Grandfather   . Hypertension Maternal Grandfather     History  Substance Use Topics  . Smoking status: Never Smoker   . Smokeless tobacco: Never Used  . Alcohol Use: Yes     Comment: occasional    Allergies: No Known Allergies  Facility-administered medications prior to admission  Medication Dose Route Frequency Provider Last Rate Last Dose  . promethazine (PHENERGAN) injection 25 mg  25 mg Intramuscular Once Thao P Le, DO       Prescriptions prior to admission  Medication Sig Dispense Refill Last Dose  . naproxen sodium (ANAPROX DS) 550 MG tablet Take 1 tablet (550 mg total) by mouth 2 (two) times daily with a meal. 40 tablet 0 Past Week at Unknown time  . albuterol (PROVENTIL HFA;VENTOLIN HFA) 108 (90 BASE) MCG/ACT inhaler Inhale 2 puffs into the lungs every 6 (six) hours as needed. For wheeze or shortness of breath 1 Inhaler 5 rescue  . amitriptyline (ELAVIL) 25 MG  tablet Take half a pill daily at bedtime for 2 weeks, then one pill daily at bedtime thereafter 30 tablet 3 01/04/2015  . butalbital-acetaminophen-caffeine (FIORICET) 50-325-40 MG per tablet Take 1-2 tablets by mouth every 6 (six) hours as needed for headache. 20 tablet 0 prn  . diazepam (VALIUM) 2 MG tablet Take 1 tablet (2 mg total) by mouth every 6 (six) hours. (Patient not taking: Reported on 11/27/2014) 30 tablet 0 Not Taking  . fluconazole (DIFLUCAN) 150 MG tablet Take 150 mg by mouth once.   0 12/31/2014  . Fluticasone-Salmeterol (ADVAIR DISKUS) 250-50 MCG/DOSE AEPB Inhale 1 puff into the lungs 2 (two) times daily. (Patient not taking: Reported on 01/06/2015) 60 each 11 Taking  . HYDROcodone-acetaminophen (NORCO) 5-325 MG per tablet Take 1 tablet by mouth every 4 (four) hours as needed. (Patient not taking: Reported on 11/27/2014) 30 tablet 0 Not Taking  . loperamide (IMODIUM A-D) 2 MG tablet 2 now and one hourly prn diarrhea.  Max 8 tabs in 24 hours (Patient not taking: Reported on 01/06/2015) 30 tablet 0   . ondansetron (ZOFRAN-ODT) 8 MG disintegrating tablet Take 1 tablet (8 mg total) by mouth every 8 (eight) hours as needed for nausea. (Patient not taking: Reported on 01/06/2015) 30 tablet 0   . promethazine (PHENERGAN)  12.5 MG tablet Take 1 tablet (12.5 mg total) by mouth every 8 (eight) hours as needed for nausea or vomiting. (Patient not taking: Reported on 01/06/2015) 30 tablet 1 Taking  . promethazine (PHENERGAN) 25 MG tablet Take 1 tablet (25 mg total) by mouth every 8 (eight) hours as needed for nausea or vomiting. (Patient not taking: Reported on 01/06/2015) 20 tablet 0 Taking    ROS Physical Exam   Blood pressure 152/96, pulse 83, temperature 99.2 F (37.3 C), temperature source Oral, resp. rate 18, height 4' 10.5" (1.486 m), weight 66.497 kg (146 lb 9.6 oz), last menstrual period 12/13/2014, SpO2 100 %.  Physical Exam  Nursing note and vitals reviewed. Constitutional: She is oriented to  person, place, and time. She appears well-developed and well-nourished. No distress.  Cardiovascular: Normal rate.   Respiratory: Effort normal.  GI: Soft. There is no tenderness. There is no rebound.  Genitourinary:   External: no lesion Vagina: small amount of blood seen  Cervix: pink, smooth, no CMT Uterus: NSSC Adnexa: NT   Neurological: She is alert and oriented to person, place, and time.  Skin: Skin is warm and dry.  Psychiatric: She has a normal mood and affect.    MAU Course  Procedures  Results for orders placed or performed during the hospital encounter of 01/06/15 (from the past 24 hour(s))  Urinalysis, Routine w reflex microscopic     Status: Abnormal   Collection Time: 01/06/15  9:15 PM  Result Value Ref Range   Color, Urine YELLOW YELLOW   APPearance CLEAR CLEAR   Specific Gravity, Urine 1.020 1.005 - 1.030   pH 6.0 5.0 - 8.0   Glucose, UA NEGATIVE NEGATIVE mg/dL   Hgb urine dipstick LARGE (A) NEGATIVE   Bilirubin Urine NEGATIVE NEGATIVE   Ketones, ur NEGATIVE NEGATIVE mg/dL   Protein, ur NEGATIVE NEGATIVE mg/dL   Urobilinogen, UA 0.2 0.0 - 1.0 mg/dL   Nitrite NEGATIVE NEGATIVE   Leukocytes, UA NEGATIVE NEGATIVE  Urine microscopic-add on     Status: Abnormal   Collection Time: 01/06/15  9:15 PM  Result Value Ref Range   Squamous Epithelial / LPF FEW (A) RARE   WBC, UA 3-6 <3 WBC/hpf   RBC / HPF 21-50 <3 RBC/hpf   Bacteria, UA FEW (A) RARE  Pregnancy, urine POC     Status: None   Collection Time: 01/06/15  9:28 PM  Result Value Ref Range   Preg Test, Ur NEGATIVE NEGATIVE  CBC     Status: Abnormal   Collection Time: 01/06/15  9:55 PM  Result Value Ref Range   WBC 7.4 4.0 - 10.5 K/uL   RBC 4.26 3.87 - 5.11 MIL/uL   Hemoglobin 11.1 (L) 12.0 - 15.0 g/dL   HCT 33.3 (L) 36.0 - 46.0 %   MCV 78.2 78.0 - 100.0 fL   MCH 26.1 26.0 - 34.0 pg   MCHC 33.3 30.0 - 36.0 g/dL   RDW 14.9 11.5 - 15.5 %   Platelets 390 150 - 400 K/uL  Wet prep, genital     Status:  Abnormal   Collection Time: 01/06/15 10:55 PM  Result Value Ref Range   Yeast Wet Prep HPF POC NONE SEEN NONE SEEN   Trich, Wet Prep NONE SEEN NONE SEEN   Clue Cells Wet Prep HPF POC NONE SEEN NONE SEEN   WBC, Wet Prep HPF POC FEW (A) NONE SEEN   US Transvaginal Non-ob  01/07/2015   CLINICAL DATA:  Pain and vaginal bleeding.  EXAM: TRANSABDOMINAL AND TRANSVAGINAL ULTRASOUND OF PELVIS  TECHNIQUE: Both transabdominal and transvaginal ultrasound examinations of the pelvis were performed. Transabdominal technique was performed for global imaging of the pelvis including uterus, ovaries, adnexal regions, and pelvic cul-de-sac. It was necessary to proceed with endovaginal exam following the transabdominal exam to visualize the endometrium, left and right ovary.  COMPARISON:  11/12/2011  FINDINGS: Uterus  Measurements: 8.0 x 4.5 x 4.5 cm. No fibroids or other mass visualized. The uterus is retroverted on transvaginal imaging.  Endometrium  Thickness: 9 mm.  Minimal fluid in the endometrial canal distally.  Right ovary  Measurements: 3.3 x 1.1 x 2.1 cm. Normal appearance/no adnexal mass. Blood flow is seen.  Left ovary  Measurements: 3.2 x 2.0 x 1.7 cm. Normal appearance/no adnexal mass. Blood flow seen.  Other findings  No free fluid.  IMPRESSION: 1. Trace fluid in the endometrial canal. Uterus is otherwise normal in appearance. 2. Normal appearance of both ovaries.   Electronically Signed   By: Jeb Levering M.D.   On: 01/07/2015 00:14   US Pelvis Complete  01/07/2015   CLINICAL DATA:  Pain and vaginal bleeding.  EXAM: TRANSABDOMINAL AND TRANSVAGINAL ULTRASOUND OF PELVIS  TECHNIQUE: Both transabdominal and transvaginal ultrasound examinations of the pelvis were performed. Transabdominal technique was performed for global imaging of the pelvis including uterus, ovaries, adnexal regions, and pelvic cul-de-sac. It was necessary to proceed with endovaginal exam following the transabdominal exam to visualize the  endometrium, left and right ovary.  COMPARISON:  11/12/2011  FINDINGS: Uterus  Measurements: 8.0 x 4.5 x 4.5 cm. No fibroids or other mass visualized. The uterus is retroverted on transvaginal imaging.  Endometrium  Thickness: 9 mm.  Minimal fluid in the endometrial canal distally.  Right ovary  Measurements: 3.3 x 1.1 x 2.1 cm. Normal appearance/no adnexal mass. Blood flow is seen.  Left ovary  Measurements: 3.2 x 2.0 x 1.7 cm. Normal appearance/no adnexal mass. Blood flow seen.  Other findings  No free fluid.  IMPRESSION: 1. Trace fluid in the endometrial canal. Uterus is otherwise normal in appearance. 2. Normal appearance of both ovaries.   Electronically Signed   By: Jeb Levering M.D.   On: 01/07/2015 00:14    0052: D/W Dr. Ulanda Edison, ok for dc home. WIll give 20 days of progesterone 10mg  FU with the office as needed  Assessment and Plan   1. Menorrhagia with irregular cycle   2. Pelvic pain in female    Provera 10mg  #20 Vicodin 5/325 #10, 0rf Return to MAU as needed  Follow-up Information    Follow up with Melina Schools, MD.   Specialty:  Obstetrics and Gynecology   Contact information:   [address], [address] [place] [address] [phone]        Mathis Bud 01/06/2015, 10:51 PM

## 2015-01-07 DIAGNOSIS — N921 Excessive and frequent menstruation with irregular cycle: Secondary | ICD-10-CM | POA: Diagnosis not present

## 2015-01-07 MED ORDER — HYDROCODONE-ACETAMINOPHEN 5-325 MG PO TABS: 1.0000 | ORAL_TABLET | Freq: Four times a day (QID) | ORAL | Status: DC | PRN

## 2015-01-07 MED ORDER — MEDROXYPROGESTERONE ACETATE 10 MG PO TABS: 10.0000 mg | ORAL_TABLET | Freq: Every day | ORAL | Status: DC

## 2015-01-07 NOTE — Discharge Instructions (Signed)

## 2015-01-08 LAB — GC/CHLAMYDIA PROBE AMP
CT PROBE, AMP APTIMA: NEGATIVE
GC PROBE AMP APTIMA: NEGATIVE

## 2015-01-08 LAB — HIV ANTIBODY (ROUTINE TESTING W REFLEX)
HIV 1/HIV 2 AB: NONREACTIVE
HIV 1/O/2 Abs-Index Value: <1 (ref <1.00)

## 2015-01-09 LAB — URINE CULTURE: Colony Count: 40000

## 2015-01-10 ENCOUNTER — Telehealth: Payer: Self-pay | Admitting: Physician Assistant

## 2015-01-10 ENCOUNTER — Other Ambulatory Visit: Payer: Self-pay | Admitting: Medical

## 2015-01-10 NOTE — Telephone Encounter (Signed)
Unable to reach pt by phone today.  She was called to discuss urine culture results : Ecoli with 40,000 colonies

## 2015-02-07 ENCOUNTER — Encounter (HOSPITAL_COMMUNITY): Payer: Self-pay | Admitting: Emergency Medicine

## 2015-02-07 ENCOUNTER — Emergency Department (HOSPITAL_COMMUNITY): Payer: BLUE CROSS/BLUE SHIELD

## 2015-02-07 ENCOUNTER — Emergency Department (HOSPITAL_COMMUNITY)
Admission: EM | Admit: 2015-02-07 | Discharge: 2015-02-07 | Disposition: A | Discharge status: Home / Self Care | Payer: BLUE CROSS/BLUE SHIELD | Attending: Emergency Medicine | Admitting: Emergency Medicine

## 2015-02-07 DIAGNOSIS — Z862 Personal history of diseases of the blood and blood-forming organs and certain disorders involving the immune mechanism: Secondary | ICD-10-CM | POA: Insufficient documentation

## 2015-02-07 DIAGNOSIS — G43909 Migraine, unspecified, not intractable, without status migrainosus: Secondary | ICD-10-CM | POA: Diagnosis not present

## 2015-02-07 DIAGNOSIS — Z791 Long term (current) use of non-steroidal anti-inflammatories (NSAID): Secondary | ICD-10-CM | POA: Diagnosis not present

## 2015-02-07 DIAGNOSIS — S39012A Strain of muscle, fascia and tendon of lower back, initial encounter: Secondary | ICD-10-CM | POA: Diagnosis not present

## 2015-02-07 DIAGNOSIS — Z7951 Long term (current) use of inhaled steroids: Secondary | ICD-10-CM | POA: Insufficient documentation

## 2015-02-07 DIAGNOSIS — Z79899 Other long term (current) drug therapy: Secondary | ICD-10-CM | POA: Insufficient documentation

## 2015-02-07 DIAGNOSIS — Y998 Other external cause status: Secondary | ICD-10-CM | POA: Diagnosis not present

## 2015-02-07 DIAGNOSIS — W06XXXA Fall from bed, initial encounter: Secondary | ICD-10-CM | POA: Insufficient documentation

## 2015-02-07 DIAGNOSIS — M5430 Sciatica, unspecified side: Secondary | ICD-10-CM | POA: Diagnosis not present

## 2015-02-07 DIAGNOSIS — S3992XA Unspecified injury of lower back, initial encounter: Secondary | ICD-10-CM | POA: Diagnosis present

## 2015-02-07 DIAGNOSIS — J45909 Unspecified asthma, uncomplicated: Secondary | ICD-10-CM | POA: Insufficient documentation

## 2015-02-07 DIAGNOSIS — Y92092 Bedroom in other non-institutional residence as the place of occurrence of the external cause: Secondary | ICD-10-CM | POA: Diagnosis not present

## 2015-02-07 DIAGNOSIS — Y9389 Activity, other specified: Secondary | ICD-10-CM | POA: Diagnosis not present

## 2015-02-07 MED ORDER — KETOROLAC TROMETHAMINE 30 MG/ML IJ SOLN
30.0000 mg | Freq: Once | INTRAMUSCULAR | Status: AC
Start: 2015-02-07 — End: 2015-02-07
  Administered 2015-02-07: 30 mg via INTRAVENOUS
  Filled 2015-02-07: qty 1

## 2015-02-07 MED ORDER — CYCLOBENZAPRINE HCL 10 MG PO TABS: 10.0000 mg | ORAL_TABLET | Freq: Two times a day (BID) | ORAL | Status: DC | PRN

## 2015-02-07 MED ORDER — DIAZEPAM 5 MG/ML IJ SOLN
5.0000 mg | Freq: Once | INTRAMUSCULAR | Status: AC
  Administered 2015-02-07: 5 mg via INTRAVENOUS
  Filled 2015-02-07: qty 2

## 2015-02-07 NOTE — ED Notes (Signed)
Bed: VJ50 Expected date:  Expected time:  Means of arrival:  Comments: EMS 29yo sciatic pain

## 2015-02-07 NOTE — Discharge Instructions (Signed)
Back Pain, Adult °Low back pain is very common. About 1 in 5 people have back pain. The cause of low back pain is rarely dangerous. The pain often gets better over time. About half of people with a sudden onset of back pain feel better in just 2 weeks. About 8 in 10 people feel better by 6 weeks.  °CAUSES °Some common causes of back pain include: °· Strain of the muscles or ligaments supporting the spine. °· Wear and tear (degeneration) of the spinal discs. °· Arthritis. °· Direct injury to the back. °DIAGNOSIS °Most of the time, the direct cause of low back pain is not known. However, back pain can be treated effectively even when the exact cause of the pain is unknown. Answering your caregiver's questions about your overall health and symptoms is one of the most accurate ways to make sure the cause of your pain is not dangerous. If your caregiver needs more information, he or she may order lab work or imaging tests (X-rays or MRIs). However, even if imaging tests show changes in your back, this usually does not require surgery. °HOME CARE INSTRUCTIONS °For many people, back pain returns. Since low back pain is rarely dangerous, it is often a condition that people can learn to manage on their own.  °· Remain active. It is stressful on the back to sit or stand in one place. Do not sit, drive, or stand in one place for more than 30 minutes at a time. Take short walks on level surfaces as soon as pain allows. Try to increase the length of time you walk each day. °· Do not stay in bed. Resting more than 1 or 2 days can delay your recovery. °· Do not avoid exercise or work. Your body is made to move. It is not dangerous to be active, even though your back may hurt. Your back will likely heal faster if you return to being active before your pain is gone. °· Pay attention to your body when you  bend and lift. Many people have less discomfort when lifting if they bend their knees, keep the load close to their bodies, and  avoid twisting. Often, the most comfortable positions are those that put less stress on your recovering back. °· Find a comfortable position to sleep. Use a firm mattress and lie on your side with your knees slightly bent. If you lie on your back, put a pillow under your knees. °· Only take over-the-counter or prescription medicines as directed by your caregiver. Over-the-counter medicines to reduce pain and inflammation are often the most helpful. Your caregiver may prescribe muscle relaxant drugs. These medicines help dull your pain so you can more quickly return to your normal activities and healthy exercise. °· Put ice on the injured area. °¨ Put ice in a plastic bag. °¨ Place a towel between your skin and the bag. °¨ Leave the ice on for 15-20 minutes, 03-04 times a day for the first 2 to 3 days. After that, ice and heat may be alternated to reduce pain and spasms. °· Ask your caregiver about trying back exercises and gentle massage. This may be of some benefit. °· Avoid feeling anxious or stressed. Stress increases muscle tension and can worsen back pain. It is important to recognize when you are anxious or stressed and learn ways to manage it. Exercise is a great option. °SEEK MEDICAL CARE IF: °· You have pain that is not relieved with rest or medicine. °· You have pain that does not improve in 1 week. °· You have new symptoms. °· You are generally not feeling well. °SEEK   IMMEDIATE MEDICAL CARE IF:   You have pain that radiates from your back into your legs.  You develop new bowel or bladder control problems.  You have unusual weakness or numbness in your arms or legs.  You develop nausea or vomiting.  You develop abdominal pain.  You feel faint. Document Released: 12/14/2005 Document Revised: 06/14/2012 Document Reviewed: 04/17/2014 [facility] Patient Information 2015 [place], [place]. This information is not intended to replace advice given to you by your health care provider. Make sure you  discuss any questions you have with your health care provider. Sciatica Sciatica is pain, weakness, numbness, or tingling along the path of the sciatic nerve. The nerve starts in the lower back and runs down the back of each leg. The nerve controls the muscles in the lower leg and in the back of the knee, while also providing sensation to the back of the thigh, lower leg, and the sole of your foot. Sciatica is a symptom of another medical condition. For instance, nerve damage or certain conditions, such as a herniated disk or bone spur on the spine, pinch or put pressure on the sciatic nerve. This causes the pain, weakness, or other sensations normally associated with sciatica. Generally, sciatica only affects one side of the body. CAUSES   Herniated or slipped disc.  Degenerative disk disease.  A pain disorder involving the narrow muscle in the buttocks (piriformis syndrome).  Pelvic injury or fracture.  Pregnancy.  Tumor (rare). SYMPTOMS  Symptoms can vary from mild to very severe. The symptoms usually travel from the low back to the buttocks and down the back of the leg. Symptoms can include:  Mild tingling or dull aches in the lower back, leg, or hip.  Numbness in the back of the calf or sole of the foot.  Burning sensations in the lower back, leg, or hip.  Sharp pains in the lower back, leg, or hip.  Leg weakness.  Severe back pain inhibiting movement. These symptoms may get worse with coughing, sneezing, laughing, or prolonged sitting or standing. Also, being overweight may worsen symptoms. DIAGNOSIS  Your caregiver will perform a physical exam to look for common symptoms of sciatica. He or she may ask you to do certain movements or activities that would trigger sciatic nerve pain. Other tests may be performed to find the cause of the sciatica. These may include:  Blood tests.  X-rays.  Imaging tests, such as an MRI or CT scan. TREATMENT  Treatment is directed at the  cause of the sciatic pain. Sometimes, treatment is not necessary and the pain and discomfort goes away on its own. If treatment is needed, your caregiver may suggest:  Over-the-counter medicines to relieve pain.  Prescription medicines, such as anti-inflammatory medicine, muscle relaxants, or narcotics.  Applying heat or ice to the painful area.  Steroid injections to lessen pain, irritation, and inflammation around the nerve.  Reducing activity during periods of pain.  Exercising and stretching to strengthen your abdomen and improve flexibility of your spine. Your caregiver may suggest losing weight if the extra weight makes the back pain worse.  Physical therapy.  Surgery to eliminate what is pressing or pinching the nerve, such as a bone spur or part of a herniated disk. HOME CARE INSTRUCTIONS   Only take over-the-counter or prescription medicines for pain or discomfort as directed by your caregiver.  Apply ice to the affected area for 20 minutes, 3-4 times a day for the first 48-72 hours. Then try heat in the  same way.  Exercise, stretch, or perform your usual activities if these do not aggravate your pain.  Attend physical therapy sessions as directed by your caregiver.  Keep all follow-up appointments as directed by your caregiver.  Do not wear high heels or shoes that do not provide proper support.  Check your mattress to see if it is too soft. A firm mattress may lessen your pain and discomfort. SEEK IMMEDIATE MEDICAL CARE IF:   You lose control of your bowel or bladder (incontinence).  You have increasing weakness in the lower back, pelvis, buttocks, or legs.  You have redness or swelling of your back.  You have a burning sensation when you urinate.  You have pain that gets worse when you lie down or awakens you at night.  Your pain is worse than you have experienced in the past.  Your pain is lasting longer than 4 weeks.  You are suddenly losing weight  without reason. MAKE SURE YOU:  Understand these instructions.  Will watch your condition.  Will get help right away if you are not doing well or get worse. Document Released: 12/08/2001 Document Revised: 06/14/2012 Document Reviewed: 04/24/2012 [facility] Patient Information 2015 [place], [place]. This information is not intended to replace advice given to you by your health care provider. Make sure you discuss any questions you have with your health care provider.

## 2015-02-07 NOTE — ED Provider Notes (Signed)
Medical screening examination/treatment/procedure(s) were conducted as a shared visit with non-physician practitioner(s) and myself.  I personally evaluated the patient during the encounter.   EKG Interpretation None      Results for orders placed or performed during the hospital encounter of 01/06/15  Wet prep, genital  Result Value Ref Range   Yeast Wet Prep HPF POC NONE SEEN NONE SEEN   Trich, Wet Prep NONE SEEN NONE SEEN   Clue Cells Wet Prep HPF POC NONE SEEN NONE SEEN   WBC, Wet Prep HPF POC FEW (A) NONE SEEN  GC/Chlamydia Probe Amp  Result Value Ref Range   CT Probe RNA NEGATIVE NEGATIVE   GC Probe RNA NEGATIVE NEGATIVE  Urine culture  Result Value Ref Range   Specimen Description URINE, CLEAN CATCH    Special Requests NONE    Colony Count      40,000 COLONIES/ML Performed at [place] Performed at [facility]    Report Status 01/09/2015 FINAL    Organism ID, Bacteria ESCHERICHIA COLI       Susceptibility   Escherichia coli - MIC*    AMPICILLIN >=32 RESISTANT Resistant     CEFAZOLIN <=4 SENSITIVE Sensitive     CEFTRIAXONE <=1 SENSITIVE Sensitive     CIPROFLOXACIN <=0.25 SENSITIVE Sensitive     GENTAMICIN <=1 SENSITIVE Sensitive     LEVOFLOXACIN 0.5 SENSITIVE Sensitive     NITROFURANTOIN <=16 SENSITIVE Sensitive     TOBRAMYCIN <=1 SENSITIVE Sensitive     TRIMETH/SULFA >=320 RESISTANT Resistant     PIP/TAZO <=4 SENSITIVE Sensitive     * ESCHERICHIA COLI  Urinalysis, Routine w reflex microscopic  Result Value Ref Range   Color, Urine YELLOW YELLOW   APPearance CLEAR CLEAR   Specific Gravity, Urine 1.020 1.005 - 1.030   pH 6.0 5.0 - 8.0   Glucose, UA NEGATIVE NEGATIVE mg/dL   Hgb urine dipstick LARGE (A) NEGATIVE   Bilirubin Urine NEGATIVE NEGATIVE   Ketones, ur NEGATIVE NEGATIVE mg/dL   Protein, ur NEGATIVE NEGATIVE mg/dL   Urobilinogen, UA 0.2 0.0 - 1.0 mg/dL   Nitrite NEGATIVE NEGATIVE   Leukocytes,  UA NEGATIVE NEGATIVE  Urine microscopic-add on  Result Value Ref Range   Squamous Epithelial / LPF FEW (A) RARE   WBC, UA 3-6 <3 WBC/hpf   RBC / HPF 21-50 <3 RBC/hpf   Bacteria, UA FEW (A) RARE  CBC  Result Value Ref Range   WBC 7.4 4.0 - 10.5 K/uL   RBC 4.26 3.87 - 5.11 MIL/uL   Hemoglobin 11.1 (L) 12.0 - 15.0 g/dL   HCT 33.3 (L) 36.0 - 46.0 %   MCV 78.2 78.0 - 100.0 fL   MCH 26.1 26.0 - 34.0 pg   MCHC 33.3 30.0 - 36.0 g/dL   RDW 14.9 11.5 - 15.5 %   Platelets 390 150 - 400 K/uL  HIV antibody  Result Value Ref Range   HIV 1/O/2 Abs-Index Value <1.00 <1.00   HIV-1/HIV-2 Ab Non Reactive Non Reactive  Pregnancy, urine POC  Result Value Ref Range   Preg Test, Ur NEGATIVE NEGATIVE   Dg Lumbar Spine Complete  02/07/2015   CLINICAL DATA:  Low back pain and left buttock and leg pain secondary to a fall this morning.  EXAM: LUMBAR SPINE - COMPLETE 4+ VIEW  COMPARISON:  None.  FINDINGS: There is no evidence of lumbar spine fracture. Alignment is normal. Intervertebral disc spaces are  maintained.  IMPRESSION: Normal exam.   Electronically Signed   By: Lorriane Shire M.D.   On: 02/07/2015 07:33    X-rays of the back here today are negative. Patient with difficulties with cramping associated with menstrual periods [facility] without any significant findings. Patient was experiencing that pain then suddenly got pain in the back low back area of left the buttocks area and then radiating to the left leg. This pain was severe of resulting in her dropping to her knees. When getting out of bed. Symptoms seem to be consistent with sciatica. No significant neuro deficit to the left foot. Symptomatic treatment would be appropriate. If it persists to consideration for MRI would be appropriate.  Fredia Sorrow, MD 02/07/15 [phone]

## 2015-02-07 NOTE — ED Provider Notes (Signed)
CSN: [id number]     Arrival date & time 02/07/15  8101 History   First MD Initiated Contact with Patient 02/07/15 [phone]     Chief Complaint  Patient presents with  . Back Pain     (Consider location/radiation/quality/duration/timing/severity/associated sxs/prior Treatment) HPI Comments: Patient presents to the ED with low back pain.  She states that she fell this morning getting out of bed.  She reports pain in her low back that runs down her leg.  She denies any numbness or weakness.  She denies any bowel or bladder incontinence.  Denies any dysuria.  She states that she is on her period.  She states that she has been having increasing menstrual cramps over the past couple months.  She states that she is being followed by OBGYN for this.  She states that she wants to be certain she didn't break anything.  The history is provided by the patient. No language interpreter was used.    Past Medical History  Diagnosis Date  . Asthma   . Migraine   . Sickle cell trait   . Migraines 02/08/2014   Past Surgical History  Procedure Laterality Date  . Cesarean section  2010   Family History  Problem Relation Age of Onset  . Hypertension Mother   . Asthma Father   . Hypertension Father   . Asthma Brother   . Asthma Paternal Aunt   . Diabetes Paternal Aunt   . Cancer Maternal Grandfather     oral cancer  . Diabetes Maternal Grandfather   . Hypertension Maternal Grandfather    History  Substance Use Topics  . Smoking status: Never Smoker   . Smokeless tobacco: Never Used  . Alcohol Use: Yes     Comment: occasional   OB History    Gravida Para Term Preterm AB TAB SAB Ectopic Multiple Living   2 1 1  1 1    1      Review of Systems  Constitutional: Negative for fever and chills.  Respiratory: Negative for shortness of breath.   Cardiovascular: Negative for chest pain.  Gastrointestinal: Negative for nausea, vomiting, diarrhea and constipation.       No bowel incontinence   Genitourinary: Negative for dysuria.       No urinary incontinence  Musculoskeletal: Positive for myalgias, back pain and arthralgias.  Neurological:       No saddle anesthesia  All other systems reviewed and are negative.     Allergies  Review of patient's allergies indicates no known allergies.  Home Medications   Prior to Admission medications   Medication Sig Start Date End Date Taking? Authorizing Provider  albuterol (PROVENTIL HFA;VENTOLIN HFA) 108 (90 BASE) MCG/ACT inhaler Inhale 2 puffs into the lungs every 6 (six) hours as needed. For wheeze or shortness of breath 06/12/14  Yes Thao P Le, DO  amitriptyline (ELAVIL) 25 MG tablet Take half a pill daily at bedtime for 2 weeks, then one pill daily at bedtime thereafter 08/07/14  Yes Star Age, MD  naproxen sodium (ANAPROX DS) 550 MG tablet Take 1 tablet (550 mg total) by mouth 2 (two) times daily with a meal. 09/13/14 09/13/15 Yes Roselee Culver, MD  promethazine (PHENERGAN) 25 MG tablet Take 25 mg by mouth every 6 (six) hours as needed for nausea or vomiting.   Yes Historical Provider, MD  traMADol (ULTRAM) 50 MG tablet Take 50 mg by mouth every 6 (six) hours as needed for moderate pain.   Yes Historical Provider,  MD  butalbital-acetaminophen-caffeine (FIORICET) 50-325-40 MG per tablet Take 1-2 tablets by mouth every 6 (six) hours as needed for headache. Patient not taking: Reported on 02/07/2015 06/12/14 06/12/15  Thao P Le, DO  Fluticasone-Salmeterol (ADVAIR DISKUS) 250-50 MCG/DOSE AEPB Inhale 1 puff into the lungs 2 (two) times daily. Patient not taking: Reported on 01/06/2015 06/12/14   Thao P Le, DO  HYDROcodone-acetaminophen (NORCO/VICODIN) 5-325 MG per tablet Take 1-2 tablets by mouth every 6 (six) hours as needed for moderate pain. Patient not taking: Reported on 02/07/2015 01/07/15   Mathis Bud, CNM  medroxyPROGESTERone (PROVERA) 10 MG tablet Take 1 tablet (10 mg total) by mouth daily. Patient not taking: Reported  on 02/07/2015 01/07/15   Mathis Bud, CNM   BP 131/72 mmHg  Pulse 82  Temp(Src) 99 F (37.2 C) (Oral)  Resp 16  SpO2 96% Physical Exam  Constitutional: She is oriented to person, place, and time. She appears well-developed and well-nourished. No distress.  HENT:  Head: Normocephalic and atraumatic.  Eyes: Conjunctivae and EOM are normal. Right eye exhibits no discharge. Left eye exhibits no discharge. No scleral icterus.  Neck: Normal range of motion. Neck supple. No tracheal deviation present.  Cardiovascular: Normal rate, regular rhythm and normal heart sounds.  Exam reveals no gallop and no friction rub.   No murmur heard. Pulmonary/Chest: Effort normal and breath sounds normal. No respiratory distress. She has no wheezes.  Abdominal: Soft. She exhibits no distension. There is no tenderness.  No focal abdominal tenderness, no RLQ tenderness or pain at McBurney's point, no RUQ tenderness or Murphy's sign, no left-sided abdominal tenderness, no fluid wave, or signs of peritonitis   Musculoskeletal: Normal range of motion.  Left lumbar paraspinal muscles tender to palpation, no bony tenderness, step-offs, or gross abnormality or deformity of spine, patient is able to ambulate, moves all extremities  Bilateral great toe extension intact Bilateral plantar/dorsiflexion intact  Neurological: She is alert and oriented to person, place, and time. She has normal reflexes.  Sensation and strength intact bilaterally Symmetrical reflexes  Skin: Skin is warm. She is not diaphoretic.  Psychiatric: She has a normal mood and affect. Her behavior is normal. Judgment and thought content normal.  Nursing note and vitals reviewed.   ED Course  Procedures (including critical care time) Labs Review Labs Reviewed - No data to display  Imaging Review Dg Lumbar Spine Complete  02/07/2015   CLINICAL DATA:  Low back pain and left buttock and leg pain secondary to a fall this morning.  EXAM:  LUMBAR SPINE - COMPLETE 4+ VIEW  COMPARISON:  None.  FINDINGS: There is no evidence of lumbar spine fracture. Alignment is normal. Intervertebral disc spaces are maintained.  IMPRESSION: Normal exam.   Electronically Signed   By: Lorriane Shire M.D.   On: 02/07/2015 07:33     EKG Interpretation None      MDM   Final diagnoses:  Lumbar strain, initial encounter  Sciatica, unspecified laterality    Patient with back pain.  Improved with toradol and valium. No neurological deficits and normal neuro exam.  Patient is ambulatory.  No loss of bowel or bladder control.  Doubt cauda equina.  Denies fever,  doubt epidural abscess or other lesion. Recommend back exercises, stretching, RICE, and will treat with a short course of flexeril.  Seen by and discussed with Dr. Rogene Houston, who agrees with plan.  No fevers, chills, vomiting, diarrhea.  Encouraged the patient that there could be a need for additional  workup and/or imaging such as MRI, if the symptoms do not resolve. Patient advised that if the back pain does not resolve, or radiates, this could progress to more serious conditions and is encouraged to follow-up with PCP or orthopedics within 2 weeks.       Montine Circle, PA-C 02/07/15 [phone]

## 2015-02-07 NOTE — ED Notes (Signed)
Pt c/o lower back pain that radiates down left buttock and left leg. Pt is alert and oriented. Pt given 122mcg Fentanyl in route.

## 2015-02-07 NOTE — ED Notes (Signed)
Patient ambulated to door of room with some pain, but no assistance.

## 2015-02-07 NOTE — Progress Notes (Signed)
Pt states she is seen at [facility] urgent care by various providers. Per [facility] last seen by Dr Malva Cogan at [facility] care pcp updated  Confirms OB GYN is MEISINGER, TODD  Good Support system Two females and a female at bedside

## 2015-02-07 NOTE — ED Notes (Addendum)
Patient presents with c/o bilateral cramping low back pain, L>R, radiating into hips and legs.  Patient denies injury to low back or extremities.  Patient denies tingling, but endorses mild numbness in LLE.  Patient states movement increases pain significantly.  Patient also c/o lower abdominal pain and cramping with vaginal bleeding which she relates to her menstrual cycle which began 3 days ago.  Patient denies N/V/D and fever.  Patient denies urinary s/s such as hematuria, dysuria and urinary frequency.  Patient states she had an episode similar to this last month and was seen at [facility].  She was found to have had a UTI and was placed on abx.  Patient states she finished the course of abx.  On exam, patient's lung sounds are clear in all lobes.  Heart rate regular, S1/S2 and no murmurs heard.  Bowel sounds hyperactive.  Patient's abdomen is soft and mildly tender to palpation.  Patient is able to MAE without difficulty and feels light touch in lower extremities.  Patient denies loss of bowel/bladder control.

## 2015-02-08 ENCOUNTER — Telehealth: Payer: Self-pay | Admitting: Neurology

## 2015-02-08 ENCOUNTER — Ambulatory Visit: Payer: BC Managed Care – PPO | Admitting: Neurology

## 2015-02-08 NOTE — Telephone Encounter (Signed)
Patient was a No Show for today's appointment at 11:15

## 2015-02-13 ENCOUNTER — Encounter (HOSPITAL_BASED_OUTPATIENT_CLINIC_OR_DEPARTMENT_OTHER): Payer: Self-pay | Admitting: Emergency Medicine

## 2015-02-14 ENCOUNTER — Encounter: Payer: Self-pay | Admitting: Neurology

## 2015-03-18 ENCOUNTER — Other Ambulatory Visit: Payer: Self-pay | Admitting: Emergency Medicine

## 2015-03-19 ENCOUNTER — Encounter (HOSPITAL_BASED_OUTPATIENT_CLINIC_OR_DEPARTMENT_OTHER): Payer: Self-pay | Admitting: *Deleted

## 2015-03-19 ENCOUNTER — Other Ambulatory Visit: Payer: Self-pay | Admitting: Orthopedic Surgery

## 2015-03-20 ENCOUNTER — Ambulatory Visit (INDEPENDENT_AMBULATORY_CARE_PROVIDER_SITE_OTHER): Payer: BLUE CROSS/BLUE SHIELD | Admitting: Physician Assistant

## 2015-03-20 VITALS — BP 94/72 | HR 88 | Temp 98.8°F | Resp 16 | Ht <= 58 in | Wt 142.6 lb

## 2015-03-20 DIAGNOSIS — A084 Viral intestinal infection, unspecified: Secondary | ICD-10-CM | POA: Diagnosis not present

## 2015-03-20 MED ORDER — ONDANSETRON HCL 4 MG PO TABS: 4.0000 mg | ORAL_TABLET | Freq: Three times a day (TID) | ORAL | Status: DC | PRN

## 2015-03-20 NOTE — Patient Instructions (Signed)
Eat ice chips and if can tolerate for 1 hour may upgrade to small sips of water for an 1 hour. If that tolerates may drink more water at a time.  Ibuprofen for fever.

## 2015-03-20 NOTE — H&P (Signed)
Kimberly Guerra is an 29 y.o. female.   CC / Reason for Visit: Right wrist and thumb pain HPI: This patient returns for reevaluation having undergone an interval electrodiagnostic studies revealing right mild carpal tunnel syndrome and electrically within normal limits on the left.  The patient confirms that her numbness and tingling are worse on the right side.  She has misplaced her brace for de Quervain's, for about a week and a half, and has had some increased symptoms.  She cannot use NSAIDs because of migraines.  She reports having not had steroids in the past.  HPI 01-21-15: This patient returns today for reevaluation of her de Quervain's on the right side as well as her possible bilateral carpal tunnel.  The patient states that her de Quervain's symptoms are significantly better and that she has been stretching as well as icing and wearing the splint.  She does state that she has increased numbness and tingling into her thumb index long and half of her ring at night.   Presenting history is as follows: A 29 year old, RHD, female patient was seen today for pain and numbness and tingling into her right wrist and thumb as well as some symptoms into the left hand and fingers.  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  She states that the discomfort also wakes her at night.  Past Medical History  Diagnosis Date  . Sickle cell trait   . Migraines   . Asthma     prn inhaler  . Carpal tunnel syndrome of right wrist 02/2015  . De Quervain's tenosynovitis, right 02/2015    Past Surgical History  Procedure Laterality Date  . Cesarean section  01/02/2009    Family History  Problem Relation Age of Onset  . Hypertension Mother   . Asthma Father   . Hypertension Father   . Asthma Brother   . Asthma Paternal Aunt   . Diabetes Paternal Aunt   . Cancer Maternal Grandfather     oral cancer  . Diabetes Maternal  Grandfather   . Hypertension Maternal Grandfather    Social History:  reports that she has never smoked. She has never used smokeless tobacco. She reports that she drinks alcohol. She reports that she does not use illicit drugs.  Allergies: No Known Allergies  No prescriptions prior to admission    No results found for this or any previous visit (from the past 48 hour(s)). No results found.  Review of Systems  All other systems reviewed and are negative.   Height 4\' 10"  (1.473 m), weight 63.504 kg (140 lb), last menstrual period 03/08/2015. Physical Exam  Constitutional:  WD, WN, NAD HEENT:  NCAT, EOMI Neuro/Psych:  Alert & oriented to person, place, and time; appropriate mood & affect Lymphatic: No generalized UE edema or lymphadenopathy Extremities / MSK:  Both UE are normal with respect to appearance, ranges of motion, joint stability, muscle strength/tone, sensation, & perfusion except as otherwise noted:  There is swelling and tenderness over the first dorsal compartment tendons.  Finkelstein's is weakly positive.  Patient complains of altered sensibility in her thumb index long and radial aspect of ring fingers.  Monofilaments right thumb index long and small 2.83 ring radial side 4.31 ulnar 3.61.  Left thumb through small 2.83.  Grip strength right 25 left 65 in position #2. Positive Tinel's on the right.  Positive compression at carpal tunnel on the right.  Labs / X-rays:  No radiographic studies obtained today.  Assessment: 1.  Persisting De Quervain's tendinitis right 2.  Right carpal tunnel syndrome  Plan:  These results were discussed with the patient.  She would like to proceed surgically with both problems on the right side.  She is prescribed a prednisone Dosepak today, to help get her through to her preferred timing for surgery which is towards the end of this month.  We will plan to proceed with a right wrist de Quervain's release and endoscopic carpal tunnel  release in the same setting under [place] anesthesia.  The details of the operative procedure were discussed with the patient.  Questions were invited and answered.  In addition to the goal of the procedure, the risks of the procedure to include but not limited to bleeding; infection; damage to the nerves or blood vessels that could result in bleeding, numbness, weakness, chronic pain, and the need for additional procedures; stiffness; the need for revision surgery; and anesthetic risks, were reviewed.  No specific outcome was guaranteed or implied.  Informed consent was obtained.  Hilding Quintanar A. 03/20/2015, 6:07 PM

## 2015-03-20 NOTE — Progress Notes (Signed)
   Subjective:    Patient ID: Kimberly Guerra, female    DOB: 10-13-1986, 29 y.o.   MRN: [id number]  HPI Patient presents for nausea and vomiting that started last night and was sent home from work. Has had 5 episodes of vomiting since last night and has now had 2 episodes of diarrhea this morning. All food and liquid comes back up. Had temperature of 101.2 degrees last night which was relieved by tylenol. Stomach cramps, but denies flatulence. Additionally endorses myalgias, chills, and HA. Denies constipation, URI sx, UTI sx. Denies sick contacts, does not eat undercooked/raw foods, and has not eaten at any restaurants. Abdominal surgeries include cesarean section. LMP 03/08/15 was normal. NKDA.    Review of Systems  Constitutional: Positive for fever, chills, activity change and appetite change. Negative for diaphoresis and fatigue.  HENT: Negative.  Negative for congestion, rhinorrhea, sinus pressure and sore throat.   Respiratory: Negative for shortness of breath.   Cardiovascular: Negative for chest pain.  Gastrointestinal: Positive for nausea, vomiting, abdominal pain and diarrhea. Negative for constipation, blood in stool and abdominal distention.  Genitourinary: Negative.  Negative for dysuria, frequency, flank pain, menstrual problem and pelvic pain.  Neurological: Negative for dizziness, light-headedness and headaches.       Objective:   Physical Exam  Constitutional: She is oriented to person, place, and time. She appears well-developed and well-nourished. No distress.  Blood pressure 94/72, pulse 88, temperature 98.8 F (37.1 C), temperature source Oral, resp. rate 16, height 4\' 10"  (1.473 m), weight 142 lb 9.6 oz (64.683 kg), last menstrual period 03/08/2015, SpO2 97 %.  HENT:  Head: Normocephalic and atraumatic.  Right Ear: External ear normal.  Left Ear: External ear normal.  Mouth/Throat: Oropharynx is clear and moist. No oropharyngeal exudate.  Eyes: Conjunctivae are  normal. Pupils are equal, round, and reactive to light. Right eye exhibits no discharge. Left eye exhibits no discharge. No scleral icterus.  Neck: Neck supple. No thyromegaly present.  Cardiovascular: Normal rate, regular rhythm and normal heart sounds.  Exam reveals no gallop and no friction rub.   No murmur heard. Pulmonary/Chest: Effort normal and breath sounds normal. No respiratory distress. She has no wheezes. She has no rales.  Abdominal: Soft. Bowel sounds are normal. She exhibits no distension and no mass. There is generalized tenderness. There is no rigidity, no rebound, no guarding, no CVA tenderness, no tenderness at McBurney's point and negative Murphy's sign. No hernia.  Lymphadenopathy:    She has no cervical adenopathy.  Neurological: She is alert and oriented to person, place, and time.  Skin: Skin is warm and dry. No rash noted. She is not diaphoretic. No erythema. No pallor.      Assessment & Plan:  1. Viral gastroenteritis Given hydration instructions. 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  - ondansetron (ZOFRAN) 4 MG tablet; Take 1 tablet (4 mg total) by mouth every 8 (eight) hours as needed for nausea or vomiting.  Dispense: 20 tablet; Refill: 0   Ziara Thelander PA-C  [facility] 03/20/2015 10:52 AM

## 2015-03-25 ENCOUNTER — Ambulatory Visit (HOSPITAL_BASED_OUTPATIENT_CLINIC_OR_DEPARTMENT_OTHER): Payer: BLUE CROSS/BLUE SHIELD | Admitting: Anesthesiology

## 2015-03-25 ENCOUNTER — Encounter (HOSPITAL_BASED_OUTPATIENT_CLINIC_OR_DEPARTMENT_OTHER)
Admission: RE | Disposition: A | Discharge status: Home / Self Care | Payer: Self-pay | Source: Ambulatory Visit | Attending: Orthopedic Surgery

## 2015-03-25 ENCOUNTER — Ambulatory Visit (HOSPITAL_BASED_OUTPATIENT_CLINIC_OR_DEPARTMENT_OTHER)
Admission: RE | Admit: 2015-03-25 | Discharge: 2015-03-25 | Disposition: A | Discharge status: Home / Self Care | Payer: BLUE CROSS/BLUE SHIELD | Source: Ambulatory Visit | Attending: Orthopedic Surgery | Admitting: Orthopedic Surgery

## 2015-03-25 ENCOUNTER — Encounter (HOSPITAL_BASED_OUTPATIENT_CLINIC_OR_DEPARTMENT_OTHER): Payer: Self-pay | Admitting: *Deleted

## 2015-03-25 DIAGNOSIS — D573 Sickle-cell trait: Secondary | ICD-10-CM | POA: Insufficient documentation

## 2015-03-25 DIAGNOSIS — M654 Radial styloid tenosynovitis [de Quervain]: Secondary | ICD-10-CM | POA: Diagnosis not present

## 2015-03-25 DIAGNOSIS — G5601 Carpal tunnel syndrome, right upper limb: Secondary | ICD-10-CM | POA: Diagnosis present

## 2015-03-25 DIAGNOSIS — G43909 Migraine, unspecified, not intractable, without status migrainosus: Secondary | ICD-10-CM | POA: Insufficient documentation

## 2015-03-25 DIAGNOSIS — J45909 Unspecified asthma, uncomplicated: Secondary | ICD-10-CM | POA: Insufficient documentation

## 2015-03-25 HISTORY — PX: CARPAL TUNNEL RELEASE: SHX101

## 2015-03-25 HISTORY — PX: DORSAL COMPARTMENT RELEASE: SHX5039

## 2015-03-25 LAB — POCT HEMOGLOBIN-HEMACUE: HEMOGLOBIN: 12.1 g/dL (ref 12.0–15.0)

## 2015-03-25 SURGERY — RELEASE, FIRST DORSAL COMPARTMENT, HAND
Anesthesia: General | Site: Wrist | Laterality: Right

## 2015-03-25 MED ORDER — OXYCODONE HCL 5 MG PO TABS
5.0000 mg | ORAL_TABLET | Freq: Once | ORAL | Status: AC | PRN
  Administered 2015-03-25: 5 mg via ORAL

## 2015-03-25 MED ORDER — FENTANYL CITRATE 0.05 MG/ML IJ SOLN
INTRAMUSCULAR | Status: DC | PRN
  Administered 2015-03-25: 100 ug via INTRAVENOUS

## 2015-03-25 MED ORDER — FENTANYL CITRATE 0.05 MG/ML IJ SOLN
INTRAMUSCULAR | Status: AC
  Filled 2015-03-25: qty 2

## 2015-03-25 MED ORDER — OXYCODONE HCL 5 MG PO TABS
ORAL_TABLET | ORAL | Status: AC
  Filled 2015-03-25: qty 1

## 2015-03-25 MED ORDER — CEFAZOLIN SODIUM-DEXTROSE 2-3 GM-% IV SOLR
2.0000 g | INTRAVENOUS | Status: AC
  Administered 2015-03-25: 2 g via INTRAVENOUS

## 2015-03-25 MED ORDER — ONDANSETRON HCL 4 MG/2ML IJ SOLN: 4.0000 mg | Freq: Once | INTRAMUSCULAR | Status: DC | PRN

## 2015-03-25 MED ORDER — BUPIVACAINE-EPINEPHRINE 0.5% -1:200000 IJ SOLN
INTRAMUSCULAR | Status: DC | PRN
  Administered 2015-03-25: 7 mL

## 2015-03-25 MED ORDER — MIDAZOLAM HCL 5 MG/5ML IJ SOLN
INTRAMUSCULAR | Status: DC | PRN
  Administered 2015-03-25: 2 mg via INTRAVENOUS

## 2015-03-25 MED ORDER — MIDAZOLAM HCL 2 MG/2ML IJ SOLN
INTRAMUSCULAR | Status: AC
  Filled 2015-03-25: qty 2

## 2015-03-25 MED ORDER — HYDROMORPHONE HCL 1 MG/ML IJ SOLN
INTRAMUSCULAR | Status: AC
  Filled 2015-03-25: qty 1

## 2015-03-25 MED ORDER — HYDROMORPHONE HCL 1 MG/ML IJ SOLN
0.2500 mg | INTRAMUSCULAR | Status: DC | PRN
  Administered 2015-03-25: 0.5 mg via INTRAVENOUS
  Administered 2015-03-25 (×2): 0.25 mg via INTRAVENOUS

## 2015-03-25 MED ORDER — LACTATED RINGERS IV SOLN: INTRAVENOUS | Status: DC

## 2015-03-25 MED ORDER — MIDAZOLAM HCL 2 MG/2ML IJ SOLN: 1.0000 mg | INTRAMUSCULAR | Status: DC | PRN

## 2015-03-25 MED ORDER — HYDROCODONE-ACETAMINOPHEN 5-325 MG PO TABS: 1.0000 | ORAL_TABLET | Freq: Four times a day (QID) | ORAL | Status: DC | PRN

## 2015-03-25 MED ORDER — CEFAZOLIN SODIUM-DEXTROSE 2-3 GM-% IV SOLR
INTRAVENOUS | Status: AC
  Filled 2015-03-25: qty 50

## 2015-03-25 MED ORDER — FENTANYL CITRATE 0.05 MG/ML IJ SOLN: 50.0000 ug | INTRAMUSCULAR | Status: DC | PRN

## 2015-03-25 MED ORDER — LIDOCAINE HCL (PF) 1 % IJ SOLN
INTRAMUSCULAR | Status: DC | PRN
  Administered 2015-03-25: 7 mL

## 2015-03-25 MED ORDER — LIDOCAINE HCL (CARDIAC) 20 MG/ML IV SOLN
INTRAVENOUS | Status: DC | PRN
  Administered 2015-03-25: 30 mg via INTRAVENOUS

## 2015-03-25 MED ORDER — ONDANSETRON HCL 4 MG/2ML IJ SOLN
INTRAMUSCULAR | Status: DC | PRN
  Administered 2015-03-25: 4 mg via INTRAVENOUS

## 2015-03-25 MED ORDER — MIDAZOLAM HCL 2 MG/ML PO SYRP: 12.0000 mg | ORAL_SOLUTION | Freq: Once | ORAL | Status: DC | PRN

## 2015-03-25 MED ORDER — LACTATED RINGERS IV SOLN
INTRAVENOUS | Status: DC
  Administered 2015-03-25: 09:00:00 via INTRAVENOUS

## 2015-03-25 MED ORDER — OXYCODONE HCL 5 MG/5ML PO SOLN: 5.0000 mg | Freq: Once | ORAL | Status: AC | PRN

## 2015-03-25 SURGICAL SUPPLY — 48 items
APPLICATOR COTTON TIP 6IN STRL (MISCELLANEOUS) ×6 IMPLANT
BENZOIN TINCTURE PRP APPL 2/3 (GAUZE/BANDAGES/DRESSINGS) ×3 IMPLANT
BLADE HOOK ENDO STRL (BLADE) ×3 IMPLANT
BLADE MINI RND TIP GREEN BEAV (BLADE) IMPLANT
BLADE SURG 15 STRL LF DISP TIS (BLADE) ×1 IMPLANT
BLADE SURG 15 STRL SS (BLADE) ×2
BLADE TRIANGLE EPF/EGR ENDO (BLADE) ×3 IMPLANT
BNDG COHESIVE 4X5 TAN STRL (GAUZE/BANDAGES/DRESSINGS) ×3 IMPLANT
BNDG ESMARK 4X9 LF (GAUZE/BANDAGES/DRESSINGS) ×3 IMPLANT
BNDG GAUZE ELAST 4 BULKY (GAUZE/BANDAGES/DRESSINGS) ×6 IMPLANT
CHLORAPREP W/TINT 26ML (MISCELLANEOUS) ×3 IMPLANT
CORDS BIPOLAR (ELECTRODE) IMPLANT
COVER BACK TABLE 60X90IN (DRAPES) ×3 IMPLANT
COVER MAYO STAND STRL (DRAPES) ×3 IMPLANT
CUFF TOURNIQUET SINGLE 18IN (TOURNIQUET CUFF) ×3 IMPLANT
DRAIN TLS ROUND 10FR (DRAIN) ×3 IMPLANT
DRAPE EXTREMITY T 121X128X90 (DRAPE) ×3 IMPLANT
DRAPE SURG 17X23 STRL (DRAPES) ×3 IMPLANT
DRSG EMULSION OIL 3X3 NADH (GAUZE/BANDAGES/DRESSINGS) ×3 IMPLANT
GLOVE BIO SURGEON STRL SZ7.5 (GLOVE) ×3 IMPLANT
GLOVE BIOGEL PI IND STRL 7.0 (GLOVE) ×2 IMPLANT
GLOVE BIOGEL PI IND STRL 8 (GLOVE) ×1 IMPLANT
GLOVE BIOGEL PI INDICATOR 7.0 (GLOVE) ×4
GLOVE BIOGEL PI INDICATOR 8 (GLOVE) ×2
GLOVE ECLIPSE 6.5 STRL STRAW (GLOVE) ×6 IMPLANT
GOWN STRL REUS W/ TWL LRG LVL3 (GOWN DISPOSABLE) ×2 IMPLANT
GOWN STRL REUS W/TWL LRG LVL3 (GOWN DISPOSABLE) ×4
GOWN STRL REUS W/TWL XL LVL3 (GOWN DISPOSABLE) ×3 IMPLANT
NDL SAFETY ECLIPSE 18X1.5 (NEEDLE) ×1 IMPLANT
NEEDLE HYPO 18GX1.5 SHARP (NEEDLE) ×2
NEEDLE HYPO 25X1 1.5 SAFETY (NEEDLE) ×3 IMPLANT
NS IRRIG 1000ML POUR BTL (IV SOLUTION) ×3 IMPLANT
PACK BASIN DAY SURGERY FS (CUSTOM PROCEDURE TRAY) ×3 IMPLANT
PADDING CAST ABS 4INX4YD NS (CAST SUPPLIES) ×2
PADDING CAST ABS COTTON 4X4 ST (CAST SUPPLIES) ×1 IMPLANT
SPLINT PLASTER CAST XFAST 3X15 (CAST SUPPLIES) IMPLANT
SPLINT PLASTER XTRA FASTSET 3X (CAST SUPPLIES)
SPONGE GAUZE 4X4 12PLY STER LF (GAUZE/BANDAGES/DRESSINGS) ×3 IMPLANT
STOCKINETTE 6  STRL (DRAPES) ×2
STOCKINETTE 6 STRL (DRAPES) ×1 IMPLANT
SUT VICRYL RAPIDE 4-0 (SUTURE) ×3 IMPLANT
SUT VICRYL RAPIDE 4/0 PS 2 (SUTURE) ×3 IMPLANT
SYR BULB 3OZ (MISCELLANEOUS) ×3 IMPLANT
SYRINGE 10CC LL (SYRINGE) ×6 IMPLANT
TAPE STRIPS DRAPE STRL (GAUZE/BANDAGES/DRESSINGS) ×3 IMPLANT
TOWEL OR 17X24 6PK STRL BLUE (TOWEL DISPOSABLE) ×6 IMPLANT
TOWEL OR NON WOVEN STRL DISP B (DISPOSABLE) IMPLANT
UNDERPAD 30X30 INCONTINENT (UNDERPADS AND DIAPERS) ×3 IMPLANT

## 2015-03-25 NOTE — Interval H&P Note (Signed)
History and Physical Interval Note:  03/25/2015 9:31 AM  Kimberly Guerra  has presented today for surgery, with the diagnosis of RIGHT CARPAL TUNNEL SYNDROME DEQUERVAIN TENOSYNOVITIS   The various methods of treatment have been discussed with the patient and family. After consideration of risks, benefits and other options for treatment, the patient has consented to  Procedure(s): RIGHT WRIST DEQUERVAIN RELEASE AND  (Right)  ENDOSCOPIC CARPAL TUNNEL RELEASE (Right) as a surgical intervention .  The patient's history has been reviewed, patient examined, no change in status, stable for surgery.  I have reviewed the patient's chart and labs.  Questions were answered to the patient's satisfaction.     [name]

## 2015-03-25 NOTE — Discharge Instructions (Signed)
°  Post Anesthesia Home Care Instructions  Activity: Get plenty of rest for the remainder of the day. A responsible adult should stay with you for 24 hours following the procedure.  For the next 24 hours, DO NOT: -Drive a car -Paediatric nurse -Drink alcoholic beverages -Take any medication unless instructed by your physician -Make any legal decisions or sign important papers.  Meals: Start with liquid foods such as gelatin or soup. Progress to regular foods as tolerated. Avoid greasy, spicy, heavy foods. If nausea and/or vomiting occur, drink only clear liquids until the nausea and/or vomiting subsides. Call your physician if vomiting continues.  Special Instructions/Symptoms: Your throat may feel dry or sore from the anesthesia or the breathing tube placed in your throat during surgery. If this causes discomfort, gargle with warm salt water. The discomfort should disappear within 24 hours.     Discharge Instructions   You have a light dressing on your hand.  You may begin gentle motion of your fingers and hand immediately, but you should not do any heavy lifting or gripping.  Elevate your hand to reduce pain & swelling of the digits.  Ice over the operative site may be helpful to reduce pain & swelling.  DO NOT USE HEAT. Pain medicine has been prescribed for you.  Use your medicine as needed over the first 48 hours, and then you can begin to taper your use. You may use Tylenol in place of your prescribed pain medication, but not IN ADDITION to it. Leave the dressing in place until the third day after your surgery and then remove it, leaving it open to air.  After the bandage has been removed you may shower, regularly washing the incision and letting the water run over it, but not submerging it (no swimming, soaking it in dishwater, etc.) After removing the bandage, begin your skin mobility and wrist motion exercises several times daily, but using your wrist splint otherwise during the  waking hours. You may drive a car when you are off of prescription pain medications and can safely control your vehicle with both hands. We will address whether therapy will be required or not when you return to the office. You may have already made your follow-up appointment when we completed your preop visit.  If not, please call our office today or the next business day to make your return appointment for 10-15 days after surgery.   Please call [phone] during normal business hours or [phone] after hours for any problems. Including the following:  - excessive redness of the incisions - drainage for more than 4 days - fever of more than 101.5 F  *Please note that pain medications will not be refilled after hours or on weekends.  WORK STATUS:  NO WORK WITH RIGHT HAND UNTIL AT LEAST THE FIRST POSTOP VISIT IN APPROXIMATELY 10-15 DAYS

## 2015-03-25 NOTE — Op Note (Signed)
03/25/2015  9:32 AM  PATIENT:  Kimberly Guerra  29 y.o. female  PRE-OPERATIVE DIAGNOSIS:  RIGHT CARPAL TUNNEL SYNDROME DEQUERVAIN TENOSYNOVITIS   POST-OPERATIVE DIAGNOSIS:  Same  PROCEDURE:  Procedure(s): RIGHT WRIST DEQUERVAIN RELEASE AND   ENDOSCOPIC CARPAL TUNNEL RELEASE  SURGEON:  Surgeon(s): Milly Jakob, MD  PHYSICIAN ASSISTANT: Morley Kos, OPA-C  ANESTHESIA:  Local / MAC  SPECIMENS:  None  DRAINS:   None  EBL:  less than 50 mL  PREOPERATIVE INDICATIONS:  Kimberly Guerra is a  29 y.o. female with a diagnosis of RIGHT CARPAL TUNNEL SYNDROME DEQUERVAIN TENOSYNOVITIS  who failed conservative measures and elected for surgical management.    The risks benefits and alternatives were discussed with the patient preoperatively including but not limited to the risks of infection, bleeding, nerve injury, cardiopulmonary complications, the need for revision surgery, among others, and the patient verbalized understanding and consented to proceed.  OPERATIVE IMPLANTS: None  OPERATIVE FINDINGS: Tight carpal tunnel, acceptably decompressed following release.  No subcutaneous compartment mineralization of the EPB, but thickened tenosynovium upon it before was debrided.  OPERATIVE PROCEDURE:  After receiving prophylactic antibiotics, the patient was escorted to the operative theatre and placed in a supine position.   A surgical "time-out" was performed during which the planned procedure, proposed operative site, and the correct patient identity were compared to the operative consent and agreement confirmed by the circulating nurse according to current facility policy.  Following application of a tourniquet to the operative extremity, the planned incisions were marked and anesthetized with a mixture of lidocaine & bupivicaine containing epinephrine.  The exposed skin was prepped with Chloraprep and draped in the usual sterile fashion.  The limb was exsanguinated with an Esmarch  bandage and the tourniquet inflated to approximately 198mmHg higher than systolic BP.   The endoscopic carpal tunnel release was performed first. The incisions were made sharply. Subcutaneous tissues were dissected with blunt and spreading dissection. At the proximal incision, the deep forearm fascia was split in line with the skin incision the distal edge grasped with a hemostat. At the mid palmar incision, the palmar fascia was split in line with the skin incision, revealing the underlying superficial palmar arch which was visualized with loupe assisted magnification. The synovial reflector was then introduced into the proximal incision and passed through the carpal canal, uses to reflect synovium from the deep surface of the transverse carpal ligament. It was removed and replaced with the slotted cannula and blunt obturator, passing from proximal to distal and exiting the distal wound superficial to the superficial palmar arch. The obturator was removed and the camera inserted. Visualization of the ligament was acceptable. The triangle shape blade was then inserted distally, advanced to the midportion of the ligament, and there used to create a perforation in the ligament. This instrument was removed and the hooked nstrument was inserted. It was placed into the perforation in the ligament and withdrawn distally, completing transection of the distal half of the ligament. The camera was then removed and placed into the distal end of the cannula. The hooked instrument was placed into the proximal end and advanced facility to be placed into the apex of the V. which had been formed to the distal hemi-transection of the ligament. It was withdrawn proximally, completing transection of the ligament. The adequacy of the release was judged with the scope and the instruments used as a probe. All of the endoscopic instruments are removed and the adequacy of release was again judged from the proximal  incision perspective  with direct loupe assisted visualization. In addition, the proximal forearm fascia was split for 2 inches proximal to the proximal incision under direct visualization using a sliding scissor technique.   Attention was then directed to the de Quervain's release were an oblique incision was made sharply over the first dorsal compartment. Skin flaps were elevated and full-thickness, identifying and retracting the superficial radial nerve and the dorsal flap. The retinaculum of the sheath itself was identified and split in a Z-plasty fashion, creating a distal palmar flap. This was done so that it could be later reapproximated and expanded position creating restraint for the tendons but with much improved space for them. The EPB tendon was found not to have a separate subcutaneous compartment, but there was some muscle upon the tendon that extended fairly distally, and this was debrided in addition to thickened tenosynovium in the region.  The flaps of the sheath were then allowed to expand, and the corners reapproximated with 4-0 Vicryl Rapide interrupted sutures 2.  All the wounds were then copiously irrigated, and there was easy egress of the irrigant out the palmar wound when introduced at the proximal wrist wound, confirming good release of the carpal tunnel.   A TLS drain was placed to lie alongside the nerve exiting its own skin hole distally made with some sharp trocar. The skin was then closed with 4-0 Vicryl Rapide sutures.  The radial wrist incision was a running subcuticular suture with benzoin and Steri-Strips, and the other incisions were interrupted.  A light dressing was applied and the balance of 10 mL of the initial anesthetic mixture was placed down the drain, and the drain was clamped to be unclamped in the recovery room.  DISPOSITION:  The patient will be discharged home today, returning in 10-15 days for re-assessment.

## 2015-03-25 NOTE — Transfer of Care (Signed)
Immediate Anesthesia Transfer of Care Note  Patient: Kimberly Guerra  Procedure(s) Performed: Procedure(s): RIGHT WRIST DEQUERVAIN RELEASE AND  (Right)  ENDOSCOPIC CARPAL TUNNEL RELEASE (Right)  Patient Location: PACU  Anesthesia Type:MAC  Level of Consciousness: awake and alert   Airway & Oxygen Therapy: Patient Spontanous Breathing and Patient connected to face mask oxygen  Post-op Assessment: Report given to RN and Post -op Vital signs reviewed and stable  Post vital signs: Reviewed and stable  Last Vitals:  Filed Vitals:   03/25/15 0851  BP: 128/75  Pulse: 78  Temp: 36.9 C  Resp: 18    Complications: No apparent anesthesia complications

## 2015-03-25 NOTE — Anesthesia Postprocedure Evaluation (Signed)
  Anesthesia Post-op Note  Patient: Kimberly Guerra  Procedure(s) Performed: Procedure(s): RIGHT WRIST DEQUERVAIN RELEASE AND  (Right)  ENDOSCOPIC CARPAL TUNNEL RELEASE (Right)  Patient Location: PACU  Anesthesia Type: MAC   Level of Consciousness: awake, alert  and oriented  Airway and Oxygen Therapy: Patient Spontanous Breathing  Post-op Pain: mild  Post-op Assessment: Post-op Vital signs reviewed  Post-op Vital Signs: Reviewed  Last Vitals:  Filed Vitals:   03/25/15 1200  BP: 129/90  Pulse: 87  Temp:   Resp: 18    Complications: No apparent anesthesia complications

## 2015-03-25 NOTE — Anesthesia Preprocedure Evaluation (Signed)
Anesthesia Evaluation  Patient identified by MRN, date of birth, ID band Patient awake    Reviewed: Allergy & Precautions, NPO status , Patient's Chart, lab work & pertinent test results  Airway Mallampati: I  TM Distance: >3 FB Neck ROM: Full    Dental  (+) Teeth Intact, Dental Advisory Given   Pulmonary asthma ,  breath sounds clear to auscultation        Cardiovascular Rhythm:Regular Rate:Normal     Neuro/Psych    GI/Hepatic   Endo/Other    Renal/GU      Musculoskeletal   Abdominal   Peds  Hematology   Anesthesia Other Findings   Reproductive/Obstetrics                             Anesthesia Physical Anesthesia Plan  ASA: II  Anesthesia Plan: General   Post-op Pain Management:    Induction: Intravenous  Airway Management Planned: Simple Face Mask  Additional Equipment:   Intra-op Plan:   Post-operative Plan:   Informed Consent: I have reviewed the patients History and Physical, chart, labs and discussed the procedure including the risks, benefits and alternatives for the proposed anesthesia with the patient or authorized representative who has indicated his/her understanding and acceptance.   Dental advisory given  Plan Discussed with: CRNA, Anesthesiologist and Surgeon  Anesthesia Plan Comments:         Anesthesia Quick Evaluation

## 2015-03-27 ENCOUNTER — Encounter (HOSPITAL_BASED_OUTPATIENT_CLINIC_OR_DEPARTMENT_OTHER): Payer: Self-pay | Admitting: Orthopedic Surgery

## 2015-03-27 NOTE — Addendum Note (Signed)
Addendum  created 03/27/15 2800 by Ernesta Amble Kierrah Kilbride, CRNA   Modules edited: Charges VN

## 2015-03-29 ENCOUNTER — Encounter (HOSPITAL_BASED_OUTPATIENT_CLINIC_OR_DEPARTMENT_OTHER): Payer: Self-pay | Admitting: Orthopedic Surgery

## 2015-05-31 ENCOUNTER — Ambulatory Visit (INDEPENDENT_AMBULATORY_CARE_PROVIDER_SITE_OTHER): Payer: BLUE CROSS/BLUE SHIELD | Admitting: Physician Assistant

## 2015-05-31 ENCOUNTER — Encounter: Payer: Self-pay | Admitting: Physician Assistant

## 2015-05-31 VITALS — BP 120/84 | HR 89 | Temp 98.2°F | Ht <= 58 in | Wt 148.2 lb

## 2015-05-31 DIAGNOSIS — G43909 Migraine, unspecified, not intractable, without status migrainosus: Secondary | ICD-10-CM

## 2015-05-31 MED ORDER — METOPROLOL TARTRATE 25 MG PO TABS: 25.0000 mg | ORAL_TABLET | Freq: Two times a day (BID) | ORAL | Status: DC

## 2015-05-31 MED ORDER — ELETRIPTAN HYDROBROMIDE 20 MG PO TABS: 20.0000 mg | ORAL_TABLET | ORAL | Status: DC | PRN

## 2015-05-31 NOTE — Progress Notes (Signed)
Patient presents to clinic today to establish care.  Patient with long-standing history of migraines, with recent recurrence over the past month.  Denies change to diet or regimen.  Is having 15+ headaches per month associated with nausea and photophobia.  Was previously on Topamax and Elavil for prophylaxis but could not tolerate either due to excessive sleepiness and suicidal ideation (Topamax only). Is taking tylenol for headaches with mild relief. Denies vision changes, AMS with headaches.  Past Medical History  Diagnosis Date  . Sickle cell trait   . Migraines   . Asthma     prn inhaler  . Carpal tunnel syndrome of right wrist 02/2015  . De Quervain's tenosynovitis, right 02/2015    Past Surgical History  Procedure Laterality Date  . Cesarean section  01/02/2009  . Dorsal compartment release Right 03/25/2015    Procedure: RIGHT WRIST DEQUERVAIN RELEASE AND ;  Surgeon: Milly Jakob, MD;  Location: [place];  Service: Orthopedics;  Laterality: Right;  . Carpal tunnel release Right 03/25/2015    Procedure:  ENDOSCOPIC CARPAL TUNNEL RELEASE;  Surgeon: Milly Jakob, MD;  Location: [place];  Service: Orthopedics;  Laterality: Right;    Current Outpatient Prescriptions on File Prior to Visit  Medication Sig Dispense Refill  . albuterol (PROVENTIL HFA;VENTOLIN HFA) 108 (90 BASE) MCG/ACT inhaler Inhale 2 puffs into the lungs every 6 (six) hours as needed. For wheeze or shortness of breath 1 Inhaler 5  . ondansetron (ZOFRAN) 4 MG tablet Take 1 tablet (4 mg total) by mouth every 8 (eight) hours as needed for nausea or vomiting. 20 tablet 0   No current facility-administered medications on file prior to visit.    No Known Allergies  Family History  Problem Relation Age of Onset  . Hypertension Mother   . Asthma Father   . Hypertension Father   . Asthma Brother   . Asthma Paternal Aunt   . Diabetes Paternal Aunt   . Cancer Maternal Grandfather     oral cancer  . Diabetes Maternal Grandfather   . Hypertension Maternal Grandfather     History   Social History  . Marital Status: Single    Spouse Name: N/A  . Number of Children: 1  . Years of Education: 12   Occupational History  . sales    Social History Main Topics  . Smoking status: Never Smoker   . Smokeless tobacco: Never Used  . Alcohol Use: Yes     Comment: occasionally  . Drug Use: No  . Sexual Activity: Yes    Birth Control/ Protection: None   Other Topics Concern  . Not on file   Social History Narrative   Lives with son (2010), employeed at [employer] part-time. Completed some college classes.    Review of Systems  HENT: Negative for ear pain, hearing loss and tinnitus.   Eyes: Negative for blurred vision and double vision.  Neurological: Positive for headaches. Negative for dizziness and loss of consciousness.  Psychiatric/Behavioral: Negative for depression, suicidal ideas, hallucinations and substance abuse. The patient is not nervous/anxious and does not have insomnia.     BP 120/84 mmHg  Pulse 89  Temp(Src) 98.2 F (36.8 C) (Oral)  Ht 4' 9.5" (1.461 m)  Wt 148 lb 3.2 oz (67.223 kg)  BMI 31.49 kg/m2  SpO2 100%  LMP 05/08/2015  Physical Exam  Constitutional: She is oriented to person, place, and time and well-developed, well-nourished, and in no distress.  HENT:  Head:  Normocephalic and atraumatic.  Eyes: Conjunctivae and EOM are normal. Pupils are equal, round, and reactive to light.  Neck: Neck supple. No thyromegaly present.  Cardiovascular: Normal rate, regular rhythm, normal heart sounds and intact distal pulses.   Pulmonary/Chest: Effort normal and breath sounds normal. No respiratory distress. She has no wheezes. She has no rales. She exhibits no tenderness.  Lymphadenopathy:    She has no cervical adenopathy.  Neurological: She is alert and oriented to person, place, and time.  Skin: Skin is warm and dry. No rash noted.  Psychiatric:  Affect normal.  Vitals reviewed.   Recent Results (from the past 2160 hour(s))  Hemoglobin-hemacue, POC     Status: None   Collection Time: 03/25/15  9:07 AM  Result Value Ref Range   Hemoglobin 12.1 12.0 - 15.0 g/dL    Assessment/Plan: Migraines Will begin Metoprolol 25 mg BID for migraine prophylaxis.  Rx Relpax for abortive therapy for severe headache. Excedrin Migraine for milder headache.  Dietary and supportive measures reviewed.  Follow-up in 3-4 weeks for reassessment, BP check and CPE with PAP.

## 2015-05-31 NOTE — Patient Instructions (Signed)
Please take the Metoprolol twice daily as directed to prevent migraines. I will try to get the Relpax approved by your insurance.  Follow-up in 3 weeks for reassessment and a physical

## 2015-05-31 NOTE — Progress Notes (Signed)
Pre visit review using our clinic review tool, if applicable. No additional management support is needed unless otherwise documented below in the visit note. 

## 2015-06-02 NOTE — Assessment & Plan Note (Signed)
Will begin Metoprolol 25 mg BID for migraine prophylaxis.  Rx Relpax for abortive therapy for severe headache. Excedrin Migraine for milder headache.  Dietary and supportive measures reviewed.  Follow-up in 3-4 weeks for reassessment, BP check and CPE with PAP.

## 2015-06-03 ENCOUNTER — Telehealth: Payer: Self-pay | Admitting: Physician Assistant

## 2015-06-03 NOTE — Telephone Encounter (Signed)
Pre Visit letter sent  °

## 2015-06-20 ENCOUNTER — Telehealth: Payer: Self-pay

## 2015-06-20 NOTE — Telephone Encounter (Signed)
See speciality notes 

## 2015-06-21 ENCOUNTER — Ambulatory Visit (INDEPENDENT_AMBULATORY_CARE_PROVIDER_SITE_OTHER): Payer: BLUE CROSS/BLUE SHIELD | Admitting: Physician Assistant

## 2015-06-21 ENCOUNTER — Telehealth: Payer: Self-pay | Admitting: Physician Assistant

## 2015-06-21 ENCOUNTER — Encounter: Payer: Self-pay | Admitting: Physician Assistant

## 2015-06-21 ENCOUNTER — Other Ambulatory Visit: Payer: BLUE CROSS/BLUE SHIELD

## 2015-06-21 ENCOUNTER — Other Ambulatory Visit (HOSPITAL_COMMUNITY)
Admission: RE | Admit: 2015-06-21 | Discharge: 2015-06-21 | Disposition: A | Discharge status: Home / Self Care | Payer: BLUE CROSS/BLUE SHIELD | Source: Ambulatory Visit | Attending: Physician Assistant | Admitting: Physician Assistant

## 2015-06-21 VITALS — BP 115/72 | HR 74 | Temp 98.4°F | Ht <= 58 in | Wt 147.6 lb

## 2015-06-21 DIAGNOSIS — Z0000 Encounter for general adult medical examination without abnormal findings: Secondary | ICD-10-CM | POA: Diagnosis not present

## 2015-06-21 DIAGNOSIS — Z113 Encounter for screening for infections with a predominantly sexual mode of transmission: Secondary | ICD-10-CM | POA: Diagnosis present

## 2015-06-21 DIAGNOSIS — G43909 Migraine, unspecified, not intractable, without status migrainosus: Secondary | ICD-10-CM

## 2015-06-21 DIAGNOSIS — Z124 Encounter for screening for malignant neoplasm of cervix: Secondary | ICD-10-CM

## 2015-06-21 DIAGNOSIS — N3000 Acute cystitis without hematuria: Secondary | ICD-10-CM

## 2015-06-21 DIAGNOSIS — Z01419 Encounter for gynecological examination (general) (routine) without abnormal findings: Secondary | ICD-10-CM | POA: Diagnosis present

## 2015-06-21 DIAGNOSIS — J309 Allergic rhinitis, unspecified: Secondary | ICD-10-CM | POA: Diagnosis not present

## 2015-06-21 DIAGNOSIS — N760 Acute vaginitis: Secondary | ICD-10-CM | POA: Insufficient documentation

## 2015-06-21 LAB — URINALYSIS, ROUTINE W REFLEX MICROSCOPIC
BILIRUBIN URINE: NEGATIVE
KETONES UR: NEGATIVE
Leukocytes, UA: NEGATIVE
NITRITE: POSITIVE — AB
SPECIFIC GRAVITY, URINE: 1.02 (ref 1.000–1.030)
Total Protein, Urine: NEGATIVE
URINE GLUCOSE: NEGATIVE
Urobilinogen, UA: 0.2 (ref 0.0–1.0)
pH: 5 (ref 5.0–8.0)

## 2015-06-21 LAB — BASIC METABOLIC PANEL
BUN: 10 mg/dL (ref 6–23)
CO2: 26 mEq/L (ref 19–32)
Calcium: 9.6 mg/dL (ref 8.4–10.5)
Chloride: 104 mEq/L (ref 96–112)
Creatinine, Ser: 0.62 mg/dL (ref 0.40–1.20)
GFR: 146.98 mL/min (ref >60.00)
Glucose, Bld: 84 mg/dL (ref 70–99)
Potassium: 4.1 mEq/L (ref 3.5–5.1)
SODIUM: 136 meq/L (ref 135–145)

## 2015-06-21 LAB — LIPID PANEL
CHOLESTEROL: 158 mg/dL (ref 0–200)
HDL: 59 mg/dL (ref >39.00)
LDL Cholesterol: 91 mg/dL (ref 0–99)
NONHDL: 99
Total CHOL/HDL Ratio: 3
Triglycerides: 42 mg/dL (ref 0.0–149.0)
VLDL: 8.4 mg/dL (ref 0.0–40.0)

## 2015-06-21 LAB — CBC
HCT: 38.2 % (ref 36.0–46.0)
Hemoglobin: 12.5 g/dL (ref 12.0–15.0)
MCHC: 32.8 g/dL (ref 30.0–36.0)
MCV: 78.5 fl (ref 78.0–100.0)
PLATELETS: 413 10*3/uL — AB (ref 150.0–400.0)
RBC: 4.87 Mil/uL (ref 3.87–5.11)
RDW: 15.9 % — AB (ref 11.5–15.5)
WBC: 6.6 10*3/uL (ref 4.0–10.5)

## 2015-06-21 LAB — HEPATIC FUNCTION PANEL
ALK PHOS: 95 U/L (ref 39–117)
ALT: 14 U/L (ref 0–35)
AST: 17 U/L (ref 0–37)
Albumin: 4.3 g/dL (ref 3.5–5.2)
BILIRUBIN DIRECT: 0.1 mg/dL (ref 0.0–0.3)
Total Bilirubin: 0.3 mg/dL (ref 0.2–1.2)
Total Protein: 8 g/dL (ref 6.0–8.3)

## 2015-06-21 LAB — TSH: TSH: 0.32 u[IU]/mL — ABNORMAL LOW (ref 0.35–4.50)

## 2015-06-21 MED ORDER — CIPROFLOXACIN HCL 250 MG PO TABS: 250.0000 mg | ORAL_TABLET | Freq: Two times a day (BID) | ORAL | Status: DC

## 2015-06-21 MED ORDER — METOPROLOL TARTRATE 25 MG PO TABS: 25.0000 mg | ORAL_TABLET | Freq: Two times a day (BID) | ORAL | Status: DC

## 2015-06-21 MED ORDER — ALBUTEROL SULFATE HFA 108 (90 BASE) MCG/ACT IN AERS: 2.0000 | INHALATION_SPRAY | Freq: Four times a day (QID) | RESPIRATORY_TRACT | Status: DC | PRN

## 2015-06-21 MED ORDER — ELETRIPTAN HYDROBROMIDE 20 MG PO TABS: 20.0000 mg | ORAL_TABLET | ORAL | Status: DC | PRN

## 2015-06-21 MED ORDER — FLUTICASONE PROPIONATE 50 MCG/ACT NA SUSP: 2.0000 | Freq: Every day | NASAL | Status: DC

## 2015-06-21 NOTE — Assessment & Plan Note (Signed)
Depression scree negative.  Health Maintenance reviewed.  Patient will check home records for Tetanus vaccination.  Will obtain fasting labs today. Preventive schedule discussed and handout given in AVS.

## 2015-06-21 NOTE — Addendum Note (Signed)
Addended by: Modena Morrow D on: 06/21/2015 04:44 PM   Modules accepted: Orders

## 2015-06-21 NOTE — Assessment & Plan Note (Signed)
Exam with thin white discharge.  PAP obtained.  Will send Wet prep.  Will treat based on results.

## 2015-06-21 NOTE — Telephone Encounter (Signed)
Called and spoke with the pt and informed her of the note below.  Pt verbalized understanding and agreed.//AB/CMA 

## 2015-06-21 NOTE — Progress Notes (Signed)
Pre visit review using our clinic review tool, if applicable. No additional management support is needed unless otherwise documented below in the visit note. 

## 2015-06-21 NOTE — Patient Instructions (Signed)
Please go to the lab for blood work. I will call you with your results.  Please continue medications as directed.  Start the Flonase daily as directed for allergy symptoms. Aleve daily for pelvic discomfort.  Likely due to cyst on ovaries.  If not resolving please call me so we can order and ultrasound.  Preventive Care for Adults A healthy lifestyle and preventive care can promote health and wellness. Preventive health guidelines for women include the following key practices.  A routine yearly physical is a good way to check with your health care provider about your health and preventive screening. It is a chance to share any concerns and updates on your health and to receive a thorough exam.  Visit your dentist for a routine exam and preventive care every 6 months. Brush your teeth twice a day and floss once a day. Good oral hygiene prevents tooth decay and gum disease.  The frequency of eye exams is based on your age, health, family medical history, use of contact lenses, and other factors. Follow your health care provider's recommendations for frequency of eye exams.  Eat a healthy diet. Foods like vegetables, fruits, whole grains, low-fat dairy products, and lean protein foods contain the nutrients you need without too many calories. Decrease your intake of foods high in solid fats, added sugars, and salt. Eat the right amount of calories for you.Get information about a proper diet from your health care provider, if necessary.  Regular physical exercise is one of the most important things you can do for your health. Most adults should get at least 150 minutes of moderate-intensity exercise (any activity that increases your heart rate and causes you to sweat) each week. In addition, most adults need muscle-strengthening exercises on 2 or more days a week.  Maintain a healthy weight. The body mass index (BMI) is a screening tool to identify possible weight problems. It provides an estimate of  body fat based on height and weight. Your health care provider can find your BMI and can help you achieve or maintain a healthy weight.For adults 20 years and older:  A BMI below 18.5 is considered underweight.  A BMI of 18.5 to 24.9 is normal.  A BMI of 25 to 29.9 is considered overweight.  A BMI of 30 and above is considered obese.  Maintain normal blood lipids and cholesterol levels by exercising and minimizing your intake of saturated fat. Eat a balanced diet with plenty of fruit and vegetables. Blood tests for lipids and cholesterol should begin at age 32 and be repeated every 5 years. If your lipid or cholesterol levels are high, you are over 50, or you are at high risk for heart disease, you may need your cholesterol levels checked more frequently.Ongoing high lipid and cholesterol levels should be treated with medicines if diet and exercise are not working.  If you smoke, find out from your health care provider how to quit. If you do not use tobacco, do not start.  Lung cancer screening is recommended for adults aged 9-80 years who are at high risk for developing lung cancer because of a history of smoking. A yearly low-dose CT scan of the lungs is recommended for people who have at least a 30-pack-year history of smoking and are a current smoker or have quit within the past 15 years. A pack year of smoking is smoking an average of 1 pack of cigarettes a day for 1 year (for example: 1 pack a day for 30 years  or 2 packs a day for 15 years). Yearly screening should continue until the smoker has stopped smoking for at least 15 years. Yearly screening should be stopped for people who develop a health problem that would prevent them from having lung cancer treatment.  If you are pregnant, do not drink alcohol. If you are breastfeeding, be very cautious about drinking alcohol. If you are not pregnant and choose to drink alcohol, do not have more than 1 drink per day. One drink is considered to  be 12 ounces (355 mL) of beer, 5 ounces (148 mL) of wine, or 1.5 ounces (44 mL) of liquor.  Avoid use of street drugs. Do not share needles with anyone. Ask for help if you need support or instructions about stopping the use of drugs.  High blood pressure causes heart disease and increases the risk of stroke. Your blood pressure should be checked at least every 1 to 2 years. Ongoing high blood pressure should be treated with medicines if weight loss and exercise do not work.  If you are 34-59 years old, ask your health care provider if you should take aspirin to prevent strokes.  Diabetes screening involves taking a blood sample to check your fasting blood sugar level. This should be done once every 3 years, after age 35, if you are within normal weight and without risk factors for diabetes. Testing should be considered at a younger age or be carried out more frequently if you are overweight and have at least 1 risk factor for diabetes.  Breast cancer screening is essential preventive care for women. You should practice "breast self-awareness." This means understanding the normal appearance and feel of your breasts and may include breast self-examination. Any changes detected, no matter how small, should be reported to a health care provider. Women in their 73s and 30s should have a clinical breast exam (CBE) by a health care provider as part of a regular health exam every 1 to 3 years. After age 10, women should have a CBE every year. Starting at age 29, women should consider having a mammogram (breast X-ray test) every year. Women who have a family history of breast cancer should talk to their health care provider about genetic screening. Women at a high risk of breast cancer should talk to their health care providers about having an MRI and a mammogram every year.  Breast cancer gene (BRCA)-related cancer risk assessment is recommended for women who have family members with BRCA-related cancers.  BRCA-related cancers include breast, ovarian, tubal, and peritoneal cancers. Having family members with these cancers may be associated with an increased risk for harmful changes (mutations) in the breast cancer genes BRCA1 and BRCA2. Results of the assessment will determine the need for genetic counseling and BRCA1 and BRCA2 testing.  Routine pelvic exams to screen for cancer are no longer recommended for nonpregnant women who are considered low risk for cancer of the pelvic organs (ovaries, uterus, and vagina) and who do not have symptoms. Ask your health care provider if a screening pelvic exam is right for you.  If you have had past treatment for cervical cancer or a condition that could lead to cancer, you need Pap tests and screening for cancer for at least 20 years after your treatment. If Pap tests have been discontinued, your risk factors (such as having a new sexual partner) need to be reassessed to determine if screening should be resumed. Some women have medical problems that increase the chance of getting cervical  cancer. In these cases, your health care provider may recommend more frequent screening and Pap tests.  The HPV test is an additional test that may be used for cervical cancer screening. The HPV test looks for the virus that can cause the cell changes on the cervix. The cells collected during the Pap test can be tested for HPV. The HPV test could be used to screen women aged 17 years and older, and should be used in women of any age who have unclear Pap test results. After the age of 42, women should have HPV testing at the same frequency as a Pap test.  Colorectal cancer can be detected and often prevented. Most routine colorectal cancer screening begins at the age of 64 years and continues through age 83 years. However, your health care provider may recommend screening at an earlier age if you have risk factors for colon cancer. On a yearly basis, your health care provider may  provide home test kits to check for hidden blood in the stool. Use of a small camera at the end of a tube, to directly examine the colon (sigmoidoscopy or colonoscopy), can detect the earliest forms of colorectal cancer. Talk to your health care provider about this at age 57, when routine screening begins. Direct exam of the colon should be repeated every 5-10 years through age 108 years, unless early forms of pre-cancerous polyps or small growths are found.  People who are at an increased risk for hepatitis B should be screened for this virus. You are considered at high risk for hepatitis B if:  You were born in a country where hepatitis B occurs often. Talk with your health care provider about which countries are considered high risk.  Your parents were born in a high-risk country and you have not received a shot to protect against hepatitis B (hepatitis B vaccine).  You have HIV or AIDS.  You use needles to inject street drugs.  You live with, or have sex with, someone who has hepatitis B.  You get hemodialysis treatment.  You take certain medicines for conditions like cancer, organ transplantation, and autoimmune conditions.  Hepatitis C blood testing is recommended for all people born from [birth year] through 1964 and any individual with known risks for hepatitis C.  Practice safe sex. Use condoms and avoid high-risk sexual practices to reduce the spread of sexually transmitted infections (STIs). STIs include gonorrhea, chlamydia, syphilis, trichomonas, herpes, HPV, and human immunodeficiency virus (HIV). Herpes, HIV, and HPV are viral illnesses that have no cure. They can result in disability, cancer, and death.  You should be screened for sexually transmitted illnesses (STIs) including gonorrhea and chlamydia if:  You are sexually active and are younger than 24 years.  You are older than 24 years and your health care provider tells you that you are at risk for this type of  infection.  Your sexual activity has changed since you were last screened and you are at an increased risk for chlamydia or gonorrhea. Ask your health care provider if you are at risk.  If you are at risk of being infected with HIV, it is recommended that you take a prescription medicine daily to prevent HIV infection. This is called preexposure prophylaxis (PrEP). You are considered at risk if:  You are a heterosexual woman, are sexually active, and are at increased risk for HIV infection.  You take drugs by injection.  You are sexually active with a partner who has HIV.  Talk with your  health care provider about whether you are at high risk of being infected with HIV. If you choose to begin PrEP, you should first be tested for HIV. You should then be tested every 3 months for as long as you are taking PrEP.  Osteoporosis is a disease in which the bones lose minerals and strength with aging. This can result in serious bone fractures or breaks. The risk of osteoporosis can be identified using a bone density scan. Women ages 97 years and over and women at risk for fractures or osteoporosis should discuss screening with their health care providers. Ask your health care provider whether you should take a calcium supplement or vitamin D to reduce the rate of osteoporosis.  Menopause can be associated with physical symptoms and risks. Hormone replacement therapy is available to decrease symptoms and risks. You should talk to your health care provider about whether hormone replacement therapy is right for you.  Use sunscreen. Apply sunscreen liberally and repeatedly throughout the day. You should seek shade when your shadow is shorter than you. Protect yourself by wearing long sleeves, pants, a wide-brimmed hat, and sunglasses year round, whenever you are outdoors.  Once a month, do a whole body skin exam, using a mirror to look at the skin on your back. Tell your health care provider of new moles,  moles that have irregular borders, moles that are larger than a pencil eraser, or moles that have changed in shape or color.  Stay current with required vaccines (immunizations).  Influenza vaccine. All adults should be immunized every year.  Tetanus, diphtheria, and acellular pertussis (Td, Tdap) vaccine. Pregnant women should receive 1 dose of Tdap vaccine during each pregnancy. The dose should be obtained regardless of the length of time since the last dose. Immunization is preferred during the 27th-36th week of gestation. An adult who has not previously received Tdap or who does not know her vaccine status should receive 1 dose of Tdap. This initial dose should be followed by tetanus and diphtheria toxoids (Td) booster doses every 10 years. Adults with an unknown or incomplete history of completing a 3-dose immunization series with Td-containing vaccines should begin or complete a primary immunization series including a Tdap dose. Adults should receive a Td booster every 10 years.  Varicella vaccine. An adult without evidence of immunity to varicella should receive 2 doses or a second dose if she has previously received 1 dose. Pregnant females who do not have evidence of immunity should receive the first dose after pregnancy. This first dose should be obtained before leaving the health care facility. The second dose should be obtained 4-8 weeks after the first dose.  Human papillomavirus (HPV) vaccine. Females aged 13-26 years who have not received the vaccine previously should obtain the 3-dose series. The vaccine is not recommended for use in pregnant females. However, pregnancy testing is not needed before receiving a dose. If a female is found to be pregnant after receiving a dose, no treatment is needed. In that case, the remaining doses should be delayed until after the pregnancy. Immunization is recommended for any person with an immunocompromised condition through the age of 37 years if she  did not get any or all doses earlier. During the 3-dose series, the second dose should be obtained 4-8 weeks after the first dose. The third dose should be obtained 24 weeks after the first dose and 16 weeks after the second dose.  Zoster vaccine. One dose is recommended for adults aged 63 years  or older unless certain conditions are present.  Measles, mumps, and rubella (MMR) vaccine. Adults born before [birth year] generally are considered immune to measles and mumps. Adults born in [birth year] or later should have 1 or more doses of MMR vaccine unless there is a contraindication to the vaccine or there is laboratory evidence of immunity to each of the three diseases. A routine second dose of MMR vaccine should be obtained at least 28 days after the first dose for students attending postsecondary schools, health care workers, or international travelers. People who received inactivated measles vaccine or an unknown type of measles vaccine during 1963-1967 should receive 2 doses of MMR vaccine. People who received inactivated mumps vaccine or an unknown type of mumps vaccine before 1979 and are at high risk for mumps infection should consider immunization with 2 doses of MMR vaccine. For females of childbearing age, rubella immunity should be determined. If there is no evidence of immunity, females who are not pregnant should be vaccinated. If there is no evidence of immunity, females who are pregnant should delay immunization until after pregnancy. Unvaccinated health care workers born before [birth year] who lack laboratory evidence of measles, mumps, or rubella immunity or laboratory confirmation of disease should consider measles and mumps immunization with 2 doses of MMR vaccine or rubella immunization with 1 dose of MMR vaccine.  Pneumococcal 13-valent conjugate (PCV13) vaccine. When indicated, a person who is uncertain of her immunization history and has no record of immunization should receive the PCV13 vaccine. An adult  aged 30 years or older who has certain medical conditions and has not been previously immunized should receive 1 dose of PCV13 vaccine. This PCV13 should be followed with a dose of pneumococcal polysaccharide (PPSV23) vaccine. The PPSV23 vaccine dose should be obtained at least 8 weeks after the dose of PCV13 vaccine. An adult aged 60 years or older who has certain medical conditions and previously received 1 or more doses of PPSV23 vaccine should receive 1 dose of PCV13. The PCV13 vaccine dose should be obtained 1 or more years after the last PPSV23 vaccine dose.  Pneumococcal polysaccharide (PPSV23) vaccine. When PCV13 is also indicated, PCV13 should be obtained first. All adults aged 2 years and older should be immunized. An adult younger than age 72 years who has certain medical conditions should be immunized. Any person who resides in a nursing home or long-term care facility should be immunized. An adult smoker should be immunized. People with an immunocompromised condition and certain other conditions should receive both PCV13 and PPSV23 vaccines. People with human immunodeficiency virus (HIV) infection should be immunized as soon as possible after diagnosis. Immunization during chemotherapy or radiation therapy should be avoided. Routine use of PPSV23 vaccine is not recommended for American Indians, [place] Natives, or people younger than 65 years unless there are medical conditions that require PPSV23 vaccine. When indicated, people who have unknown immunization and have no record of immunization should receive PPSV23 vaccine. One-time revaccination 5 years after the first dose of PPSV23 is recommended for people aged 19-64 years who have chronic kidney failure, nephrotic syndrome, asplenia, or immunocompromised conditions. People who received 1-2 doses of PPSV23 before age 29 years should receive another dose of PPSV23 vaccine at age 57 years or later if at least 5 years have passed since the previous  dose. Doses of PPSV23 are not needed for people immunized with PPSV23 at or after age 42 years.  Meningococcal vaccine. Adults with asplenia or persistent complement component deficiencies should receive  2 doses of quadrivalent meningococcal conjugate (MenACWY-D) vaccine. The doses should be obtained at least 2 months apart. Microbiologists working with certain meningococcal bacteria, [employer] recruits, people at risk during an outbreak, and people who travel to or live in countries with a high rate of meningitis should be immunized. A first-year college student up through age 41 years who is living in a residence hall should receive a dose if she did not receive a dose on or after her 16th birthday. Adults who have certain high-risk conditions should receive one or more doses of vaccine.  Hepatitis A vaccine. Adults who wish to be protected from this disease, have certain high-risk conditions, work with hepatitis A-infected animals, work in hepatitis A research labs, or travel to or work in countries with a high rate of hepatitis A should be immunized. Adults who were previously unvaccinated and who anticipate close contact with an international adoptee during the first 60 days after arrival in the [place] from a country with a high rate of hepatitis A should be immunized.  Hepatitis B vaccine. Adults who wish to be protected from this disease, have certain high-risk conditions, may be exposed to blood or other infectious body fluids, are household contacts or sex partners of hepatitis B positive people, are clients or workers in certain care facilities, or travel to or work in countries with a high rate of hepatitis B should be immunized.  Haemophilus influenzae type b (Hib) vaccine. A previously unvaccinated person with asplenia or sickle cell disease or having a scheduled splenectomy should receive 1 dose of Hib vaccine. Regardless of previous immunization, a recipient of a hematopoietic stem cell  transplant should receive a 3-dose series 6-12 months after her successful transplant. Hib vaccine is not recommended for adults with HIV infection. Preventive Services / Frequency Ages 57 to 63 years  Blood pressure check.** / Every 1 to 2 years.  Lipid and cholesterol check.** / Every 5 years beginning at age 6.  Clinical breast exam.** / Every 3 years for women in their 40s and 4s.  BRCA-related cancer risk assessment.** / For women who have family members with a BRCA-related cancer (breast, ovarian, tubal, or peritoneal cancers).  Pap test.** / Every 2 years from ages 68 through 14. Every 3 years starting at age 77 through age 41 or 58 with a history of 3 consecutive normal Pap tests.  HPV screening.** / Every 3 years from ages 68 through ages 56 to 31 with a history of 3 consecutive normal Pap tests.  Hepatitis C blood test.** / For any individual with known risks for hepatitis C.  Skin self-exam. / Monthly.  Influenza vaccine. / Every year.  Tetanus, diphtheria, and acellular pertussis (Tdap, Td) vaccine.** / Consult your health care provider. Pregnant women should receive 1 dose of Tdap vaccine during each pregnancy. 1 dose of Td every 10 years.  Varicella vaccine.** / Consult your health care provider. Pregnant females who do not have evidence of immunity should receive the first dose after pregnancy.  HPV vaccine. / 3 doses over 6 months, if 31 and younger. The vaccine is not recommended for use in pregnant females. However, pregnancy testing is not needed before receiving a dose.  Measles, mumps, rubella (MMR) vaccine.** / You need at least 1 dose of MMR if you were born in 1957 or later. You may also need a 2nd dose. For females of childbearing age, rubella immunity should be determined. If there is no evidence of immunity, females who are  not pregnant should be vaccinated. If there is no evidence of immunity, females who are pregnant should delay immunization until after  pregnancy.  Pneumococcal 13-valent conjugate (PCV13) vaccine.** / Consult your health care provider.  Pneumococcal polysaccharide (PPSV23) vaccine.** / 1 to 2 doses if you smoke cigarettes or if you have certain conditions.  Meningococcal vaccine.** / 1 dose if you are age 47 to 55 years and a Market researcher living in a residence hall, or have one of several medical conditions, you need to get vaccinated against meningococcal disease. You may also need additional booster doses.  Hepatitis A vaccine.** / Consult your health care provider.  Hepatitis B vaccine.** / Consult your health care provider.  Haemophilus influenzae type b (Hib) vaccine.** / Consult your health care provider. Ages 80 to 42 years  Blood pressure check.** / Every 1 to 2 years.  Lipid and cholesterol check.** / Every 5 years beginning at age 36 years.  Lung cancer screening. / Every year if you are aged 54-80 years and have a 30-pack-year history of smoking and currently smoke or have quit within the past 15 years. Yearly screening is stopped once you have quit smoking for at least 15 years or develop a health problem that would prevent you from having lung cancer treatment.  Clinical breast exam.** / Every year after age 25 years.  BRCA-related cancer risk assessment.** / For women who have family members with a BRCA-related cancer (breast, ovarian, tubal, or peritoneal cancers).  Mammogram.** / Every year beginning at age 73 years and continuing for as long as you are in good health. Consult with your health care provider.  Pap test.** / Every 3 years starting at age 78 years through age 59 or 45 years with a history of 3 consecutive normal Pap tests.  HPV screening.** / Every 3 years from ages 67 years through ages 26 to 24 years with a history of 3 consecutive normal Pap tests.  Fecal occult blood test (FOBT) of stool. / Every year beginning at age 70 years and continuing until age 35 years. You may  not need to do this test if you get a colonoscopy every 10 years.  Flexible sigmoidoscopy or colonoscopy.** / Every 5 years for a flexible sigmoidoscopy or every 10 years for a colonoscopy beginning at age 55 years and continuing until age 57 years.  Hepatitis C blood test.** / For all people born from [birth year] through 1964 and any individual with known risks for hepatitis C.  Skin self-exam. / Monthly.  Influenza vaccine. / Every year.  Tetanus, diphtheria, and acellular pertussis (Tdap/Td) vaccine.** / Consult your health care provider. Pregnant women should receive 1 dose of Tdap vaccine during each pregnancy. 1 dose of Td every 10 years.  Varicella vaccine.** / Consult your health care provider. Pregnant females who do not have evidence of immunity should receive the first dose after pregnancy.  Zoster vaccine.** / 1 dose for adults aged 7 years or older.  Measles, mumps, rubella (MMR) vaccine.** / You need at least 1 dose of MMR if you were born in 1957 or later. You may also need a 2nd dose. For females of childbearing age, rubella immunity should be determined. If there is no evidence of immunity, females who are not pregnant should be vaccinated. If there is no evidence of immunity, females who are pregnant should delay immunization until after pregnancy.  Pneumococcal 13-valent conjugate (PCV13) vaccine.** / Consult your health care provider.  Pneumococcal polysaccharide (PPSV23) vaccine.** /  1 to 2 doses if you smoke cigarettes or if you have certain conditions.  Meningococcal vaccine.** / Consult your health care provider.  Hepatitis A vaccine.** / Consult your health care provider.  Hepatitis B vaccine.** / Consult your health care provider.  Haemophilus influenzae type b (Hib) vaccine.** / Consult your health care provider. Ages 62 years and over  Blood pressure check.** / Every 1 to 2 years.  Lipid and cholesterol check.** / Every 5 years beginning at age 29 years.  Lung  cancer screening. / Every year if you are aged 85-80 years and have a 30-pack-year history of smoking and currently smoke or have quit within the past 15 years. Yearly screening is stopped once you have quit smoking for at least 15 years or develop a health problem that would prevent you from having lung cancer treatment.  Clinical breast exam.** / Every year after age 70 years.  BRCA-related cancer risk assessment.** / For women who have family members with a BRCA-related cancer (breast, ovarian, tubal, or peritoneal cancers).  Mammogram.** / Every year beginning at age 17 years and continuing for as long as you are in good health. Consult with your health care provider.  Pap test.** / Every 3 years starting at age 75 years through age 72 or 44 years with 3 consecutive normal Pap tests. Testing can be stopped between 65 and 70 years with 3 consecutive normal Pap tests and no abnormal Pap or HPV tests in the past 10 years.  HPV screening.** / Every 3 years from ages 22 years through ages 67 or 17 years with a history of 3 consecutive normal Pap tests. Testing can be stopped between 65 and 70 years with 3 consecutive normal Pap tests and no abnormal Pap or HPV tests in the past 10 years.  Fecal occult blood test (FOBT) of stool. / Every year beginning at age 26 years and continuing until age 78 years. You may not need to do this test if you get a colonoscopy every 10 years.  Flexible sigmoidoscopy or colonoscopy.** / Every 5 years for a flexible sigmoidoscopy or every 10 years for a colonoscopy beginning at age 30 years and continuing until age 66 years.  Hepatitis C blood test.** / For all people born from [birth year] through 1964 and any individual with known risks for hepatitis C.  Osteoporosis screening.** / A one-time screening for women ages 86 years and over and women at risk for fractures or osteoporosis.  Skin self-exam. / Monthly.  Influenza vaccine. / Every year.  Tetanus, diphtheria, and  acellular pertussis (Tdap/Td) vaccine.** / 1 dose of Td every 10 years.  Varicella vaccine.** / Consult your health care provider.  Zoster vaccine.** / 1 dose for adults aged 6 years or older.  Pneumococcal 13-valent conjugate (PCV13) vaccine.** / Consult your health care provider.  Pneumococcal polysaccharide (PPSV23) vaccine.** / 1 dose for all adults aged 53 years and older.  Meningococcal vaccine.** / Consult your health care provider.  Hepatitis A vaccine.** / Consult your health care provider.  Hepatitis B vaccine.** / Consult your health care provider.  Haemophilus influenzae type b (Hib) vaccine.** / Consult your health care provider. ** Family history and personal history of risk and conditions may change your health care provider's recommendations. Document Released: 02/09/2002 Document Revised: 04/30/2014 Document Reviewed: 05/11/2011 [facility] Patient Information 2015 [place], [place]. This information is not intended to replace advice given to you by your health care provider. Make sure you discuss any questions you have with  your health care provider.

## 2015-06-21 NOTE — Assessment & Plan Note (Signed)
Rx Flonase.  Supportive measures reviewed with patient.

## 2015-06-21 NOTE — Telephone Encounter (Signed)
Urine shows infection.  Have added on a culture to assess. Rx Cipro sent to pharmacy to take as directed.  Will call with culture results and all other lab results once in.

## 2015-06-21 NOTE — Progress Notes (Signed)
Patient presents to clinic today for annual exam.  Patient is fasting for labs.  Acute Concerns: Patient endorses rhinitis, watery eyes and ear pressure intermittently.  Endorses history of seasonal allergies.  Is currently not taking anything for symptoms.  Patient endorses history of BV.  Notes starting vaginal discharge without pruritus or pain.  Is not currently sexually active.  Chronic Issues: Migraines -- Doing well with current regimen without breakthrough symptoms.   Health Maintenance: Dental -- up-to-date Vision -- up-to-date Immunizations -- Unsure of Tetanus.  Will check her records. PAP -- will be performed today.   Past Medical History  Diagnosis Date  . Sickle cell trait   . Migraines   . Asthma     prn inhaler  . Carpal tunnel syndrome of right wrist 02/2015  . De Quervain's tenosynovitis, right 02/2015    Past Surgical History  Procedure Laterality Date  . Cesarean section  01/02/2009  . Dorsal compartment release Right 03/25/2015    Procedure: RIGHT WRIST DEQUERVAIN RELEASE AND ;  Surgeon: Milly Jakob, MD;  Location: [place];  Service: Orthopedics;  Laterality: Right;  . Carpal tunnel release Right 03/25/2015    Procedure:  ENDOSCOPIC CARPAL TUNNEL RELEASE;  Surgeon: Milly Jakob, MD;  Location: [place];  Service: Orthopedics;  Laterality: Right;    Current Outpatient Prescriptions on File Prior to Visit  Medication Sig Dispense Refill  . ondansetron (ZOFRAN) 4 MG tablet Take 1 tablet (4 mg total) by mouth every 8 (eight) hours as needed for nausea or vomiting. 20 tablet 0   No current facility-administered medications on file prior to visit.    No Known Allergies  Family History  Problem Relation Age of Onset  . Hypertension Mother   . Asthma Father   . Hypertension Father   . Asthma Brother   . Asthma Paternal Aunt   . Diabetes Paternal Aunt   . Cancer Maternal Grandfather     oral cancer  . Diabetes  Maternal Grandfather   . Hypertension Maternal Grandfather     History   Social History  . Marital Status: Single    Spouse Name: N/A  . Number of Children: 1  . Years of Education: 12   Occupational History  . sales    Social History Main Topics  . Smoking status: Never Smoker   . Smokeless tobacco: Never Used  . Alcohol Use: Yes     Comment: occasionally  . Drug Use: No  . Sexual Activity: Yes    Birth Control/ Protection: None   Other Topics Concern  . Not on file   Social History Narrative   Lives with son (2010), employeed at [employer] part-time. Completed some college classes.    Review of Systems  Constitutional: Negative for fever and weight loss.  HENT: Negative for ear discharge, ear pain, hearing loss and tinnitus.   Eyes: Negative for blurred vision, double vision, photophobia and pain.  Respiratory: Negative for cough and shortness of breath.   Cardiovascular: Negative for chest pain and palpitations.  Gastrointestinal: Negative for heartburn, nausea, vomiting, abdominal pain, diarrhea, constipation, blood in stool and melena.  Genitourinary: Negative for dysuria, urgency, frequency, hematuria and flank pain.  Musculoskeletal: Negative for falls.  Neurological: Negative for dizziness, loss of consciousness and headaches.  Endo/Heme/Allergies: Negative for environmental allergies.  Psychiatric/Behavioral: Negative for depression, suicidal ideas, hallucinations and substance abuse. The patient is not nervous/anxious and does not have insomnia.    BP 115/72 mmHg  Pulse 74  Temp(Src) 98.4 F (36.9 C) (Oral)  Ht 4\' 10"  (1.473 m)  Wt 147 lb 9.6 oz (66.951 kg)  BMI 30.86 kg/m2  SpO2 99%  LMP 06/08/2015  Physical Exam  Constitutional: She is oriented to person, place, and time and well-developed, well-nourished, and in no distress.  HENT:  Head: Normocephalic and atraumatic.  Right Ear: Tympanic membrane, external ear and ear canal normal.  Left Ear:  Tympanic membrane, external ear and ear canal normal.  Nose: Nose normal. No mucosal edema.  Mouth/Throat: Uvula is midline, oropharynx is clear and moist and mucous membranes are normal. No oropharyngeal exudate or posterior oropharyngeal erythema.  Eyes: Conjunctivae are normal. Pupils are equal, round, and reactive to light.  Neck: Neck supple. No thyromegaly present.  Cardiovascular: Normal rate, regular rhythm, normal heart sounds and intact distal pulses.   Pulmonary/Chest: Effort normal and breath sounds normal. No respiratory distress. She has no wheezes. She has no rales.  Abdominal: Soft. Bowel sounds are normal. She exhibits no distension and no mass. There is no tenderness. There is no rebound and no guarding.  Genitourinary: Uterus normal, cervix normal, right adnexa normal, left adnexa normal and vulva normal. Thin  white and vaginal discharge found.  Lymphadenopathy:    She has no cervical adenopathy.  Neurological: She is alert and oriented to person, place, and time. No cranial nerve deficit.  Skin: Skin is warm and dry. No rash noted.  Psychiatric: Affect normal.  Vitals reviewed.  Recent Results (from the past 2160 hour(s))  Hemoglobin-hemacue, POC     Status: None   Collection Time: 03/25/15  9:07 AM  Result Value Ref Range   Hemoglobin 12.1 12.0 - 15.0 g/dL    Assessment/Plan: Visit for preventive health examination Depression scree negative.  Health Maintenance reviewed.  Patient will check home records for Tetanus vaccination.  Will obtain fasting labs today. Preventive schedule discussed and handout given in AVS.  Migraines Doing well.  Continue current regimen. Medications refilled.  Rhinitis, allergic Rx Flonase.  Supportive measures reviewed with patient.   Encounter for cervical Pap smear with pelvic exam Exam with thin white discharge.  PAP obtained.  Will send Wet prep.  Will treat based on results.

## 2015-06-21 NOTE — Assessment & Plan Note (Signed)
Doing well.  Continue current regimen. Medications refilled.

## 2015-06-24 ENCOUNTER — Telehealth: Payer: Self-pay | Admitting: *Deleted

## 2015-06-24 ENCOUNTER — Telehealth: Payer: Self-pay | Admitting: Physician Assistant

## 2015-06-24 DIAGNOSIS — N3001 Acute cystitis with hematuria: Secondary | ICD-10-CM

## 2015-06-24 DIAGNOSIS — R7989 Other specified abnormal findings of blood chemistry: Secondary | ICD-10-CM

## 2015-06-24 LAB — URINE CULTURE: Colony Count: >=100000

## 2015-06-24 LAB — CYTOLOGY - PAP

## 2015-06-24 LAB — CERVICOVAGINAL ANCILLARY ONLY: CANDIDA VAGINITIS: NEGATIVE

## 2015-06-24 MED ORDER — CEFDINIR 300 MG PO CAPS: ORAL_CAPSULE | ORAL | Status: DC

## 2015-06-24 MED ORDER — METRONIDAZOLE 0.75 % EX GEL: CUTANEOUS | Status: DC

## 2015-06-24 MED ORDER — ALBUTEROL SULFATE HFA 108 (90 BASE) MCG/ACT IN AERS: 2.0000 | INHALATION_SPRAY | Freq: Four times a day (QID) | RESPIRATORY_TRACT | Status: DC | PRN

## 2015-06-24 NOTE — Telephone Encounter (Signed)
-----   Message from Brunetta Jeans, PA-C sent at 06/24/2015  7:54 AM EDT ----- Labs are good overall. TSH slightly low which could be a lab error but if accurate means her thyroid is slightly overactive.  Will need another measurement to confirm/rule out this.  Want her to return to lab in 2 weeks for a repeat TSH level.  Also her urine culture confirmed a UTI that is resistant to the antibiotic given.  Ok to send in Rx for Cefdinir 300 mg BID x 7 days. Quantity 14. 0 refills. Dx: Acute cystitis.

## 2015-06-24 NOTE — Telephone Encounter (Signed)
Wet prep is + for bacterial vaginosis.  She is currently being treated for a UTI. Will send in a prescription for Metrogel to apply vaginally as directed to resolve infection.  Start a daily probiotic.

## 2015-06-24 NOTE — Telephone Encounter (Signed)
Called and spoke with the pt and informed her of recent lab and urine culture results and note.  Pt verbalized understanding.  Pt was scheduled a lab appt for repeat TSH on Friday (July 15,16 @ 3:00pm) and future order placed for the TSH.  New rx sent to the pharmacy by e-script.//AB/CMA

## 2015-06-24 NOTE — Telephone Encounter (Signed)
Called and spoke with the pt and informed her of the note below.  Pt verbalized understanding and agreed.//AB/CMA 

## 2015-06-26 ENCOUNTER — Telehealth: Payer: Self-pay | Admitting: Physician Assistant

## 2015-06-26 NOTE — Telephone Encounter (Signed)
Verbally informed Kimberly Guerra of note below.  Per-Cody-yes this is normal she had a UTI and BV.  She's to continue the [facility] and drink plenty of fluids,and if she not any better in a few days please let us know.  Called and informed the pt of Cody's recommendation.  She verbalized understanding and agreed.//AB/CMA

## 2015-06-26 NOTE — Telephone Encounter (Signed)
Caller name: Cleatus Relation to pt: Call back number: [phone] Pharmacy: [facility] [address]  Reason for call:   Patient states that she was seen last Friday for uti and is now having lower back pain. She wants to know if this is normal?

## 2015-07-10 ENCOUNTER — Other Ambulatory Visit: Payer: Self-pay | Admitting: Physician Assistant

## 2015-07-10 ENCOUNTER — Telehealth: Payer: Self-pay | Admitting: Physician Assistant

## 2015-07-10 MED ORDER — ELETRIPTAN HYDROBROMIDE 20 MG PO TABS: 20.0000 mg | ORAL_TABLET | ORAL | Status: DC | PRN

## 2015-07-10 NOTE — Telephone Encounter (Signed)
Called and [facility] @ 2:29pm @ [phone]) informing the pt that refills were sent to the pharmacy by e-script.//AB/CMA

## 2015-07-10 NOTE — Telephone Encounter (Signed)
Caller name:Maudry Glennon Mac Relationship to patient:self Can be FSFSELT:[phone] Pharmacy:[facility] [address]  Reason for call:Requesting refill on relpax

## 2015-07-12 ENCOUNTER — Other Ambulatory Visit (INDEPENDENT_AMBULATORY_CARE_PROVIDER_SITE_OTHER): Payer: BLUE CROSS/BLUE SHIELD

## 2015-07-12 DIAGNOSIS — R7989 Other specified abnormal findings of blood chemistry: Secondary | ICD-10-CM | POA: Diagnosis not present

## 2015-07-13 LAB — TSH: TSH: 0.502 u[IU]/mL (ref 0.350–4.500)

## 2015-07-15 ENCOUNTER — Telehealth: Payer: Self-pay | Admitting: *Deleted

## 2015-07-15 DIAGNOSIS — R7989 Other specified abnormal findings of blood chemistry: Secondary | ICD-10-CM

## 2015-07-15 NOTE — Telephone Encounter (Signed)
Called and spoke with the pt and informed her of recent lab results and note.  Pt verbalized understanding and agreed.  Pt was scheduled a lab appt for (Tues. 10/15/15 @ 8:45am) and future labs ordered and sent.//AB/CMA

## 2015-07-15 NOTE — Telephone Encounter (Signed)
-----   Message from Brunetta Jeans, PA-C sent at 07/13/2015 12:08 PM EDT ----- Repeat TSH level looks good. Recommend we monitor this every 3 months over next 6 months to make sure thyroid function is stable seeing as how we have had one borderline abnormal result. Sometimes thyroid levels will teeter between normal and slightly normal before changing significantly. I just want to make certain she is not developing thyroid issue. Follow-up 3 months for repeat TSH and T4.

## 2015-09-18 ENCOUNTER — Encounter: Payer: Self-pay | Admitting: Physician Assistant

## 2015-09-18 ENCOUNTER — Ambulatory Visit (INDEPENDENT_AMBULATORY_CARE_PROVIDER_SITE_OTHER): Payer: BLUE CROSS/BLUE SHIELD | Admitting: Physician Assistant

## 2015-09-18 VITALS — BP 137/82 | HR 76 | Temp 97.5°F | Resp 16 | Ht <= 58 in | Wt 148.4 lb

## 2015-09-18 DIAGNOSIS — G43709 Chronic migraine without aura, not intractable, without status migrainosus: Secondary | ICD-10-CM

## 2015-09-18 NOTE — Progress Notes (Signed)
Patient presents to clinic today c/o migraines off and on for the past week. Endorses phonophobia and photophobia with nausea. Relpax is helping but the headaches are coming back each day. Denies fever, chills, malaise, LH. Notes intermittent dizziness with these headaches and vision changes.  Past Medical History  Diagnosis Date  . Sickle cell trait   . Migraines   . Asthma     prn inhaler  . Carpal tunnel syndrome of right wrist 02/2015  . De Quervain's tenosynovitis, right 02/2015    Current Outpatient Prescriptions on File Prior to Visit  Medication Sig Dispense Refill  . albuterol (PROVENTIL HFA;VENTOLIN HFA) 108 (90 BASE) MCG/ACT inhaler Inhale 2 puffs into the lungs every 6 (six) hours as needed. For wheeze or shortness of breath 1 Inhaler 5  . eletriptan (RELPAX) 20 MG tablet Take 1 tablet (20 mg total) by mouth as needed for migraine or headache. May repeat in 2 hours if headache persists or recurs. 10 tablet 3  . fluticasone (FLONASE) 50 MCG/ACT nasal spray Place 2 sprays into both nostrils daily. 16 g 6  . metoprolol tartrate (LOPRESSOR) 25 MG tablet Take 1 tablet (25 mg total) by mouth 2 (two) times daily. 60 tablet 5  . ondansetron (ZOFRAN) 4 MG tablet Take 1 tablet (4 mg total) by mouth every 8 (eight) hours as needed for nausea or vomiting. 20 tablet 0   No current facility-administered medications on file prior to visit.    No Known Allergies  Family History  Problem Relation Age of Onset  . Hypertension Mother   . Asthma Father   . Hypertension Father   . Asthma Brother   . Asthma Paternal Aunt   . Diabetes Paternal Aunt   . Cancer Maternal Grandfather     oral cancer  . Diabetes Maternal Grandfather   . Hypertension Maternal Grandfather     Social History   Social History  . Marital Status: Single    Spouse Name: N/A  . Number of Children: 1  . Years of Education: 12   Occupational History  . sales    Social History Main Topics  . Smoking status:  Never Smoker   . Smokeless tobacco: Never Used  . Alcohol Use: Yes     Comment: occasionally  . Drug Use: No  . Sexual Activity: Yes    Birth Control/ Protection: None   Other Topics Concern  . Not on file   Social History Narrative   Lives with son (2010), employeed at [employer] part-time. Completed some college classes.     Review of Systems - See HPI.  All other ROS are negative.  BP 137/82 mmHg  Pulse 76  Temp(Src) 97.5 F (36.4 C) (Oral)  Resp 16  Ht 4\' 10"  (1.473 m)  Wt 148 lb 6 oz (67.302 kg)  BMI 31.02 kg/m2  SpO2 98%  LMP 09/07/2015  Physical Exam  Constitutional: She is oriented to person, place, and time and well-developed, well-nourished, and in no distress.  HENT:  Head: Normocephalic and atraumatic.  Eyes: Conjunctivae are normal.  Neck: Neck supple.  Cardiovascular: Normal rate, regular rhythm, normal heart sounds and intact distal pulses.   Pulmonary/Chest: Effort normal and breath sounds normal. No respiratory distress. She has no wheezes. She has no rales. She exhibits no tenderness.  Neurological: She is alert and oriented to person, place, and time.  Skin: Skin is warm and dry. No rash noted.  Psychiatric: Affect normal.  Vitals reviewed.   Recent Results (  from the past 2160 hour(s))  Cytology - PAP     Status: None   Collection Time: 06/21/15 12:00 AM  Result Value Ref Range   CYTOLOGY - PAP PAP RESULT   Cervicovaginal ancillary only     Status: Abnormal   Collection Time: 06/21/15 12:00 AM  Result Value Ref Range   Bacterial vaginitis (A)     **POSITIVE for Atopobium vaginae POSITIVE for Megasphaera 1 POSITIVE for BVAB2**    Comment: Normal Reference Range - Negative   Candida vaginitis Negative for Candida Vaginitis Microorganisms     Comment: Normal Reference Range - Negative  CBC     Status: Abnormal   Collection Time: 06/21/15  9:01 AM  Result Value Ref Range   WBC 6.6 4.0 - 10.5 K/uL   RBC 4.87 3.87 - 5.11 Mil/uL   Platelets 413.0  (H) 150.0 - 400.0 K/uL   Hemoglobin 12.5 12.0 - 15.0 g/dL   HCT 38.2 36.0 - 46.0 %   MCV 78.5 78.0 - 100.0 fl   MCHC 32.8 30.0 - 36.0 g/dL   RDW 15.9 (H) 11.5 - 08.1 %  Basic Metabolic Panel (BMET)     Status: None   Collection Time: 06/21/15  9:01 AM  Result Value Ref Range   Sodium 136 135 - 145 mEq/L   Potassium 4.1 3.5 - 5.1 mEq/L   Chloride 104 96 - 112 mEq/L   CO2 26 19 - 32 mEq/L   Glucose, Bld 84 70 - 99 mg/dL   BUN 10 6 - 23 mg/dL   Creatinine, Ser 0.62 0.40 - 1.20 mg/dL   Calcium 9.6 8.4 - 10.5 mg/dL   GFR 146.98 >60.00 mL/min  Hepatic function panel     Status: None   Collection Time: 06/21/15  9:01 AM  Result Value Ref Range   Total Bilirubin 0.3 0.2 - 1.2 mg/dL   Bilirubin, Direct 0.1 0.0 - 0.3 mg/dL   Alkaline Phosphatase 95 39 - 117 U/L   AST 17 0 - 37 U/L   ALT 14 0 - 35 U/L   Total Protein 8.0 6.0 - 8.3 g/dL   Albumin 4.3 3.5 - 5.2 g/dL  TSH     Status: Abnormal   Collection Time: 06/21/15  9:01 AM  Result Value Ref Range   TSH 0.32 (L) 0.35 - 4.50 uIU/mL  Lipid Profile     Status: None   Collection Time: 06/21/15  9:01 AM  Result Value Ref Range   Cholesterol 158 0 - 200 mg/dL    Comment: ATP III Classification       Desirable:  < 200 mg/dL               Borderline High:  200 - 239 mg/dL          High:  > = 240 mg/dL   Triglycerides 42.0 0.0 - 149.0 mg/dL    Comment: Normal:  <150 mg/dLBorderline High:  150 - 199 mg/dL   HDL 59.00 >39.00 mg/dL   VLDL 8.4 0.0 - 40.0 mg/dL   LDL Cholesterol 91 0 - 99 mg/dL   Total CHOL/HDL Ratio 3     Comment:                Men          Women1/2 Average Risk     3.4          3.3Average Risk          5.0  4.42X Average Risk          9.6          7.13X Average Risk          15.0          11.0                       NonHDL 99.00     Comment: NOTE:  Non-HDL goal should be 30 mg/dL higher than patient's LDL goal (i.e. LDL goal of < 70 mg/dL, would have non-HDL goal of < 100 mg/dL)  Urinalysis, Routine w reflex  microscopic (not at [facility])     Status: Abnormal   Collection Time: 06/21/15  9:01 AM  Result Value Ref Range   Color, Urine YELLOW Yellow;Lt. Yellow   APPearance SL CLOUDY (A) Clear   Specific Gravity, Urine 1.020 1.000-1.030   pH 5.0 5.0 - 8.0   Total Protein, Urine NEGATIVE Negative   Urine Glucose NEGATIVE Negative   Ketones, ur NEGATIVE Negative   Bilirubin Urine NEGATIVE Negative   Hgb urine dipstick TRACE-LYSED (A) Negative   Urobilinogen, UA 0.2 0.0 - 1.0   Leukocytes, UA NEGATIVE Negative   Nitrite POSITIVE (A) Negative   WBC, UA 21-50/hpf (A) 0-2/hpf    Comment: /Results faxed to site/floor on 06/21/2015 2:14 PM by Delorise Colclasure.   RBC / HPF 0-2/hpf 0-2/hpf   Squamous Epithelial / LPF Rare(0-4/hpf) Rare(0-4/hpf)   Bacteria, UA Many(>50/hpf) (A) None  Urine culture     Status: None   Collection Time: 06/21/15  5:04 PM  Result Value Ref Range   Culture ESCHERICHIA COLI    Colony Count >=100,000 COLONIES/ML    Organism ID, Bacteria ESCHERICHIA COLI       Susceptibility   Escherichia coli -  (no method available)    AMPICILLIN  Resistant     AMOX/CLAVULANIC 4 Sensitive     AMPICILLIN/SULBACTAM 4 Sensitive     PIP/TAZO <=4 Sensitive     IMIPENEM <=0.25 Sensitive     CEFAZOLIN <=4 Sensitive     CEFTRIAXONE <=1 Sensitive     CEFTAZIDIME <=1 Sensitive     CEFEPIME <=1 Sensitive     GENTAMICIN <=1 Sensitive     TOBRAMYCIN <=1 Sensitive     CIPROFLOXACIN >=4 Resistant     LEVOFLOXACIN >=8 Resistant     NITROFURANTOIN 32 Sensitive     TRIMETH/SULFA <=20 Sensitive   TSH     Status: None   Collection Time: 07/12/15  2:43 PM  Result Value Ref Range   TSH 0.502 0.350 - 4.500 uIU/mL    Assessment/Plan: Migraines 60 Toradol given. Continue Relpax and Metoprolol as directed. Increase fluid intake. Will obtain CT Head and refer to Neurology at [facility]. Alarm signs/symptoms discussed with patient. ER if any occur.

## 2015-09-18 NOTE — Progress Notes (Signed)
Pre visit review using our clinic review tool, if applicable. No additional management support is needed unless otherwise documented below in the visit note/SLS  

## 2015-09-18 NOTE — Assessment & Plan Note (Signed)
60 Toradol given. Continue Relpax and Metoprolol as directed. Increase fluid intake. Will obtain CT Head and refer to Neurology at [facility]. Alarm signs/symptoms discussed with patient. ER if any occur.

## 2015-09-18 NOTE — Patient Instructions (Signed)
Please continue medications as directed. The medication given in office today should stop the migraine.  You will be contacted for a CT scan of the Head. I will call with results. I am setting you up with Neurology at [facility] in [place] for ongoing care since you are not responding well to most medications used to prevent migraines.  If anything acutely worsens, please call 911 or have someone carry you to the ER.

## 2015-09-21 ENCOUNTER — Ambulatory Visit (HOSPITAL_BASED_OUTPATIENT_CLINIC_OR_DEPARTMENT_OTHER)
Admission: RE | Admit: 2015-09-21 | Discharge: 2015-09-21 | Disposition: A | Discharge status: Home / Self Care | Payer: BLUE CROSS/BLUE SHIELD | Source: Ambulatory Visit | Attending: Physician Assistant | Admitting: Physician Assistant

## 2015-09-21 DIAGNOSIS — G43709 Chronic migraine without aura, not intractable, without status migrainosus: Secondary | ICD-10-CM

## 2015-09-21 DIAGNOSIS — G43909 Migraine, unspecified, not intractable, without status migrainosus: Secondary | ICD-10-CM | POA: Insufficient documentation

## 2015-09-23 ENCOUNTER — Other Ambulatory Visit: Payer: Self-pay | Admitting: Physician Assistant

## 2015-09-23 MED ORDER — MECLIZINE HCL 25 MG PO TABS: 25.0000 mg | ORAL_TABLET | Freq: Three times a day (TID) | ORAL | Status: DC | PRN

## 2015-10-07 ENCOUNTER — Ambulatory Visit (INDEPENDENT_AMBULATORY_CARE_PROVIDER_SITE_OTHER): Payer: BLUE CROSS/BLUE SHIELD | Admitting: Neurology

## 2015-10-07 ENCOUNTER — Encounter: Payer: Self-pay | Admitting: Neurology

## 2015-10-07 VITALS — BP 100/64 | HR 72 | Ht <= 58 in | Wt 150.0 lb

## 2015-10-07 DIAGNOSIS — G43719 Chronic migraine without aura, intractable, without status migrainosus: Secondary | ICD-10-CM | POA: Insufficient documentation

## 2015-10-07 MED ORDER — NAPROXEN SODIUM 550 MG PO TABS: 550.0000 mg | ORAL_TABLET | Freq: Two times a day (BID) | ORAL | Status: DC | PRN

## 2015-10-07 MED ORDER — ELETRIPTAN HYDROBROMIDE 40 MG PO TABS: ORAL_TABLET | ORAL | Status: DC

## 2015-10-07 MED ORDER — METOPROLOL TARTRATE 50 MG PO TABS: 50.0000 mg | ORAL_TABLET | Freq: Two times a day (BID) | ORAL | Status: DC

## 2015-10-07 NOTE — Progress Notes (Signed)
NEUROLOGY CONSULTATION NOTE  Kimberly Guerra MRN: [id number] DOB: Jan 21, 1986  Referring provider: Brunetta Jeans, PA-C Primary care provider: Brunetta Jeans, PA-C  Reason for consult:  Migraine  HISTORY OF PRESENT ILLNESS: Kimberly Guerra is a 29 year old right-handed female with migraines, asthma, sickle cell trait who presents for migraine.  History obtained by patient and PCP note.  Imaged of head CT and MRI personally reviewed.  Onset:  29 years old Location:  holocephalic Quality:  Pounding, throbbing, pins and needles sensation Intensity:  10/10 Aura:  Dark spots preceding headache Prodrome:  none Associated symptoms:  Nausea, vomiting, photophobia, phonophobia Duration:  3 days (Relpax helps but headaches rebound back later) Frequency:  25 headache days per month (15 days severe) Triggers/exacerbating factors:  perfumes Relieving factors:  sleep Activity:  Cannot function 10 days per month  Past abortive medication:  Maxalt (anaphylactic reaction), sumatriptan, Excedrin, Tylenol, Advil, naproxen, ketoprofen Past preventative medication:  gabapentin Other past therapy:  trigger point injections  Current abortive medication:  Relpax 20mg  Antihypertensive medications:  Metoprolol 25mg  twice daily Antidepressant medications:  none Anticonvulsant medications:  none Vitamins/Herbal/Supplements:  MVI Other therapy:  none  MRI of the brain performed on 02/16/14 showed small 8 x65mm cystic lesion to the left of the cerebellar vermis.  Imaging with contrast performed on 04/05/14 showed no enhancement.  CT of head performed on 09/21/15 was unremarkable.  Caffeine:  rarely Alcohol:  rarely Smoker:  no Diet:  Could be better.  Keeps hydrated Exercise:  Not routine Depression/stress:  no Sleep hygiene:  Poor She works at [employer].  She has a son. Family history of headache:  Mother, father.  Maternal grandmother had a brain tumor.  PAST MEDICAL HISTORY: Past  Medical History  Diagnosis Date  . Sickle cell trait   . Migraines   . Asthma     prn inhaler  . Carpal tunnel syndrome of right wrist 02/2015  . De Quervain's tenosynovitis, right 02/2015    PAST SURGICAL HISTORY: Past Surgical History  Procedure Laterality Date  . Cesarean section  01/02/2009  . Dorsal compartment release Right 03/25/2015    Procedure: RIGHT WRIST DEQUERVAIN RELEASE AND ;  Surgeon: Milly Jakob, MD;  Location: [facility];  Service: Orthopedics;  Laterality: Right;  . Carpal tunnel release Right 03/25/2015    Procedure:  ENDOSCOPIC CARPAL TUNNEL RELEASE;  Surgeon: Milly Jakob, MD;  Location: [facility];  Service: Orthopedics;  Laterality: Right;    MEDICATIONS: Current Outpatient Prescriptions on File Prior to Visit  Medication Sig Dispense Refill  . albuterol (PROVENTIL HFA;VENTOLIN HFA) 108 (90 BASE) MCG/ACT inhaler Inhale 2 puffs into the lungs every 6 (six) hours as needed. For wheeze or shortness of breath 1 Inhaler 5  . fluticasone (FLONASE) 50 MCG/ACT nasal spray Place 2 sprays into both nostrils daily. 16 g 6  . meclizine (ANTIVERT) 25 MG tablet Take 1 tablet (25 mg total) by mouth 3 (three) times daily as needed for dizziness. 30 tablet 0  . ondansetron (ZOFRAN) 4 MG tablet Take 1 tablet (4 mg total) by mouth every 8 (eight) hours as needed for nausea or vomiting. 20 tablet 0   No current facility-administered medications on file prior to visit.    ALLERGIES: No Known Allergies  FAMILY HISTORY: Family History  Problem Relation Age of Onset  . Hypertension Mother   . Asthma Father   . Hypertension Father   . Asthma Brother   . Asthma Paternal Aunt   .  Diabetes Paternal Aunt   . Cancer Maternal Grandfather     oral cancer  . Diabetes Maternal Grandfather   . Hypertension Maternal Grandfather     SOCIAL HISTORY: Social History   Social History  . Marital Status: Single    Spouse Name: N/A  . Number of  Children: 1  . Years of Education: 12   Occupational History  . sales    Social History Main Topics  . Smoking status: Never Smoker   . Smokeless tobacco: Never Used  . Alcohol Use: Yes     Comment: occasionally  . Drug Use: No  . Sexual Activity: Yes    Birth Control/ Protection: None   Other Topics Concern  . Not on file   Social History Narrative   Lives with son (2010), employeed at [employer] part-time. Completed some college classes.     REVIEW OF SYSTEMS: Constitutional: No fevers, chills, or sweats, no generalized fatigue, change in appetite Eyes: No visual changes, double vision, eye pain Ear, nose and throat: No hearing loss, ear pain, nasal congestion, sore throat Cardiovascular: No chest pain, palpitations Respiratory:  No shortness of breath at rest or with exertion, wheezes GastrointestinaI: No nausea, vomiting, diarrhea, abdominal pain, fecal incontinence Genitourinary:  No dysuria, urinary retention or frequency Musculoskeletal:  No neck pain, back pain Integumentary: No rash, pruritus, skin lesions Neurological: as above Psychiatric: No depression, insomnia, anxiety Endocrine: No palpitations, fatigue, diaphoresis, mood swings, change in appetite, change in weight, increased thirst Hematologic/Lymphatic:  No anemia, purpura, petechiae. Allergic/Immunologic: no itchy/runny eyes, nasal congestion, recent allergic reactions, rashes  PHYSICAL EXAM: Filed Vitals:   10/07/15 0951  BP: 100/64  Pulse: 72   General: No acute distress.  Patient appears well-groomed.  Head:  Normocephalic/atraumatic Eyes:  fundi unremarkable, without vessel changes, exudates, hemorrhages or papilledema. Neck: supple, no paraspinal tenderness, full range of motion Back: No paraspinal tenderness Heart: regular rate and rhythm Lungs: Clear to auscultation bilaterally. Vascular: No carotid bruits. Neurological Exam: Mental status: alert and oriented to person, place, and time,  recent and remote memory intact, fund of knowledge intact, attention and concentration intact, speech fluent and not dysarthric, language intact. Cranial nerves: CN I: not tested CN II: pupils equal, round and reactive to light, visual fields intact, fundi unremarkable, without vessel changes, exudates, hemorrhages or papilledema. CN III, IV, VI:  full range of motion, no nystagmus, no ptosis CN V: facial sensation intact CN VII: upper and lower face symmetric CN VIII: hearing intact CN IX, X: gag intact, uvula midline CN XI: sternocleidomastoid and trapezius muscles intact CN XII: tongue midline Bulk & Tone: normal, no fasciculations. Motor:  5/5 throughout Sensation: temperature and vibration sensation intact. Deep Tendon Reflexes:  2+ throughout, toes downgoing.  Finger to nose testing:  Without dysmetria.  Heel to shin:  Without dysmetria.  Gait:  Normal station and stride.  Able to turn and tandem walk. Romberg negative.  IMPRESSION: Chronic migraine with aura  PLAN: Increase metoprolol to 50mg  twice daily.  Caution for lightheadedness or dizziness.  She will update [place] in 4 weeks. For abortive therapy, will try Relpax 40mg  with naproxen 550mg  Sleep hygiene Exercise Mediterranean diet Follow up in 3 months.  Thank you for allowing me to take part in the care of this patient.  Metta Clines, DO  CC:  Brunetta Jeans, PA-C

## 2015-10-07 NOTE — Patient Instructions (Signed)
Migraine Recommendations: 1.  Increase metoprolol to 50mg  twice daily.  Be cautious of lightheadedness or dizziness.  Call in 4 weeks with update.  Call sooner if lightheaded or dizzy. 2.  Take Relpax 40mg  with naproxen 550mg  at earliest onset of headache.  May repeat dose of Relpax 40mg  once in 2 hours if needed (not the naproxen).  Do not exceed two doses in 24 hours. 3.  Limit use of pain relievers to no more than 2 days out of the week.  These medications include acetaminophen, ibuprofen, triptans and narcotics.  This will help reduce risk of rebound headaches. 4.  Be aware of common food triggers such as processed sweets, processed foods with nitrites (such as deli meat, hot dogs, sausages), foods with MSG, alcohol (such as wine), chocolate, certain cheeses, certain fruits (dried fruits, some citrus fruit), vinegar, diet soda. 4.  Avoid caffeine 5.  Routine exercise 6.  Proper sleep hygiene 7.  Stay adequately hydrated with water 8.  Keep a headache diary. 9.  Maintain proper stress management. 10.  Do not skip meals. 11.  Consider supplements:  Magnesium oxide 400mg  to 600mg  daily, riboflavin 400mg , Coenzyme Q 10 100mg  three times daily 12.  Follow up in 3 months.

## 2015-10-15 ENCOUNTER — Other Ambulatory Visit (INDEPENDENT_AMBULATORY_CARE_PROVIDER_SITE_OTHER): Payer: BLUE CROSS/BLUE SHIELD

## 2015-10-15 DIAGNOSIS — R7989 Other specified abnormal findings of blood chemistry: Secondary | ICD-10-CM

## 2015-10-15 LAB — TSH: TSH: 0.4 u[IU]/mL (ref 0.35–4.50)

## 2015-10-15 LAB — T4: T4, Total: 9.3 ug/dL (ref 4.5–12.0)

## 2015-10-30 ENCOUNTER — Other Ambulatory Visit: Payer: Self-pay | Admitting: Physician Assistant

## 2015-10-30 MED ORDER — ELETRIPTAN HYDROBROMIDE 40 MG PO TABS: ORAL_TABLET | ORAL | Status: DC

## 2015-10-30 NOTE — Telephone Encounter (Signed)
Caller name:Harveen   Relationship to patient: Self  Can be reached: [phone]  Pharmacy:[id number] [place], [place] - [place].  Reason for call: pt is requesting a refill on her RELPAX Rx.

## 2015-10-30 NOTE — Telephone Encounter (Signed)
Refill sent.

## 2016-01-20 ENCOUNTER — Ambulatory Visit: Payer: BLUE CROSS/BLUE SHIELD | Admitting: Neurology

## 2016-01-21 ENCOUNTER — Telehealth: Payer: Self-pay | Admitting: Physician Assistant

## 2016-01-21 NOTE — Telephone Encounter (Signed)
Left message for patient to call about flu shot °

## 2016-06-25 ENCOUNTER — Ambulatory Visit: Payer: BLUE CROSS/BLUE SHIELD | Admitting: Medical

## 2016-08-14 ENCOUNTER — Other Ambulatory Visit (HOSPITAL_COMMUNITY)
Admission: RE | Admit: 2016-08-14 | Discharge: 2016-08-14 | Disposition: A | Discharge status: Home / Self Care | Payer: BLUE CROSS/BLUE SHIELD | Source: Ambulatory Visit | Attending: Physician Assistant | Admitting: Physician Assistant

## 2016-08-14 ENCOUNTER — Ambulatory Visit (INDEPENDENT_AMBULATORY_CARE_PROVIDER_SITE_OTHER): Payer: BLUE CROSS/BLUE SHIELD | Admitting: Physician Assistant

## 2016-08-14 ENCOUNTER — Encounter: Payer: Self-pay | Admitting: Physician Assistant

## 2016-08-14 VITALS — BP 141/85 | HR 82 | Temp 97.7°F | Ht <= 58 in | Wt 154.2 lb

## 2016-08-14 DIAGNOSIS — N390 Urinary tract infection, site not specified: Secondary | ICD-10-CM | POA: Diagnosis not present

## 2016-08-14 DIAGNOSIS — N760 Acute vaginitis: Secondary | ICD-10-CM | POA: Diagnosis present

## 2016-08-14 DIAGNOSIS — B373 Candidiasis of vulva and vagina: Secondary | ICD-10-CM

## 2016-08-14 DIAGNOSIS — Z113 Encounter for screening for infections with a predominantly sexual mode of transmission: Secondary | ICD-10-CM | POA: Insufficient documentation

## 2016-08-14 DIAGNOSIS — B3731 Acute candidiasis of vulva and vagina: Secondary | ICD-10-CM

## 2016-08-14 DIAGNOSIS — N9089 Other specified noninflammatory disorders of vulva and perineum: Secondary | ICD-10-CM

## 2016-08-14 DIAGNOSIS — N3000 Acute cystitis without hematuria: Secondary | ICD-10-CM | POA: Insufficient documentation

## 2016-08-14 LAB — POC URINALSYSI DIPSTICK (AUTOMATED)
Bilirubin, UA: NEGATIVE
Glucose, UA: NEGATIVE
KETONES UA: NEGATIVE
Leukocytes, UA: NEGATIVE
Nitrite, UA: POSITIVE
PROTEIN UA: NEGATIVE
RBC UA: NEGATIVE
UROBILINOGEN UA: NEGATIVE
pH, UA: 5.5

## 2016-08-14 MED ORDER — ALBUTEROL SULFATE HFA 108 (90 BASE) MCG/ACT IN AERS: 2.0000 | INHALATION_SPRAY | Freq: Four times a day (QID) | RESPIRATORY_TRACT | 5 refills | Status: DC | PRN

## 2016-08-14 MED ORDER — FLUCONAZOLE 150 MG PO TABS: 150.0000 mg | ORAL_TABLET | Freq: Once | ORAL | 0 refills | Status: AC

## 2016-08-14 MED ORDER — CIPROFLOXACIN HCL 500 MG PO TABS: 500.0000 mg | ORAL_TABLET | Freq: Two times a day (BID) | ORAL | 0 refills | Status: DC

## 2016-08-14 MED ORDER — METOPROLOL TARTRATE 50 MG PO TABS: 50.0000 mg | ORAL_TABLET | Freq: Two times a day (BID) | ORAL | 0 refills | Status: DC

## 2016-08-14 NOTE — Progress Notes (Signed)
Patient presents to clinic today c/o 5 days of thick white vaginal discharge with pruritus and burning in the pelvic region. Patient endorses some urinary urgency. Denies fever, chills, nausea or vomiting. Denies abdominal pain or back pain. Denies hematuria or dysuria.. Would like to have routine STI testing. Is sexually active with one female partner -- boyfriend of 2.5 years.   Patient also notes one episode of anal itching. Notes small amount of blood on toilet paper. Denies recurrence. Denies melena or hematochezia.   Past Medical History:  Diagnosis Date  . Asthma    prn inhaler  . Carpal tunnel syndrome of right wrist 02/2015  . De Quervain's tenosynovitis, right 02/2015  . Migraines   . Sickle cell trait [facility])     Current Outpatient Prescriptions on File Prior to Visit  Medication Sig Dispense Refill  . eletriptan (RELPAX) 40 MG tablet May repeat dose once in 2 hours if headache persists or recurs. 10 tablet 3  . fluticasone (FLONASE) 50 MCG/ACT nasal spray Place 2 sprays into both nostrils daily. 16 g 6  . ondansetron (ZOFRAN) 4 MG tablet Take 1 tablet (4 mg total) by mouth every 8 (eight) hours as needed for nausea or vomiting. 20 tablet 0   No current facility-administered medications on file prior to visit.     No Known Allergies  Family History  Problem Relation Age of Onset  . Hypertension Mother   . Asthma Father   . Hypertension Father   . Asthma Brother   . Asthma Paternal Aunt   . Diabetes Paternal Aunt   . Cancer Maternal Grandfather     oral cancer  . Diabetes Maternal Grandfather   . Hypertension Maternal Grandfather     Social History   Social History  . Marital status: Single    Spouse name: N/A  . Number of children: 1  . Years of education: 73   Occupational History  . sales [employer]   Social History Main Topics  . Smoking status: Never Smoker  . Smokeless tobacco: Never Used  . Alcohol use Yes     Comment: occasionally  . Drug use: No    . Sexual activity: Yes    Birth control/ protection: None   Other Topics Concern  . None   Social History Narrative   Lives with son (2010), employeed at [employer] part-time. Completed some college classes.    Review of Systems - See HPI.  All other ROS are negative.  BP (!) 141/85 (BP Location: Left Arm, Patient Position: Sitting, Cuff Size: Normal)   Pulse 82   Temp 97.7 F (36.5 C) (Oral)   Ht 4\' 10"  (1.473 m)   Wt 154 lb 3.2 oz (69.9 kg)   LMP 07/24/2016   SpO2 99%   BMI 32.23 kg/m   Physical Exam  Constitutional: She is oriented to person, place, and time and well-developed, well-nourished, and in no distress.  HENT:  Head: Normocephalic and atraumatic.  Cardiovascular: Normal rate and regular rhythm.   Pulmonary/Chest: Effort normal and breath sounds normal. No respiratory distress. She has no wheezes. She has no rales. She exhibits no tenderness.  Abdominal: Soft. Bowel sounds are normal. She exhibits no distension and no mass. There is no tenderness. There is no rebound and no guarding.  Genitourinary: Uterus normal, cervix normal, right adnexa normal and left adnexa normal. Rectal exam shows no external hemorrhoid and no mass. Cervix exhibits no motion tenderness. Vulva exhibits no erythema, no lesion and no  rash. Vagina exhibits normal mucosa and no lesion. Thick  white and vaginal discharge found.  Genitourinary Comments: Positive perineal fissure noted of anterior anal skin between anus and vagina.   Neurological: She is alert and oriented to person, place, and time.  Skin: Skin is warm and dry. No rash noted.  Psychiatric: Affect normal.  Vitals reviewed.  Assessment/Plan: Acute cystitis without hematuria Urinary urgency and frequency. UA reveals positive nitrites. We'll send for culture. We'll empirically treat with Cipro 500 mg twice a day 5 days. Supportive measures reviewed. We'll alter regimen based on culture results.  Yeast vaginitis The white vaginal  discharge and itching most consistent with yeast vaginitis. We'll Rx Diflucan 150 mg. Take 1 tablet by mouth daily. Repeat in 3 days. Supportive measures and over-the-counter medications reviewed with patient.  Screen for STD (sexually transmitted disease) In a monogamous relationship. Denies condom use. Would like routine STI panel. Orders placed.  Perineal fissure in female Mild. Cause of blood on TP. Discussed supportive measures to promote healing. Follow-up if not resolving.    Leeanne Rio, PA-C

## 2016-08-14 NOTE — Assessment & Plan Note (Signed)
In a monogamous relationship. Denies condom use. Would like routine STI panel. Orders placed.

## 2016-08-14 NOTE — Assessment & Plan Note (Signed)
Urinary urgency and frequency. UA reveals positive nitrites. We'll send for culture. We'll empirically treat with Cipro 500 mg twice a day 5 days. Supportive measures reviewed. We'll alter regimen based on culture results.

## 2016-08-14 NOTE — Patient Instructions (Signed)
Your symptoms are consistent with a bladder infection, also called acute cystitis. Please take your antibiotic (Cipro) as directed until all pills are gone.  Stay very well hydrated.  Consider a daily probiotic (Align, Culturelle, or Activia) to help prevent stomach upset caused by the antibiotic.  Taking a probiotic daily may also help prevent recurrent UTIs.  Also consider taking AZO (Phenazopyridine) tablets to help decrease pain with urination.  I will call you with your urine testing results.  We will change antibiotics if indicated.  Call or return to clinic if symptoms are not resolved by completion of antibiotic.   For the yeast infection, take the Diflucan as directed. Start a daily probiotic.  For the tear in the perineal skin, this will heal. Make sure to stay hydrated to keep stools soft. Be gentle with wiping. Soaking in warm water (no bubble bath!) will help promote cleaning the area and healing.   Follow-up if these symptoms are not resolving.  Urinary Tract Infection A urinary tract infection (UTI) can occur any place along the urinary tract. The tract includes the kidneys, ureters, bladder, and urethra. A type of germ called bacteria often causes a UTI. UTIs are often helped with antibiotic medicine.  HOME CARE   If given, take antibiotics as told by your doctor. Finish them even if you start to feel better.  Drink enough fluids to keep your pee (urine) clear or pale yellow.  Avoid tea, drinks with caffeine, and bubbly (carbonated) drinks.  Pee often. Avoid holding your pee in for a long time.  Pee before and after having sex (intercourse).  Wipe from front to back after you poop (bowel movement) if you are a woman. Use each tissue only once. GET HELP RIGHT AWAY IF:   You have back pain.  You have lower belly (abdominal) pain.  You have chills.  You feel sick to your stomach (nauseous).  You throw up (vomit).  Your burning or discomfort with peeing does not go  away.  You have a fever.  Your symptoms are not better in 3 days. MAKE SURE YOU:   Understand these instructions.  Will watch your condition.  Will get help right away if you are not doing well or get worse. Document Released: 06/01/2008 Document Revised: 09/07/2012 Document Reviewed: 07/14/2012 [facility] Patient Information 2015 [place], [place]. This information is not intended to replace advice given to you by your health care provider. Make sure you discuss any questions you have with your health care provider.

## 2016-08-14 NOTE — Assessment & Plan Note (Signed)
The white vaginal discharge and itching most consistent with yeast vaginitis. We'll Rx Diflucan 150 mg. Take 1 tablet by mouth daily. Repeat in 3 days. Supportive measures and over-the-counter medications reviewed with patient.

## 2016-08-14 NOTE — Progress Notes (Signed)
Pre visit review using our clinic review tool, if applicable. No additional management support is needed unless otherwise documented below in the visit note. 

## 2016-08-14 NOTE — Assessment & Plan Note (Signed)
Mild. Cause of blood on TP. Discussed supportive measures to promote healing. Follow-up if not resolving.

## 2016-08-15 LAB — ACUTE HEP PANEL AND HEP B SURFACE AB
HCV AB: NEGATIVE
HEP B S AG: NEGATIVE
Hep A IgM: NONREACTIVE
Hep B C IgM: NONREACTIVE
Hep B S Ab: POSITIVE — AB

## 2016-08-15 LAB — RPR

## 2016-08-15 LAB — HIV ANTIBODY (ROUTINE TESTING W REFLEX): HIV: NONREACTIVE

## 2016-08-17 ENCOUNTER — Encounter: Payer: Self-pay | Admitting: Physician Assistant

## 2016-08-17 LAB — HSV(HERPES SMPLX)ABS-I+II(IGG+IGM)-BLD
HSV 1 GLYCOPROTEIN G AB, IGG: 36.3 {index} — AB (ref <0.90)
Herpes Simplex Vrs I&II-IgM Ab (EIA): 0.5 INDEX

## 2016-08-18 ENCOUNTER — Other Ambulatory Visit: Payer: Self-pay | Admitting: Emergency Medicine

## 2016-08-18 DIAGNOSIS — Z113 Encounter for screening for infections with a predominantly sexual mode of transmission: Secondary | ICD-10-CM

## 2016-08-18 LAB — CERVICOVAGINAL ANCILLARY ONLY: Wet Prep (BD Affirm): POSITIVE — AB

## 2016-08-19 LAB — URINE CULTURE: Colony Count: >=100000

## 2016-08-20 ENCOUNTER — Other Ambulatory Visit: Payer: BLUE CROSS/BLUE SHIELD

## 2016-08-21 ENCOUNTER — Other Ambulatory Visit: Payer: Self-pay | Admitting: *Deleted

## 2016-08-21 MED ORDER — CEFDINIR 300 MG PO CAPS: 300.0000 mg | ORAL_CAPSULE | Freq: Two times a day (BID) | ORAL | 0 refills | Status: DC

## 2016-09-17 ENCOUNTER — Telehealth: Payer: Self-pay | Admitting: Physician Assistant

## 2016-09-17 MED ORDER — FLUCONAZOLE 150 MG PO TABS: ORAL_TABLET | ORAL | 0 refills | Status: DC

## 2016-09-17 NOTE — Telephone Encounter (Signed)
Caller name: Larkyn Relation to pt: self Call back number: [phone] [facility] [address] [place]  Reason for call: Pt states was taken antibiotic cefdinir (OMNICEF) 300 MG capsule and pt mentioned that she finished her antibiotic and that she is starting a yeast infection and needs meds for the yeast infection sent to her pharmacy. Pt stated PCP already has knowledge about the situation and informed her to call office if not well to get meds. Please advise.

## 2016-09-17 NOTE — Telephone Encounter (Signed)
rx sent in and patient informed of provider information.

## 2016-09-17 NOTE — Telephone Encounter (Signed)
She should have completed antibiotics almost a month ago. Should not be the cause of a yeast infection developing now. I do know she has history of yeast infections, so I am ok with sending her in an Rx Diflucan 150 mg -- take 1 tablet by mouth. Quantity 1 with 0 refills. If symptoms are not resolving, she will need to be seen in office.

## 2016-09-22 ENCOUNTER — Other Ambulatory Visit: Payer: Self-pay | Admitting: Physician Assistant

## 2016-09-22 NOTE — Telephone Encounter (Signed)
Rx request to pharmacy/SLS  

## 2016-10-26 ENCOUNTER — Encounter: Payer: Self-pay | Admitting: Physician Assistant

## 2016-10-26 ENCOUNTER — Ambulatory Visit (INDEPENDENT_AMBULATORY_CARE_PROVIDER_SITE_OTHER): Payer: Self-pay | Admitting: Physician Assistant

## 2016-10-26 VITALS — BP 118/78 | HR 77 | Temp 97.8°F | Resp 16 | Ht <= 58 in | Wt 153.0 lb

## 2016-10-26 DIAGNOSIS — R5383 Other fatigue: Secondary | ICD-10-CM

## 2016-10-26 DIAGNOSIS — M2560 Stiffness of unspecified joint, not elsewhere classified: Secondary | ICD-10-CM

## 2016-10-26 LAB — SEDIMENTATION RATE: SED RATE: 41 mm/h — AB (ref 0–20)

## 2016-10-26 LAB — BASIC METABOLIC PANEL
BUN: 11 mg/dL (ref 6–23)
CHLORIDE: 105 meq/L (ref 96–112)
CO2: 25 mEq/L (ref 19–32)
Calcium: 9.5 mg/dL (ref 8.4–10.5)
Creatinine, Ser: 0.66 mg/dL (ref 0.40–1.20)
GFR: 135.47 mL/min (ref >60.00)
Glucose, Bld: 90 mg/dL (ref 70–99)
POTASSIUM: 4.2 meq/L (ref 3.5–5.1)
Sodium: 138 mEq/L (ref 135–145)

## 2016-10-26 LAB — VITAMIN B12: VITAMIN B 12: 566 pg/mL (ref 211–911)

## 2016-10-26 LAB — CBC
HCT: 36 % (ref 36.0–46.0)
HEMOGLOBIN: 11.6 g/dL — AB (ref 12.0–15.0)
MCHC: 32.3 g/dL (ref 30.0–36.0)
MCV: 77.4 fl — ABNORMAL LOW (ref 78.0–100.0)
Platelets: 428 10*3/uL — ABNORMAL HIGH (ref 150.0–400.0)
RBC: 4.66 Mil/uL (ref 3.87–5.11)
RDW: 15.8 % — AB (ref 11.5–15.5)
WBC: 7.3 10*3/uL (ref 4.0–10.5)

## 2016-10-26 LAB — T4, FREE: FREE T4: 0.68 ng/dL (ref 0.60–1.60)

## 2016-10-26 LAB — TSH: TSH: 0.56 u[IU]/mL (ref 0.35–4.50)

## 2016-10-26 NOTE — Progress Notes (Signed)
 Patient presents to clinic today c/o significant fatigue first noted 4 weeks ago. Denies change in diet or exercise. Is not regularly participating in exercise at present. Denies change in stress. Denies depressed mood, anxiety or anhedonia. Is staying well hydrated. Notes she is sleeping well, averaging about 8 hours per night. Endorses she wakes up and carries her son to school each morning around 7:30. Notes she comes home and would normally work on the house until time for work at 9:30. Now notes coming home and napping until work. Is able to get through work but with residual fatigue. Of note, patient with previous abnormal TSH. On further testing with T4 and repeat TSH, everything was normal. Patient with history of sickle cell trait. Denies history of anemia of any kind.  Patient also endorses stiffness of hands with aching pain bilaterally over the past few months. Has worsened with cold weather. Stiffness can last > 1 hour some times. Denies locking of digits. Denies trauma or injury. Denies numbness or tingling. Endorses one episode of fingers turning blue when cold. No recurrent episodes of this. Patient also notes occasional aching in her toes of feet bilaterally. Has not really taken anything for her symptoms.  Past Medical History:  Diagnosis Date  . Asthma    prn inhaler  . Carpal tunnel syndrome of right wrist 02/2015  . De Quervain's tenosynovitis, right 02/2015  . Migraines   . Sickle cell trait (HCC)     Current Outpatient Prescriptions on File Prior to Visit  Medication Sig Dispense Refill  . albuterol (PROVENTIL HFA;VENTOLIN HFA) 108 (90 Base) MCG/ACT inhaler Inhale 2 puffs into the lungs every 6 (six) hours as needed. For wheeze or shortness of breath 1 Inhaler 5  . eletriptan (RELPAX) 40 MG tablet May repeat dose once in 2 hours if headache persists or recurs. 10 tablet 3  . fluticasone (FLONASE) 50 MCG/ACT nasal spray Place 2 sprays into both nostrils daily. 16 g 6  .  metoprolol (LOPRESSOR) 50 MG tablet TAKE 1 TABLET (50 MG TOTAL) BY MOUTH 2 (TWO) TIMES DAILY. 60 tablet 0  . ondansetron (ZOFRAN) 4 MG tablet Take 1 tablet (4 mg total) by mouth every 8 (eight) hours as needed for nausea or vomiting. 20 tablet 0   No current facility-administered medications on file prior to visit.     No Known Allergies  Family History  Problem Relation Age of Onset  . Hypertension Mother   . Asthma Father   . Hypertension Father   . Asthma Brother   . Asthma Paternal Aunt   . Diabetes Paternal Aunt   . Cancer Maternal Grandfather     oral cancer  . Diabetes Maternal Grandfather   . Hypertension Maternal Grandfather     Social History   Social History  . Marital status: Single    Spouse name: N/A  . Number of children: 1  . Years of education: 12   Occupational History  . sales [employer]   Social History Main Topics  . Smoking status: Never Smoker  . Smokeless tobacco: Never Used  . Alcohol use Yes     Comment: occasionally  . Drug use: No  . Sexual activity: Yes    Birth control/ protection: None   Other Topics Concern  . None   Social History Narrative   Lives with son (2010), employeed at [employer] part-time. Completed some college classes.    Review of Systems - See HPI.  All other ROS are negative.    BP 118/78 (BP Location: Left Arm, Patient Position: Sitting, Cuff Size: Normal)   Pulse 77   Temp 97.8 F (36.6 C) (Oral)   Resp 16   Ht 4' 10" (1.473 m)   Wt 153 lb (69.4 kg)   LMP 10/18/2016 Comment: Menstrual was only 2 days.  SpO2 99%   BMI 31.98 kg/m   Physical Exam  Constitutional: She is oriented to person, place, and time and well-developed, well-nourished, and in no distress.  HENT:  Head: Normocephalic and atraumatic.  Eyes: Conjunctivae are normal. Pupils are equal, round, and reactive to light.  Neck: Neck supple. No thyromegaly present.  Cardiovascular: Normal rate, regular rhythm, normal heart sounds and intact distal  pulses.   Pulses:      Radial pulses are 2+ on the right side, and 2+ on the left side.  No peripheral cyanosis, pallor or coolness noted. Hands placed under cold water for a minute without noted changes.  Pulmonary/Chest: Effort normal.  Musculoskeletal:       Right hand: Normal. Normal sensation noted. Normal strength noted.       Left hand: Normal. Normal sensation noted. Normal strength noted.  Proximal and distal upper extremities bilaterally are neurovascularly intact.  Neurological: She is alert and oriented to person, place, and time. No cranial nerve deficit.  Skin: Skin is warm and dry. No rash noted.  Psychiatric: Affect normal.  Vitals reviewed.   Recent Results (from the past 2160 hour(s))  Cervicovaginal ancillary only     Status: Abnormal   Collection Time: 08/14/16 12:00 AM  Result Value Ref Range   Wet Prep (BD Affirm) **POSITIVE for Candida** (A)     Comment: Normal Reference Range - Negative  POCT Urinalysis Dipstick (Automated)     Status: Abnormal   Collection Time: 08/14/16  3:51 PM  Result Value Ref Range   Color, UA Yellow    Clarity, UA Clear    Glucose, UA negative    Bilirubin, UA negative    Ketones, UA negative    Spec Grav, UA >=1.030    Blood, UA negative    pH, UA 5.5    Protein, UA negative    Urobilinogen, UA negative    Nitrite, UA positive    Leukocytes, UA Negative Negative  Urine Culture     Status: None   Collection Time: 08/14/16  4:09 PM  Result Value Ref Range   Culture ESCHERICHIA COLI    Colony Count >=100,000 COLONIES/ML    Organism ID, Bacteria ESCHERICHIA COLI       Susceptibility   Escherichia coli -  (no method available)    AMPICILLIN  Resistant     AMOX/CLAVULANIC 4 Sensitive     AMPICILLIN/SULBACTAM 4 Sensitive     PIP/TAZO <=4 Sensitive     IMIPENEM <=0.25 Sensitive     CEFAZOLIN <=4 Not Reportable     CEFTRIAXONE <=1 Sensitive     CEFTAZIDIME <=1 Sensitive     CEFEPIME <=1 Sensitive     GENTAMICIN <=1 Sensitive      TOBRAMYCIN <=1 Sensitive     CIPROFLOXACIN >=4 Resistant     LEVOFLOXACIN >=8 Resistant     NITROFURANTOIN <=16 Sensitive     TRIMETH/SULFA* <=20 Sensitive      * NR=NOT REPORTABLE,SEE COMMENTORAL therapy:A cefazolin MIC of <32 predicts susceptibility to the oral agents cefaclor,cefdinir,cefpodoxime,cefprozil,cefuroxime,cephalexin,and loracarbef when used for therapy of uncomplicated UTIs due to E.coli,K.pneumomiae,and P.mirabilis. PARENTERAL therapy: A cefazolinMIC of >8 indicates resistance to parenteralcefazolin. An alternate test   method must beperformed to confirm susceptibility to parenteralcefazolin.  Acute Hep Panel & Hep B Surface Ab     Status: Abnormal   Collection Time: 08/14/16  4:09 PM  Result Value Ref Range   Hepatitis B Surface Ag NEGATIVE NEGATIVE   Hep B C IgM NON REACTIVE NON REACTIVE    Comment: High levels of Hepatitis B Core IgM antibody are detectable during the acute stage of Hepatitis B. This antibody is used to differentiate current from past HBV infection.      Hep B S Ab POS (A) NEGATIVE   Hep A IgM NON REACTIVE NON REACTIVE   HCV Ab NEGATIVE NEGATIVE  RPR     Status: None   Collection Time: 08/14/16  4:09 PM  Result Value Ref Range   RPR Ser Ql NON REAC NON REAC  HIV antibody     Status: None   Collection Time: 08/14/16  4:09 PM  Result Value Ref Range   HIV 1&2 Ab, 4th Generation NONREACTIVE NONREACTIVE    Comment:   HIV-1 antigen and HIV-1/HIV-2 antibodies were not detected.  There is no laboratory evidence of HIV infection.   HIV-1/2 Antibody Diff        Not indicated. HIV-1 RNA, Qual TMA          Not indicated.     PLEASE NOTE: This information has been disclosed to you from records whose confidentiality may be protected by state law. If your state requires such protection, then the state law prohibits you from making any further disclosure of the information without the specific written consent of the person to whom it pertains, or as  otherwise permitted by law. A general authorization for the release of medical or other information is NOT sufficient for this purpose.   The performance of this assay has not been clinically validated in patients less than 66 years old.   For additional information please refer to http://education.quest[facility].com/faq/FAQ106.  (This link is being provided for informational/educational purposes only.)     HSV(herpes smplx)abs-1+2(IgG+IgM)-bld     Status: Abnormal   Collection Time: 08/14/16  4:09 PM  Result Value Ref Range   HSV 1 Glycoprotein G Ab, IgG 36.30 (H) <0.90 Index    Comment:                      Index             Interpretation                    =====             ==============                    < 0.90               NEGATIVE                    0.90 - 1.09          EQUIVOCAL                    > 1.09               POSITIVE    HSV 2 Glycoprotein G Ab, IgG <0.90 <0.90 Index    Comment:                      Index  Interpretation                    =====             ==============                    < 0.90               NEGATIVE                    0.90 - 1.09          EQUIVOCAL                    > 1.09               POSITIVE This assay utilizes recombinant type-specific antigens to differentiate HSV-1 from HSV-2 infections. A positive result cannot distinguish between recent and past infection. If recent HSV infection is suspected but the results are negative or equivocal, the assay should be repeated in 4-6 weeks. The performance characteristics of the assay have not been established for pediatric populations, immunocompromised patients, or neonatal screening.      Herpes Simplex Vrs I&II-IgM Ab (EIA) 0.50 INDEX    Comment:                 <=0.90 . . . . . . Marland Kitchen NEGATIVE               0.91-1.09. . . . . Marland Kitchen EQUIVOCAL               >=1.10 . . . . . . Marland Kitchen POSITIVE                                                                                                    The results obtained with the HSV 1 & 2 IgM test should serve only as an aid to diagnosis and should not be interpreted as diagnostic by themselves. Heterotypic IgM antibody responses may occur in patients with a history of infection with other Herpes viruses, including Epstein-Barr virus and Varicella zoster virus, and give false positive results in HSV 1 & 2 IgM tests.  This test does not distinguish between HSV 1 and HSV 2.  A positive HSV IgM may indicate primary infection, but IgM antibody can persist 12 or more months after primary infection.  For confirmation, if clinically indicated, positive IgM results could be followed with the test for HSV 1 & 2 IgG glycoprotein (test code [id number]) in 4-6 weeks.     Assessment/Plan: 1. Fatigue, unspecified type Will obtain lab assessment today to further evaluate. There is no abnormal finding on examination today. ESR added due to joint complaints. Supportive measures reviewed with patient. She is to work on increased aerobic exercise. - CBC - TSH - T4, free - Vitamin D 1,25 dihydroxy - W88 - Basic metabolic panel - Sed Rate (ESR)  2. Joint stiffness With aching pain. No abnormal findings on exam. One episode described by patient that seems consistent with mild Raynaud. No change  in extremity when placed under very cold water. Supportive measures reviewed. Aleve as directed. Will obtain ESR to further assess.  - Sed Rate (ESR)   Martin, William Cody, PA-C 

## 2016-10-26 NOTE — Patient Instructions (Signed)
Please go to the lab for blood work. I will call you with your results. Please increase aerobic exercise. Eat a well-balanced diet and stay well hydrated. Keep hands and feet warm.  We will alter regimen based on your results.

## 2016-10-27 ENCOUNTER — Telehealth: Payer: Self-pay | Admitting: *Deleted

## 2016-10-27 ENCOUNTER — Other Ambulatory Visit (INDEPENDENT_AMBULATORY_CARE_PROVIDER_SITE_OTHER): Payer: Self-pay

## 2016-10-27 DIAGNOSIS — D649 Anemia, unspecified: Secondary | ICD-10-CM

## 2016-10-27 LAB — FERRITIN: FERRITIN: 17.1 ng/mL (ref 10.0–291.0)

## 2016-10-27 NOTE — Telephone Encounter (Signed)
-----   Message from Brunetta Jeans, PA-C sent at 10/26/2016  8:14 PM EDT ----- Lab results so far reveal normal thyroid function and Vitamin B12 level. Mild anemia noted with signs could be related to iron deficiency. Would like to see if we can add-on ferritin level. If not patient will need to return for lab drawn. Also her sed rate is elevated. Would like to have her set up with Rheumatology giving her joint stiffness and pain associated with fatigue. Vitamin D pending. Will call once resulted.

## 2016-10-27 NOTE — Telephone Encounter (Signed)
Add-on lab form completed and faxed to [phone], confirmation received/SLS 10/31

## 2016-10-28 ENCOUNTER — Other Ambulatory Visit: Payer: Self-pay | Admitting: *Deleted

## 2016-10-28 DIAGNOSIS — R700 Elevated erythrocyte sedimentation rate: Secondary | ICD-10-CM

## 2016-10-28 DIAGNOSIS — M2560 Stiffness of unspecified joint, not elsewhere classified: Secondary | ICD-10-CM

## 2016-10-29 LAB — VITAMIN D 1,25 DIHYDROXY
Vitamin D 1, 25 (OH)2 Total: 46 pg/mL (ref 18–72)
Vitamin D2 1, 25 (OH)2: <8 pg/mL
Vitamin D3 1, 25 (OH)2: 46 pg/mL

## 2016-12-01 ENCOUNTER — Ambulatory Visit (INDEPENDENT_AMBULATORY_CARE_PROVIDER_SITE_OTHER): Payer: PRIVATE HEALTH INSURANCE | Admitting: Physician Assistant

## 2016-12-01 ENCOUNTER — Ambulatory Visit (INDEPENDENT_AMBULATORY_CARE_PROVIDER_SITE_OTHER): Payer: PRIVATE HEALTH INSURANCE

## 2016-12-01 VITALS — BP 146/96 | HR 82 | Temp 98.2°F | Resp 20 | Ht <= 58 in | Wt 151.8 lb

## 2016-12-01 DIAGNOSIS — R0682 Tachypnea, not elsewhere classified: Secondary | ICD-10-CM

## 2016-12-01 DIAGNOSIS — R8290 Unspecified abnormal findings in urine: Secondary | ICD-10-CM

## 2016-12-01 DIAGNOSIS — R0789 Other chest pain: Secondary | ICD-10-CM

## 2016-12-01 DIAGNOSIS — R079 Chest pain, unspecified: Secondary | ICD-10-CM

## 2016-12-01 LAB — POCT CBC
GRANULOCYTE PERCENT: 70.4 % (ref 37–80)
HEMATOCRIT: 34.6 % — AB (ref 37.7–47.9)
Hemoglobin: 11.6 g/dL — AB (ref 12.2–16.2)
LYMPH, POC: 2 (ref 0.6–3.4)
MCH, POC: 25.4 pg — AB (ref 27–31.2)
MCHC: 33.6 g/dL (ref 31.8–35.4)
MCV: 75.6 fL — AB (ref 80–97)
MID (CBC): 0.3 (ref 0–0.9)
MPV: 7.1 fL (ref 0–99.8)
POC GRANULOCYTE: 5.4 (ref 2–6.9)
POC LYMPH %: 26 % (ref 10–50)
POC MID %: 3.6 % (ref 0–12)
Platelet Count, POC: 387 10*3/uL (ref 142–424)
RBC: 4.58 M/uL (ref 4.04–5.48)
RDW, POC: 15.2 %
WBC: 7.7 10*3/uL (ref 4.6–10.2)

## 2016-12-01 LAB — POCT URINALYSIS DIP (MANUAL ENTRY)
BILIRUBIN UA: NEGATIVE
Bilirubin, UA: NEGATIVE
GLUCOSE UA: NEGATIVE
Leukocytes, UA: NEGATIVE
Nitrite, UA: POSITIVE — AB
Protein Ur, POC: NEGATIVE
RBC UA: NEGATIVE
SPEC GRAV UA: 1.015
Urobilinogen, UA: 0.2
pH, UA: 5.5

## 2016-12-01 LAB — POC MICROSCOPIC URINALYSIS (UMFC)

## 2016-12-01 LAB — POCT URINE PREGNANCY: Preg Test, Ur: NEGATIVE

## 2016-12-01 MED ORDER — NAPROXEN 500 MG PO TABS: 500.0000 mg | ORAL_TABLET | Freq: Two times a day (BID) | ORAL | 0 refills | Status: DC

## 2016-12-01 MED ORDER — ALPRAZOLAM 0.25 MG PO TABS
0.2500 mg | ORAL_TABLET | Freq: Once | ORAL | Status: AC
  Administered 2016-12-01: 0.25 mg via ORAL

## 2016-12-01 MED ORDER — KETOROLAC TROMETHAMINE 60 MG/2ML IM SOLN
60.0000 mg | Freq: Once | INTRAMUSCULAR | Status: AC
  Administered 2016-12-01: 60 mg via INTRAMUSCULAR

## 2016-12-01 MED ORDER — CIPROFLOXACIN HCL 500 MG PO TABS: 500.0000 mg | ORAL_TABLET | Freq: Two times a day (BID) | ORAL | 0 refills | Status: DC

## 2016-12-01 NOTE — Progress Notes (Signed)
12/01/2016 6:03 PM   DOB: 1986/05/06 / MRN: [id number]  SUBJECTIVE:  Kimberly Guerra is a 30 y.o. female presenting for chest pain.  States that this started the morning and describes the pain as a pressure in the middle to right side and says "it feels like a bunch of bricks sitting my chest."  It is not getting worse or better.  She associates SOB. She says he is a never smoker and denies the usage of OCP's.   She has tried using an inhaler without relief of her symptoms. She has a 7 year history of asthma.    She has No Known Allergies.   She  has a past medical history of Asthma; Carpal tunnel syndrome of right wrist (02/2015); De Quervain's tenosynovitis, right (02/2015); Migraines; and Sickle cell trait ([place]).    She  reports that she has never smoked. She has never used smokeless tobacco. She reports that she drinks alcohol. She reports that she does not use drugs. She  reports that she currently engages in sexual activity. She reports using the following method of birth control/protection: None. The patient  has a past surgical history that includes Cesarean section (01/02/2009); Dorsal compartment release (Right, 03/25/2015); and Carpal tunnel release (Right, 03/25/2015).  Her family history includes Asthma in her brother, father, and paternal aunt; Cancer in her maternal grandfather; Diabetes in her maternal grandfather and paternal aunt; Hypertension in her father, maternal grandfather, and mother.  Review of Systems  Constitutional: Negative for chills and fever.  Respiratory: Positive for shortness of breath.   Cardiovascular: Positive for chest pain. Negative for leg swelling.  Gastrointestinal: Negative for nausea and vomiting.  Skin: Negative for itching and rash.  Neurological: Negative for dizziness.    The problem list and medications were reviewed and updated by myself where necessary and exist elsewhere in the encounter.   OBJECTIVE:  BP (!) 146/96 (BP Location: Right  Arm, Patient Position: Sitting, Cuff Size: Large)   Pulse 82   Temp 98.2 F (36.8 C) (Oral)   Resp 20   Ht 4\' 9"  (1.448 m)   Wt 151 lb 12.8 oz (68.9 kg)   SpO2 100%   BMI 32.85 kg/m   Physical Exam  Constitutional: She is oriented to person, place, and time. She appears well-developed and well-nourished. No distress.  Cardiovascular: Normal rate, regular rhythm, normal heart sounds and intact distal pulses.  Exam reveals no gallop and no friction rub.   No murmur heard. Pulmonary/Chest: Effort normal and breath sounds normal. She exhibits tenderness (generalized, worse with inspiration). She exhibits no bony tenderness.  Musculoskeletal: Normal range of motion.  Neurological: She is alert and oriented to person, place, and time. She has normal reflexes. She displays normal reflexes. No cranial nerve deficit. She exhibits normal muscle tone. Coordination normal.  Skin: Skin is warm and dry. She is not diaphoretic.  Psychiatric: She has a normal mood and affect.    Results for orders placed or performed in visit on 12/01/16 (from the past 72 hour(s))  POCT urinalysis dipstick     Status: Abnormal   Collection Time: 12/01/16  5:08 PM  Result Value Ref Range   Color, UA yellow yellow   Clarity, UA cloudy (A) clear   Glucose, UA negative negative   Bilirubin, UA negative negative   Ketones, POC UA negative negative   Spec Grav, UA 1.015    Blood, UA negative negative   pH, UA 5.5    Protein Ur, POC negative  negative   Urobilinogen, UA 0.2    Nitrite, UA Positive (A) Negative   Leukocytes, UA Negative Negative  POCT urine pregnancy     Status: None   Collection Time: 12/01/16  5:08 PM  Result Value Ref Range   Preg Test, Ur Negative Negative  POCT CBC     Status: Abnormal   Collection Time: 12/01/16  5:09 PM  Result Value Ref Range   WBC 7.7 4.6 - 10.2 K/uL   Lymph, poc 2.0 0.6 - 3.4   POC LYMPH PERCENT 26.0 10 - 50 %L   MID (cbc) 0.3 0 - 0.9   POC MID % 3.6 0 - 12 %M    POC Granulocyte 5.4 2 - 6.9   Granulocyte percent 70.4 37 - 80 %G   RBC 4.58 4.04 - 5.48 M/uL   Hemoglobin 11.6 (A) 12.2 - 16.2 g/dL   HCT, POC 34.6 (A) 37.7 - 47.9 %   MCV 75.6 (A) 80 - 97 fL   MCH, POC 25.4 (A) 27 - 31.2 pg   MCHC 33.6 31.8 - 35.4 g/dL   RDW, POC 15.2 %   Platelet Count, POC 387 142 - 424 K/uL   MPV 7.1 0 - 99.8 fL  POCT Microscopic Urinalysis ([facility])     Status: Abnormal   Collection Time: 12/01/16  5:50 PM  Result Value Ref Range   WBC,UR,HPF,POC Few (A) None WBC/hpf   RBC,UR,HPF,POC None None RBC/hpf   Bacteria Moderate (A) None, Too numerous to count   Mucus Present (A) Absent   Epithelial Cells, UR Per Microscopy Few (A) None, Too numerous to count cells/hpf   EKG: NSR without evidence of hypertrophy, ischemia, and infarction.   Dg Chest 2 View  Result Date: 12/01/2016 CLINICAL DATA:  Chest pain and shortness of breath began this morning. History of sickle cell trait, asthma, nonsmoker. EXAM: CHEST  2 VIEW COMPARISON:  None in PACs FINDINGS: The lungs are well-expanded. There is no focal infiltrate. There is no pleural effusion. The heart and pulmonary vascularity are normal. The mediastinum is normal in width. There is no pleural effusion. The bony thorax is unremarkable. IMPRESSION: There is no active cardiopulmonary disease. Electronically Signed   By: David  Martinique M.D.   On: 12/01/2016 16:52    ASSESSMENT AND PLAN  Kimberly Guerra was seen today for chest pain and shortness of breath.  Diagnoses and all orders for this visit:  Chest pain, unspecified type Comments: Most likely pleurisy. EKG and chest rads reassuring.  Her pain has resolved with 60 of Toradol and 0.25 ALPRAZOLAM. She has no risk factors for CAD and is very young.  No DVT risk factors or exam findings that are concerning.  She does have a UTI and will treat to that end. She has agreed to go directly to the ED if this symptoms return.  Orders: -     EKG 12-Lead -     POCT CBC -     ketorolac  (TORADOL) injection 60 mg; Inject 2 mLs (60 mg total) into the muscle once.  Tachypnea -     POCT urinalysis dipstick -     ALPRAZolam (XANAX) tablet 0.25 mg; Take 1 tablet (0.25 mg total) by mouth once. -     POCT urine pregnancy  Chest wall tenderness -     DG Chest 2 View; Future  Urine abnormality -     POCT Microscopic Urinalysis ([facility]) -     Urine culture  Other orders -  ciprofloxacin (CIPRO) 500 MG tablet; Take 1 tablet (500 mg total) by mouth 2 (two) times daily. -     naproxen (NAPROSYN) 500 MG tablet; Take 1 tablet (500 mg total) by mouth 2 (two) times daily with a meal.    The patient is advised to call or return to clinic if she does not see an improvement in symptoms, or to seek the care of the closest emergency department if she worsens with the above plan.   Philis Fendt, MHS, PA-C [facility] 12/01/2016 6:03 PM

## 2016-12-01 NOTE — Patient Instructions (Addendum)
If your chest pain and shortness of breath returns this please go directly to the ED.      IF you received an x-ray today, you will receive an invoice from [facility]. Please contact [facility] at [phone] with questions or concerns regarding your invoice.   IF you received labwork today, you will receive an invoice from [employer]. Please contact [facility] at [phone] with questions or concerns regarding your invoice.   Our billing staff will not be able to assist you with questions regarding bills from these companies.  You will be contacted with the lab results as soon as they are available. The fastest way to get your results is to activate your My Chart account. Instructions are located on the last page of this paperwork. If you have not heard from [place] regarding the results in 2 weeks, please contact this office.

## 2016-12-03 LAB — URINE CULTURE

## 2016-12-28 DIAGNOSIS — N809 Endometriosis, unspecified: Secondary | ICD-10-CM

## 2016-12-28 DIAGNOSIS — E282 Polycystic ovarian syndrome: Secondary | ICD-10-CM

## 2016-12-28 HISTORY — DX: Polycystic ovarian syndrome: E28.2

## 2016-12-28 HISTORY — DX: Endometriosis, unspecified: N80.9

## 2017-04-21 ENCOUNTER — Other Ambulatory Visit (HOSPITAL_COMMUNITY)
Admission: RE | Admit: 2017-04-21 | Discharge: 2017-04-21 | Disposition: A | Discharge status: Home / Self Care | Payer: 59 | Source: Ambulatory Visit | Attending: Obstetrics and Gynecology | Admitting: Obstetrics and Gynecology

## 2017-04-21 ENCOUNTER — Other Ambulatory Visit: Payer: Self-pay | Admitting: Obstetrics and Gynecology

## 2017-04-21 DIAGNOSIS — Z1151 Encounter for screening for human papillomavirus (HPV): Secondary | ICD-10-CM | POA: Diagnosis not present

## 2017-04-21 DIAGNOSIS — Z01419 Encounter for gynecological examination (general) (routine) without abnormal findings: Secondary | ICD-10-CM | POA: Diagnosis present

## 2017-04-28 LAB — CYTOLOGY - PAP
Diagnosis: NEGATIVE
HPV (WINDOPATH): NOT DETECTED

## 2017-06-14 NOTE — Patient Instructions (Addendum)
Your procedure is scheduled on:  Wednesday, June 27  Enter through the [employer] [facility] at: 8:30 am  Pick up the phone at the desk and dial [phone].  Call this number if you have problems the morning of surgery: [phone].  Remember: Do NOT eat or drink clear liquids (including water) after midnight Tuesday  Take these medicines the morning of surgery with a SIP OF WATER:   Metoprolol and flonase nasal spray if needed  Bring albuterol inhaler with you on day of surgery.  Do Not smoke on the day of surgery.  Stop herbal medications and supplements at this time.  Do NOT wear jewelry (body piercing), metal hair clips/bobby pins, make-up, or nail polish. Do NOT wear lotions, powders, or perfumes.  You may wear deoderant. Do NOT shave for 48 hours prior to surgery. Do NOT bring valuables to the hospital. Contacts may not be worn into surgery.  Have a responsible adult drive you home and stay with you for 24 hours after your procedure.  Home with Mother Kimberly Guerra cell [phone] or boyfriend Kimberly Guerra cell [phone].

## 2017-06-15 ENCOUNTER — Encounter (HOSPITAL_COMMUNITY)
Admission: RE | Admit: 2017-06-15 | Discharge: 2017-06-15 | Disposition: A | Discharge status: Home / Self Care | Payer: 59 | Source: Ambulatory Visit | Attending: Obstetrics and Gynecology | Admitting: Obstetrics and Gynecology

## 2017-06-15 ENCOUNTER — Encounter (HOSPITAL_COMMUNITY): Payer: Self-pay

## 2017-06-15 DIAGNOSIS — N921 Excessive and frequent menstruation with irregular cycle: Secondary | ICD-10-CM | POA: Diagnosis present

## 2017-06-15 HISTORY — DX: Anemia, unspecified: D64.9

## 2017-06-15 HISTORY — DX: Other seasonal allergic rhinitis: J30.2

## 2017-06-15 LAB — CBC
HEMATOCRIT: 34.7 % — AB (ref 36.0–46.0)
HEMOGLOBIN: 11.4 g/dL — AB (ref 12.0–15.0)
MCH: 24.7 pg — ABNORMAL LOW (ref 26.0–34.0)
MCHC: 32.9 g/dL (ref 30.0–36.0)
MCV: 75.1 fL — ABNORMAL LOW (ref 78.0–100.0)
Platelets: 402 10*3/uL — ABNORMAL HIGH (ref 150–400)
RBC: 4.62 MIL/uL (ref 3.87–5.11)
RDW: 15.6 % — ABNORMAL HIGH (ref 11.5–15.5)
WBC: 9.3 10*3/uL (ref 4.0–10.5)

## 2017-06-15 LAB — BASIC METABOLIC PANEL
ANION GAP: 6 (ref 5–15)
BUN: 10 mg/dL (ref 6–20)
CO2: 29 mmol/L (ref 22–32)
Calcium: 9.3 mg/dL (ref 8.9–10.3)
Chloride: 104 mmol/L (ref 101–111)
Creatinine, Ser: 0.8 mg/dL (ref 0.44–1.00)
GFR calc Af Amer: >60 mL/min (ref >60)
GFR calc non Af Amer: >60 mL/min (ref >60)
GLUCOSE: 111 mg/dL — AB (ref 65–99)
POTASSIUM: 4 mmol/L (ref 3.5–5.1)
Sodium: 139 mmol/L (ref 135–145)

## 2017-06-17 ENCOUNTER — Encounter (HOSPITAL_COMMUNITY): Payer: Self-pay | Admitting: *Deleted

## 2017-06-18 ENCOUNTER — Encounter (HOSPITAL_COMMUNITY): Payer: Self-pay | Admitting: *Deleted

## 2017-06-21 ENCOUNTER — Encounter (HOSPITAL_BASED_OUTPATIENT_CLINIC_OR_DEPARTMENT_OTHER): Payer: Self-pay | Admitting: *Deleted

## 2017-06-21 NOTE — H&P (Signed)
   History of Present Illness  General:  Pt presents for preop for hyst/D&C due to menorrhagia. On ultrasound, endometrium was thickened but not well visualized. Last 2 menses have been horribile. Cramps are terrible helps with Motrin. Pt denies n/v with menses., Lysteda has prolonged bleeding. first few days are still pretty heavy even with the medicine. Sex is painful now too.   Current Medications  Taking   Lysteda(Tranexamic Acid) 650 MG Tablet 2 tablets Orally TID up to 5 days with menses   Integra 62.5-62.5-40-3 MG Capsule 1 capsule Orally Once a day   Ibuprofen 800 MG Tablet 1 tablet Orally every 8 hrs x 24 hours prior to menses then q 8 hours prn pain, Notes: prn   Not-Taking   Bactrim DS(Sulfamethoxazole-TMP DS) 800-160 MG Tablet 1 tablet Orally Twice a day   Metoprolol Succinate ER 50 MG Tablet Extended Release 24 Hour 1 tablet Orally Once a day, Notes: not currently taking   Medication List reviewed and reconciled with the patient    Past Medical History  H/o Chronic Migraines-Dr. Elyn Aquas pcp.   Hives-etiology unknown.           Surgical History  foot surgery   carpel tunnel surgery    Family History  Father: alive, diagnosed with Hypertension  Mother: alive, diagnosed with Hypertension  Maternal GGM breast cancer.   Social History  General:  Tobacco use  cigarettes: Never smoked Tobacco history last updated 06/15/2017 Alcohol: yes, occasionally, wine.  no Recreational drug use.  Exercise: yes, walks.  Marital Status: committed relationship.  Children: Boys, 1.  OCCUPATION: Works for a [employer].    Gyn History  Sexual activity currently sexually active.  Periods : every month but coming at differnt times of the month.  LMP 06/10/17.  Denies H/O Birth control.  Last pap smear date 04/21/17, all negative.  Denies H/O Last mammogram date.  Denies H/O Abnormal pap smear.  Denies H/O STD.    OB History  Number of pregnancies 3.  Pregnancy # 1  abortion.  Pregnancy # 2 live birth, C-section, boy.  Pregnancy # 3 abortion.    Allergies  N.K.D.A.   Hospitalization/Major Diagnostic Procedure  childbirth x 1   Ovarian cyst ruptured age 45   Review of Systems  Denies fever/chills, chest pain, SOB, headaches, numbness/tingling. No h/o complication with anesthesia, bleeding disorders or blood clots.   Vital Signs  Wt 158, Wt change -1 lb, Ht 59, BMI 31.91, Pulse sitting 76, BP sitting 116/83.   Physical Examination  GENERAL:  Patient appears alert and oriented.  General Appearance: well-appearing, well-developed, no acute distress.  Speech: clear.  LUNGS:  Auscultation: no wheezing/rhonchi/rales. CTA bilaterally.  HEART:  Heart sounds: normal. RRR. no murmur.  ABDOMEN:  General: soft nontender, nondistended, no masses.  FEMALE GENITOURINARY:  Pelvic Blood at perinuem, thighs. No active bleeding, uterus tender to palpation. Cervix deviated to pt's right.  EXTREMITIES:  General: No edema or calf tenderness.     Assessments   1. Pre-operative clearance - Z01.818 (Primary)   2. Menorrhagia with regular cycle - N92.0   3. Dysmenorrhea - N94.6   Treatment  1. Dysmenorrhea  Notes: Discontinue Motrin 5 days prior to procedure.  Referral KA:[facility] Reason:Add on Diagnostic Laparoscopy and possible chromotubation to D&C on 6/27. Will need RNFA.    Follow Up  2 Weeks post op

## 2017-06-21 NOTE — Progress Notes (Signed)
NPO AFTER MN.  ARRIVE AT 0630.  CURRENT LAB RESULTS IN CHART AND EPIC.  WILL TAKE METOPROLOL AM DOS W/ SIPS OF WATER.  PER PT NO CHANGES IN HEALTH HX / MEDICATIONS SINCE COMPLETED ON 06-04-2017.

## 2017-06-23 NOTE — Anesthesia Preprocedure Evaluation (Addendum)
Anesthesia Evaluation  Patient identified by MRN, date of birth, ID band Patient awake    Reviewed: Allergy & Precautions, NPO status , Patient's Chart, lab work & pertinent test results  Airway Mallampati: I  TM Distance: >3 FB Neck ROM: Full    Dental  (+) Teeth Intact, Dental Advisory Given   Pulmonary asthma (mild, controlled) ,    breath sounds clear to auscultation       Cardiovascular  Rhythm:Regular Rate:Normal  ECG: SR, rate 84   Neuro/Psych  Headaches,    GI/Hepatic   Endo/Other    Renal/GU      Musculoskeletal   Abdominal (+) + obese,   Peds  Hematology  (+) Sickle cell trait and anemia ,   Anesthesia Other Findings Obese  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  Reproductive/Obstetrics                            Anesthesia Physical  Anesthesia Plan  ASA: II  Anesthesia Plan: General   Post-op Pain Management:    Induction: Intravenous  PONV Risk Score and Plan: 2 and Ondansetron, Dexamethasone, Propofol and Midazolam  Airway Management Planned: Oral ETT  Additional Equipment:   Intra-op Plan:   Post-operative Plan: Extubation in OR  Informed Consent: I have reviewed the patients History and Physical, chart, labs and discussed the procedure including the risks, benefits and alternatives for the proposed anesthesia with the patient or authorized representative who has indicated his/her understanding and acceptance.   Dental advisory given  Plan Discussed with: CRNA, Anesthesiologist and Surgeon  Anesthesia Plan Comments:        Anesthesia Quick Evaluation

## 2017-06-24 ENCOUNTER — Encounter (HOSPITAL_BASED_OUTPATIENT_CLINIC_OR_DEPARTMENT_OTHER): Payer: Self-pay | Admitting: *Deleted

## 2017-06-24 ENCOUNTER — Encounter (HOSPITAL_BASED_OUTPATIENT_CLINIC_OR_DEPARTMENT_OTHER)
Admission: RE | Disposition: A | Discharge status: Home / Self Care | Payer: Self-pay | Source: Ambulatory Visit | Attending: Obstetrics and Gynecology

## 2017-06-24 ENCOUNTER — Ambulatory Visit (HOSPITAL_BASED_OUTPATIENT_CLINIC_OR_DEPARTMENT_OTHER)
Admission: RE | Admit: 2017-06-24 | Discharge: 2017-06-24 | Disposition: A | Discharge status: Home / Self Care | Payer: 59 | Source: Ambulatory Visit | Attending: Obstetrics and Gynecology | Admitting: Obstetrics and Gynecology

## 2017-06-24 ENCOUNTER — Ambulatory Visit (HOSPITAL_BASED_OUTPATIENT_CLINIC_OR_DEPARTMENT_OTHER): Payer: 59 | Admitting: Anesthesiology

## 2017-06-24 DIAGNOSIS — N803 Endometriosis of pelvic peritoneum: Secondary | ICD-10-CM | POA: Insufficient documentation

## 2017-06-24 DIAGNOSIS — N800 Endometriosis of uterus: Secondary | ICD-10-CM | POA: Insufficient documentation

## 2017-06-24 DIAGNOSIS — N920 Excessive and frequent menstruation with regular cycle: Secondary | ICD-10-CM | POA: Diagnosis not present

## 2017-06-24 DIAGNOSIS — N946 Dysmenorrhea, unspecified: Secondary | ICD-10-CM | POA: Insufficient documentation

## 2017-06-24 DIAGNOSIS — N840 Polyp of corpus uteri: Secondary | ICD-10-CM | POA: Insufficient documentation

## 2017-06-24 HISTORY — PX: LAPAROSCOPY: SHX197

## 2017-06-24 HISTORY — DX: Excessive and frequent menstruation with regular cycle: N92.0

## 2017-06-24 HISTORY — DX: Personal history of other complications of pregnancy, childbirth and the puerperium: Z87.59

## 2017-06-24 HISTORY — DX: Irregular menstruation, unspecified: N92.6

## 2017-06-24 HISTORY — PX: HYSTEROSCOPY WITH D & C: SHX1775

## 2017-06-24 LAB — CBC
HEMATOCRIT: 32.4 % — AB (ref 36.0–46.0)
HEMOGLOBIN: 10.9 g/dL — AB (ref 12.0–15.0)
MCH: 24.5 pg — AB (ref 26.0–34.0)
MCHC: 33.6 g/dL (ref 30.0–36.0)
MCV: 72.8 fL — AB (ref 78.0–100.0)
Platelets: 412 10*3/uL — ABNORMAL HIGH (ref 150–400)
RBC: 4.45 MIL/uL (ref 3.87–5.11)
RDW: 15 % (ref 11.5–15.5)
WBC: 6.7 10*3/uL (ref 4.0–10.5)

## 2017-06-24 LAB — TYPE AND SCREEN
ABO/RH(D): O POS
Antibody Screen: NEGATIVE

## 2017-06-24 LAB — ABO/RH: ABO/RH(D): O POS

## 2017-06-24 LAB — POCT PREGNANCY, URINE: Preg Test, Ur: NEGATIVE

## 2017-06-24 SURGERY — DILATATION AND CURETTAGE /HYSTEROSCOPY
Anesthesia: General

## 2017-06-24 MED ORDER — DEXAMETHASONE SODIUM PHOSPHATE 4 MG/ML IJ SOLN
INTRAMUSCULAR | Status: DC | PRN
  Administered 2017-06-24: 10 mg via INTRAVENOUS

## 2017-06-24 MED ORDER — ROCURONIUM BROMIDE 50 MG/5ML IV SOSY
PREFILLED_SYRINGE | INTRAVENOUS | Status: DC | PRN
  Administered 2017-06-24: 50 mg via INTRAVENOUS
  Administered 2017-06-24: 10 mg via INTRAVENOUS

## 2017-06-24 MED ORDER — IBUPROFEN 600 MG PO TABS: 600.0000 mg | ORAL_TABLET | Freq: Four times a day (QID) | ORAL | 0 refills | Status: DC | PRN

## 2017-06-24 MED ORDER — DEXAMETHASONE SODIUM PHOSPHATE 10 MG/ML IJ SOLN
INTRAMUSCULAR | Status: AC
  Filled 2017-06-24: qty 1

## 2017-06-24 MED ORDER — LIDOCAINE 2% (20 MG/ML) 5 ML SYRINGE
INTRAMUSCULAR | Status: DC | PRN
  Administered 2017-06-24: 80 mg via INTRAVENOUS

## 2017-06-24 MED ORDER — PROPOFOL 10 MG/ML IV BOLUS
INTRAVENOUS | Status: DC | PRN
  Administered 2017-06-24: 180 mg via INTRAVENOUS

## 2017-06-24 MED ORDER — CEFAZOLIN SODIUM-DEXTROSE 2-4 GM/100ML-% IV SOLN
INTRAVENOUS | Status: AC
  Filled 2017-06-24: qty 100

## 2017-06-24 MED ORDER — MIDAZOLAM HCL 5 MG/5ML IJ SOLN
INTRAMUSCULAR | Status: DC | PRN
  Administered 2017-06-24: 2 mg via INTRAVENOUS

## 2017-06-24 MED ORDER — BUPIVACAINE HCL (PF) 0.25 % IJ SOLN
INTRAMUSCULAR | Status: DC | PRN
  Administered 2017-06-24: 30 mL

## 2017-06-24 MED ORDER — FENTANYL CITRATE (PF) 100 MCG/2ML IJ SOLN
INTRAMUSCULAR | Status: DC | PRN
  Administered 2017-06-24: 50 ug via INTRAVENOUS
  Administered 2017-06-24 (×2): 25 ug via INTRAVENOUS

## 2017-06-24 MED ORDER — ROCURONIUM BROMIDE 50 MG/5ML IV SOSY
PREFILLED_SYRINGE | INTRAVENOUS | Status: AC
  Filled 2017-06-24: qty 5

## 2017-06-24 MED ORDER — FENTANYL CITRATE (PF) 100 MCG/2ML IJ SOLN
INTRAMUSCULAR | Status: AC
  Filled 2017-06-24: qty 2

## 2017-06-24 MED ORDER — LIDOCAINE HCL 2 % IJ SOLN
INTRAMUSCULAR | Status: DC | PRN
  Administered 2017-06-24: 20 mL

## 2017-06-24 MED ORDER — ONDANSETRON HCL 4 MG/2ML IJ SOLN
INTRAMUSCULAR | Status: DC | PRN
  Administered 2017-06-24: 4 mg via INTRAVENOUS

## 2017-06-24 MED ORDER — LACTATED RINGERS IV SOLN
INTRAVENOUS | Status: DC
  Administered 2017-06-24 (×2): via INTRAVENOUS
  Filled 2017-06-24: qty 1000

## 2017-06-24 MED ORDER — OXYCODONE-ACETAMINOPHEN 5-325 MG PO TABS
ORAL_TABLET | ORAL | Status: AC
  Filled 2017-06-24: qty 1

## 2017-06-24 MED ORDER — KETOROLAC TROMETHAMINE 30 MG/ML IJ SOLN
INTRAMUSCULAR | Status: AC
  Filled 2017-06-24: qty 1

## 2017-06-24 MED ORDER — HYDROMORPHONE HCL 1 MG/ML IJ SOLN
INTRAMUSCULAR | Status: AC
  Filled 2017-06-24: qty 0.5

## 2017-06-24 MED ORDER — OXYCODONE HCL 5 MG PO TABS
5.0000 mg | ORAL_TABLET | Freq: Once | ORAL | Status: AC | PRN
  Administered 2017-06-24: 5 mg via ORAL
  Filled 2017-06-24: qty 1

## 2017-06-24 MED ORDER — OXYCODONE-ACETAMINOPHEN 5-325 MG PO TABS: 1.0000 | ORAL_TABLET | ORAL | 0 refills | Status: DC | PRN

## 2017-06-24 MED ORDER — HYDROMORPHONE HCL 1 MG/ML IJ SOLN
0.2500 mg | INTRAMUSCULAR | Status: DC | PRN
  Administered 2017-06-24 (×2): 0.5 mg via INTRAVENOUS
  Filled 2017-06-24: qty 0.5

## 2017-06-24 MED ORDER — SUGAMMADEX SODIUM 200 MG/2ML IV SOLN
INTRAVENOUS | Status: AC
  Filled 2017-06-24: qty 2

## 2017-06-24 MED ORDER — SUGAMMADEX SODIUM 200 MG/2ML IV SOLN
INTRAVENOUS | Status: DC | PRN
  Administered 2017-06-24: 200 mg via INTRAVENOUS

## 2017-06-24 MED ORDER — OXYCODONE HCL 5 MG/5ML PO SOLN
5.0000 mg | Freq: Once | ORAL | Status: AC | PRN
  Filled 2017-06-24: qty 5

## 2017-06-24 MED ORDER — LIDOCAINE 2% (20 MG/ML) 5 ML SYRINGE
INTRAMUSCULAR | Status: AC
  Filled 2017-06-24: qty 5

## 2017-06-24 MED ORDER — PROMETHAZINE HCL 25 MG/ML IJ SOLN
6.2500 mg | INTRAMUSCULAR | Status: DC | PRN
  Filled 2017-06-24: qty 1

## 2017-06-24 MED ORDER — PROPOFOL 10 MG/ML IV BOLUS
INTRAVENOUS | Status: AC
  Filled 2017-06-24: qty 20

## 2017-06-24 MED ORDER — LIDOCAINE HCL (CARDIAC) 20 MG/ML IV SOLN: INTRAVENOUS | Status: DC | PRN

## 2017-06-24 MED ORDER — ONDANSETRON HCL 4 MG/2ML IJ SOLN
INTRAMUSCULAR | Status: AC
  Filled 2017-06-24: qty 2

## 2017-06-24 MED ORDER — KETOROLAC TROMETHAMINE 30 MG/ML IJ SOLN
INTRAMUSCULAR | Status: DC | PRN
  Administered 2017-06-24: 30 mg via INTRAVENOUS

## 2017-06-24 MED ORDER — OXYCODONE HCL 5 MG PO TABS
ORAL_TABLET | ORAL | Status: AC
  Filled 2017-06-24: qty 1

## 2017-06-24 MED ORDER — WHITE PETROLATUM GEL
Status: AC
  Filled 2017-06-24: qty 5

## 2017-06-24 MED ORDER — MIDAZOLAM HCL 2 MG/2ML IJ SOLN
INTRAMUSCULAR | Status: AC
  Filled 2017-06-24: qty 2

## 2017-06-24 MED ORDER — CEFAZOLIN SODIUM-DEXTROSE 2-4 GM/100ML-% IV SOLN
2.0000 g | INTRAVENOUS | Status: AC
  Administered 2017-06-24: 2 g via INTRAVENOUS
  Filled 2017-06-24: qty 100

## 2017-06-24 SURGICAL SUPPLY — 45 items
APPLICATOR COTTON TIP 6IN STRL (MISCELLANEOUS) ×3 IMPLANT
BANDAGE ADHESIVE 1X3 (GAUZE/BANDAGES/DRESSINGS) IMPLANT
BENZOIN TINCTURE PRP APPL 2/3 (GAUZE/BANDAGES/DRESSINGS) IMPLANT
BIPOLAR CUTTING LOOP 21FR (ELECTRODE)
CABLE HIGH FREQUENCY MONO STRZ (ELECTRODE) ×3 IMPLANT
CANISTER SUCT 3000ML PPV (MISCELLANEOUS) ×3 IMPLANT
CATH ROBINSON RED A/P 16FR (CATHETERS) ×3 IMPLANT
CLOSURE WOUND 1/4X4 (GAUZE/BANDAGES/DRESSINGS)
CLOTH BEACON ORANGE TIMEOUT ST (SAFETY) ×3 IMPLANT
COUNTER NEEDLE 1200 MAGNETIC (NEEDLE) ×3 IMPLANT
DERMABOND ADVANCED (GAUZE/BANDAGES/DRESSINGS) ×4
DERMABOND ADVANCED .7 DNX12 (GAUZE/BANDAGES/DRESSINGS) ×2 IMPLANT
DILATOR CANAL MILEX (MISCELLANEOUS) IMPLANT
DRSG TELFA 3X8 NADH (GAUZE/BANDAGES/DRESSINGS) ×3 IMPLANT
DURAPREP 26ML APPLICATOR (WOUND CARE) ×3 IMPLANT
ELECT REM PT RETURN 9FT ADLT (ELECTROSURGICAL) ×3
ELECTRODE REM PT RTRN 9FT ADLT (ELECTROSURGICAL) ×1 IMPLANT
GLOVE BIO SURGEON STRL SZ 6.5 (GLOVE) ×4 IMPLANT
GLOVE BIO SURGEONS STRL SZ 6.5 (GLOVE) ×2
GLOVE INDICATOR 7.0 STRL GRN (GLOVE) ×6 IMPLANT
GLOVE SURG SS PI 7.0 STRL IVOR (GLOVE) ×6 IMPLANT
GOWN STRL REUS W/TWL LRG LVL3 (GOWN DISPOSABLE) ×6 IMPLANT
IV NS IRRIG 3000ML ARTHROMATIC (IV SOLUTION) ×3 IMPLANT
KIT RM TURNOVER CYSTO AR (KITS) ×3 IMPLANT
LOOP CUTTING BIPOLAR 21FR (ELECTRODE) IMPLANT
NEEDLE HYPO 25X1 1.5 SAFETY (NEEDLE) IMPLANT
NS IRRIG 500ML POUR BTL (IV SOLUTION) ×3 IMPLANT
PACK LAPAROSCOPY BASIN (CUSTOM PROCEDURE TRAY) ×3 IMPLANT
PACK TRENDGUARD 450 HYBRID PRO (MISCELLANEOUS) ×1 IMPLANT
PACK VAGINAL MINOR WOMEN LF (CUSTOM PROCEDURE TRAY) ×3 IMPLANT
PAD OB MATERNITY 4.3X12.25 (PERSONAL CARE ITEMS) ×3 IMPLANT
PAD PREP 24X48 CUFFED NSTRL (MISCELLANEOUS) ×3 IMPLANT
SET IRRIG TUBING LAPAROSCOPIC (IRRIGATION / IRRIGATOR) IMPLANT
SOLUTION ELECTROLUBE (MISCELLANEOUS) IMPLANT
STRIP CLOSURE SKIN 1/4X4 (GAUZE/BANDAGES/DRESSINGS) IMPLANT
SURGILUBE 2OZ TUBE FLIPTOP (MISCELLANEOUS) ×3 IMPLANT
SUT MNCRL AB 4-0 PS2 18 (SUTURE) ×3 IMPLANT
SYRINGE 10CC LL (SYRINGE) IMPLANT
TOWEL OR 17X24 6PK STRL BLUE (TOWEL DISPOSABLE) ×6 IMPLANT
TRENDGUARD 450 HYBRID PRO PACK (MISCELLANEOUS) ×3
TROCAR XCEL NON-BLD 5MMX100MML (ENDOMECHANICALS) ×6 IMPLANT
TUBING AQUILEX INFLOW (TUBING) ×3 IMPLANT
TUBING AQUILEX OUTFLOW (TUBING) ×3 IMPLANT
TUBING INSUF HEATED (TUBING) ×3 IMPLANT
WATER STERILE IRR 500ML POUR (IV SOLUTION) ×3 IMPLANT

## 2017-06-24 NOTE — Anesthesia Postprocedure Evaluation (Signed)
Anesthesia Post Note  Patient: Kimberly Guerra  Procedure(s) Performed: Procedure(s) (LRB): DILATATION AND CURETTAGE /HYSTEROSCOPY  pelvic biopsy and ablation of endometriosis (N/A) LAPAROSCOPY DIAGNOSTIC (N/A)     Patient location during evaluation: PACU Anesthesia Type: General Level of consciousness: awake and alert Pain management: pain level controlled Vital Signs Assessment: post-procedure vital signs reviewed and stable Respiratory status: spontaneous breathing, nonlabored ventilation, respiratory function stable and patient connected to nasal cannula oxygen Cardiovascular status: blood pressure returned to baseline and stable Postop Assessment: no signs of nausea or vomiting Anesthetic complications: no    Last Vitals:  Vitals:   06/24/17 0953 06/24/17 1246  BP: 113/73 120/72  Pulse: 72 70  Resp: (!) 8 16  Temp: 36.5 C 37.2 C    Last Pain:  Vitals:   06/24/17 1221  TempSrc:   PainSc: 3                  Kimberly Guerra P Kimberly Guerra

## 2017-06-24 NOTE — Brief Op Note (Addendum)
06/24/2017  7:58 AM  PATIENT:  Kimberly Guerra  31 y.o. female  PRE-OPERATIVE DIAGNOSIS:  N92.1 Menorrhagia/Irregular Cycle, Dysmenorreha  POST-OPERATIVE DIAGNOSIS:  Endometriosis  PROCEDURE:  Procedure(s): DILATATION AND CURETTAGE /HYSTEROSCOPY (N/A) LAPAROSCOPY DIAGNOSTIC (N/A) CHROMOPERTUBATION (N/A) Chromopertubation was not done Pelvic biopsies Ablation of endometriosis  SURGEON:  Surgeon(s) and Role:    Thurnell Lose, MD - Primary  PHYSICIAN ASSISTANT: none  ASSISTANTS: Technician   ANESTHESIA:   general  EBL: 20   Total I/O In: 1000 [I.V.:1000] Out: -  Deficit 50 ml   BLOOD ADMINISTERED:none  DRAINS: none   LOCAL MEDICATIONS USED:  2 %LIDOCAINE  and Amount: 10 ml, 0.25% Marcaine  SPECIMEN:  Source of Specimen:  Endometrial currettings, left cul de sac biopsies  DISPOSITION OF SPECIMEN:  PATHOLOGY  COUNTS:  YES  TOURNIQUET:  * No tourniquets in log *  DICTATION: .Other Dictation: Dictation Number [id number]  PLAN OF CARE: Discharge to home after PACU  PATIENT DISPOSITION:  PACU - hemodynamically stable.   Delay start of Pharmacological VTE agent (>24hrs) due to surgical blood loss or risk of bleeding: yes

## 2017-06-24 NOTE — Progress Notes (Signed)
No changes. Reviewed procedure.  Dx laparoscopy, Hysteroscopy D&C All questions answered.

## 2017-06-24 NOTE — H&P (View-Only) (Deleted)
No changes. Reviewed procedure.  Dx laparoscopy, Hysteroscopy D&C All questions answered.

## 2017-06-24 NOTE — Anesthesia Procedure Notes (Signed)
Procedure Name: Intubation Date/Time: 06/24/2017 8:07 AM Performed by: Denna Haggard D Pre-anesthesia Checklist: Patient identified, Emergency Drugs available, Suction available and Patient being monitored Patient Re-evaluated:Patient Re-evaluated prior to inductionOxygen Delivery Method: Circle system utilized Preoxygenation: Pre-oxygenation with 100% oxygen Intubation Type: IV induction Ventilation: Mask ventilation without difficulty Laryngoscope Size: Mac and 3 Grade View: Grade I Tube type: Oral Tube size: 7.0 mm Number of attempts: 1 Airway Equipment and Method: Stylet and Oral airway Placement Confirmation: ETT inserted through vocal cords under direct vision,  positive ETCO2 and breath sounds checked- equal and bilateral Secured at: 21 cm Tube secured with: Tape Dental Injury: Teeth and Oropharynx as per pre-operative assessment

## 2017-06-24 NOTE — Discharge Instructions (Addendum)
DISCHARGE INSTRUCTIONS: Laparoscopy ° °The following instructions have been prepared to help you care for yourself upon your return home today. ° °Wound care: °• Do not get the incision wet for the first 24 hours. The incision should be kept clean and dry. °• The Band-Aids or dressings may be removed the day after surgery. °• Should the incision become sore, red, and swollen after the first week, check with your doctor. ° °Personal hygiene: °• Shower the day after your procedure. ° °Activity and limitations: °• Do NOT drive or operate any equipment today. °• Do NOT lift anything more than 15 pounds for 2-3 weeks after surgery. °• Do NOT rest in bed all day. °• Walking is encouraged. Walk each day, starting slowly with 5-minute walks 3 or 4 times a day. Slowly increase the length of your walks. °• Walk up and down stairs slowly. °• Do NOT do strenuous activities, such as golfing, playing tennis, bowling, running, biking, weight lifting, gardening, mowing, or vacuuming for 2-4 weeks. Ask your doctor when it is okay to start. ° °Diet: Eat a light meal as desired this evening. You may resume your usual diet tomorrow. ° °Return to work: This is dependent on the type of work you do. For the most part you can return to a desk job within a week of surgery. If you are more active at work, please discuss this with your doctor. ° °What to expect after your surgery: You may have a slight burning sensation when you urinate on the first day. You may have a very small amount of blood in the urine. Expect to have a small amount of vaginal discharge/light bleeding for 1-2 weeks. It is not unusual to have abdominal soreness and bruising for up to 2 weeks. You may be tired and need more rest for about 1 week. You may experience shoulder pain for 24-72 hours. Lying flat in bed may relieve it. ° °Call your doctor for any of the following: °• Develop a fever of 100.4 or greater °• Inability to urinate 6 hours after discharge from  hospital °• Severe pain not relieved by pain medications °• Persistent of heavy bleeding at incision site °• Redness or swelling around incision site after a week °• Increasing nausea or vomiting ° °Patient Signature________________________________________ °Nurse Signature_________________________________________ °Post Anesthesia Home Care Instructions ° °Activity: °Get plenty of rest for the remainder of the day. A responsible individual must stay with you for 24 hours following the procedure.  °For the next 24 hours, DO NOT: °-Drive a car °-Operate machinery °-Drink alcoholic beverages °-Take any medication unless instructed by your physician °-Make any legal decisions or sign important papers. ° °Meals: °Start with liquid foods such as gelatin or soup. Progress to regular foods as tolerated. Avoid greasy, spicy, heavy foods. If nausea and/or vomiting occur, drink only clear liquids until the nausea and/or vomiting subsides. Call your physician if vomiting continues. ° °Special Instructions/Symptoms: °Your throat may feel dry or sore from the anesthesia or the breathing tube placed in your throat during surgery. If this causes discomfort, gargle with warm salt water. The discomfort should disappear within 24 hours. ° °If you had a scopolamine patch placed behind your ear for the management of post- operative nausea and/or vomiting: ° °1. The medication in the patch is effective for 72 hours, after which it should be removed.  Wrap patch in a tissue and discard in the trash. Wash hands thoroughly with soap and water. °2. You may remove the patch   earlier than 72 hours if you experience unpleasant side effects which may include dry mouth, dizziness or visual disturbances. 3. Avoid touching the patch. Wash your hands with soap and water after contact with the patch.   Endometriosis Endometriosis is a condition in which the tissue that lines the uterus (endometrium) grows outside of its normal location. The tissue  may grow in many locations close to the uterus, but it commonly grows on the ovaries, fallopian tubes, vagina, or bowel. When the uterus sheds the endometrium every menstrual cycle, there is bleeding wherever the endometrial tissue is located. This can cause pain because blood is irritating to tissues that are not normally exposed to it. What are the causes? The cause of endometriosis is not known. What increases the risk? You may be more likely to develop endometriosis if you:  Have a family history of endometriosis.  Have never given birth.  Started your period at age 62 or younger.  Have high levels of estrogen in your body.  Were exposed to a certain medicine (diethylstilbestrol) before you were born (in utero).  Had low birth weight.  Were born as a twin, triplet, or other multiple.  Have a BMI of less than 25. BMI is an estimate of body fat and is calculated from height and weight.  What are the signs or symptoms? Often, there are no symptoms of this condition. If you do have symptoms, they may:  Vary depending on where your endometrial tissue is growing.  Occur during your menstrual period (most common) or midcycle.  Come and go, or you may go months with no symptoms at all.  Stop with menopause.  Symptoms may include:  Pain in the back or abdomen.  Heavier bleeding during periods.  Pain during sex.  Painful bowel movements.  Infertility.  Pelvic pain.  Bleeding more than once a month.  How is this diagnosed? This condition is diagnosed based on your symptoms and a physical exam. You may have tests, such as:  Blood tests and urine tests. These may be done to help rule out other possible causes of your symptoms.  Ultrasound, to look for abnormal tissues.  An X-ray of the lower bowel (barium enema).  An ultrasound that is done through the vagina (transvaginally).  CT scan.  MRI.  Laparoscopy. In this procedure, a lighted, pencil-sized instrument  called a laparoscope is inserted into your abdomen through an incision. The laparoscope allows your health care provider to look at the organs inside your body and check for abnormal tissue to confirm the diagnosis. If abnormal tissue is found, your health care provider may remove a small piece of tissue (biopsy) to be examined under a microscope.  How is this treated? Treatment for this condition may include:  Medicines to relieve pain, such as NSAIDs.  Hormone therapy. This involves using artificial (synthetic) hormones to reduce endometrial tissue growth. Your health care provider may recommend using a hormonal form of birth control, or other medicines.  Surgery. This may be done to remove abnormal endometrial tissue. ? In some cases, tissue may be removed using a laparoscope and a laser (laparoscopic laser treatment). ? In severe cases, surgery may be done to remove the fallopian tubes, uterus, and ovaries (hysterectomy).  Follow these instructions at home:  Take over-the-counter and prescription medicines only as told by your health care provider.  Do not drive or use heavy machinery while taking prescription pain medicine.  Try to avoid activities that cause pain, including sexual activity.  Keep all follow-up visits as told by your health care provider. This is important. Contact a health care provider if:  You have pain in the area between your hip bones (pelvic area) that occurs: ? Before, during, or after your period. ? In between your period and gets worse during your period. ? During or after sex. ? With bowel movements or urination, especially during your period.  You have problems getting pregnant.  You have a fever. Get help right away if:  You have severe pain that does not get better with medicine.  You have severe nausea and vomiting, or you cannot eat without vomiting.  You have pain that affects only the lower, right side of your abdomen.  You have  abdominal pain that gets worse.  You have abdominal swelling.  You have blood in your stool. This information is not intended to replace advice given to you by your health care provider. Make sure you discuss any questions you have with your health care provider. Document Released: 12/11/2000 Document Revised: 09/18/2016 Document Reviewed: 05/16/2016 Elsevier Interactive Patient Education  2018 [employer].    Dilation and Curettage or Vacuum Curettage, Care After This sheet gives you information about how to care for yourself after your procedure. Your health care provider may also give you more specific instructions. If you have problems or questions, contact your health care provider. What can I expect after the procedure? After your procedure, it is common to have:  Mild pain or cramping.  Some vaginal bleeding or spotting.  These may last for up to 2 weeks after your procedure. Follow these instructions at home: Activity   Do not drive or use heavy machinery while taking prescription pain medicine.  Avoid driving for the first 24 hours after your procedure.  Take frequent, short walks, followed by rest periods, throughout the day. Ask your health care provider what activities are safe for you. After 1-2 days, you may be able to return to your normal activities.  Do not lift anything heavier than 10 lb (4.5 kg) until your health care provider approves.  For at least 2 weeks, or as long as told by your health care provider, do not: ? Douche. ? Use tampons. ? Have sexual intercourse. General instructions   Take over-the-counter and prescription medicines only as told by your health care provider. This is especially important if you take blood thinning medicine.  Do not take baths, swim, or use a hot tub until your health care provider approves. Take showers instead of baths.  Wear compression stockings as told by your health care provider. These stockings help to  prevent blood clots and reduce swelling in your legs.  It is your responsibility to get the results of your procedure. Ask your health care provider, or the department performing the procedure, when your results will be ready.  Keep all follow-up visits as told by your health care provider. This is important. Contact a health care provider if:  You have severe cramps that get worse or that do not get better with medicine.  You have severe abdominal pain.  You cannot drink fluids without vomiting.  You develop pain in a different area of your pelvis.  You have bad-smelling vaginal discharge.  You have a rash. Get help right away if:  You have vaginal bleeding that soaks more than one sanitary pad in 1 hour, for 2 hours in a row.  You pass large blood clots from your vagina.  You have a  fever that is above 100.37F (38.0C).  Your abdomen feels very tender or hard.  You have chest pain.  You have shortness of breath.  You cough up blood.  You feel dizzy or light-headed.  You faint.  You have pain in your neck or shoulder area. This information is not intended to replace advice given to you by your health care provider. Make sure you discuss any questions you have with your health care provider. Document Released: 12/11/2000 Document Revised: 08/12/2016 Document Reviewed: 07/16/2016 Elsevier Interactive Patient Education  2018 [employer].   Diagnostic Laparoscopy, Care After Refer to this sheet in the next few weeks. These instructions provide you with information about caring for yourself after your procedure. Your health care provider may also give you more specific instructions. Your treatment has been planned according to current medical practices, but problems sometimes occur. Call your health care provider if you have any problems or questions after your procedure. What can I expect after the procedure? After your procedure, it is common to have mild discomfort in  the throat and abdomen. Follow these instructions at home:  Take over-the-counter and prescription medicines only as told by your health care provider.  Do not drive for 24 hours if you received a sedative.  Return to your normal activities as told by your health care provider.  Do not take baths, swim, or use a hot tub until your health care provider approves. You may shower.  Follow instructions from your health care provider about how to take care of your incision. Make sure you: ? Wash your hands with soap and water before you change your bandage (dressing). If soap and water are not available, use hand sanitizer. ? Change your dressing as told by your health care provider. ? Leave stitches (sutures), skin glue, or adhesive strips in place. These skin closures may need to stay in place for 2 weeks or longer. If adhesive strip edges start to loosen and curl up, you may trim the loose edges. Do not remove adhesive strips completely unless your health care provider tells you to do that.  Check your incision area every day for signs of infection. Check for: ? More redness, swelling, or pain. ? More fluid or blood. ? Warmth. ? Pus or a bad smell.  It is your responsibility to get the results of your procedure. Ask your health care provider or the department performing the procedure when your results will be ready. Contact a health care provider if:  There is new pain in your shoulders.  You feel light-headed or faint.  You are unable to pass gas or unable to have a bowel movement.  You feel nauseous or you vomit.  You develop a rash.  You have more redness, swelling, or pain around your incision.  You have more fluid or blood coming from your incision.  Your incision feels warm to the touch.  You have pus or a bad smell coming from your incision.  You have a fever or chills. Get help right away if:  Your pain is getting worse.  You have ongoing vomiting.  The edges of  your incision open up.  You have trouble breathing.  You have chest pain. This information is not intended to replace advice given to you by your health care provider. Make sure you discuss any questions you have with your health care provider. Document Released: 11/25/2015 Document Revised: 05/21/2016 Document Reviewed: 08/27/2015 Elsevier Interactive Patient Education  2018 [employer].

## 2017-06-24 NOTE — Transfer of Care (Signed)
Immediate Anesthesia Transfer of Care Note  Patient: Kimberly Guerra  Procedure(s) Performed: Procedure(s) (LRB): DILATATION AND CURETTAGE /HYSTEROSCOPY  pelvic biopsy and ablation of endometriosis (N/A) LAPAROSCOPY DIAGNOSTIC (N/A) CHROMOPERTUBATION (N/A)  Patient Location: PACU  Anesthesia Type: General  Level of Consciousness: awake, oriented, sedated and patient cooperative  Airway & Oxygen Therapy: Patient Spontanous Breathing and Patient connected to face mask oxygen  Post-op Assessment: Report given to PACU RN and Post -op Vital signs reviewed and stable  Post vital signs: Reviewed and stable  Complications: No apparent anesthesia complications Last Vitals:  Vitals:   06/24/17 0630 06/24/17 0953  BP: 134/82 (P) 113/73  Pulse: 79 (P) 72  Resp: 16 (!) (P) 8  Temp: 36.8 C (P) 36.5 C    Last Pain:  Vitals:   06/24/17 0630  TempSrc: Oral      Patients Stated Pain Goal: 8 (06/24/17 0713)

## 2017-06-24 NOTE — Interval H&P Note (Deleted)
History and Physical Interval Note:  06/24/2017 7:50 AM  Kimberly Guerra  has presented today for surgery, with the diagnosis of N92.1 Menorrhagia/Irregular Cycle, Dysmenorrhea. The various methods of treatment have been discussed with the patient and family. After consideration of risks, benefits and other options for treatment, the patient has consented to  Procedure(s): DILATATION AND CURETTAGE /HYSTEROSCOPY (N/A) LAPAROSCOPY DIAGNOSTIC (N/A) CHROMOPERTUBATION (N/A) as a surgical intervention .  The patient's history has been reviewed, patient examined, no change in status, stable for surgery.  I have reviewed the patient's chart and labs.  Questions were answered to the patient's satisfaction.     Simona Huh, Arias Weinert

## 2017-06-25 ENCOUNTER — Encounter (HOSPITAL_BASED_OUTPATIENT_CLINIC_OR_DEPARTMENT_OTHER): Payer: Self-pay | Admitting: Obstetrics and Gynecology

## 2017-06-25 NOTE — Op Note (Signed)
NAME:  Kimberly Guerra, Kimberly Guerra                ACCOUNT NO.:  MEDICAL RECORD NO.:  [id number]  LOCATION:                                 FACILITY:  PHYSICIAN:  Jola Schmidt, MD        DATE OF BIRTH:  DATE OF PROCEDURE:  06/24/2017 DATE OF DISCHARGE:                              OPERATIVE REPORT   PREOPERATIVE DIAGNOSES:  Menorrhagia and irregular cycle and dysmenorrhea.  POSTOPERATIVE DIAGNOSIS:  Endometriosis.  PROCEDURE:  Hysteroscopy, D and C, diagnostic laparoscopy with peritoneal biopsies and ablation of endometriosis.  SURGEON:  Jola Schmidt, MD.  ASSISTANT:  Technician.  ANESTHESIA:  General.  ESTIMATED BLOOD LOSS:  20.  IV FLUIDS:  1000.  HYSTEROSCOPY DEFICIT:  50 mL.  BLOOD ADMINISTERED:  None.  DRAINS:  None.  LOCAL:  A 10 mL of 2% lidocaine and 0.25% Marcaine for the top.  SOURCE OF SPECIMEN:  Endometrial curettings and left cul-de-sac biopsies.  DISPOSITION OF SPECIMENS:  To pathology.  COUNTS:  Correct.  PLAN OF CARE:  Discharge to home after PACU.  PATIENT DISPOSITION:  To PACU hemodynamically stable.  COMPLICATIONS:  None.  FINDINGS:  Endometrial implants on the left cul-de-sac active and old and active endometriosis on the uterine fundus and endometrial implants on the ovary.  Uterus was retroverted, cervix is tenuous, endometrial cavity normal, thin endometrium.  PROCEDURE IN DETAIL:  Kimberly Guerra was identified in the holding area. She was then taken to the operating room with IV running.  She was placed in the dorsal lithotomy position, underwent general endotracheal anesthesia without complication.  She was positioned in the correct lithotomy position.  She was then prepped and draped in a normal sterile fashion.  A time-out was performed.  A Graves speculum was inserted into the vagina.  The single-tooth tenaculum was used to grasp the anterior lip of the cervix.  The uterine Hulka manipulator was then inserted without difficulty.   The Graves speculum was removed from the vagina, and attention was then turned to the abdomen.  The umbilicus was marked and 0.25% Marcaine was injected.  The scalpel was used to make an incision inside the umbilicus.  The 5-mm trocar and camera were then advanced under direct visualization into the abdominal cavity.  CO2 gas was used to insufflate the abdomen.  There was a second port that was inserted in a similar fashion that was also a 5 mm.  I would eventually put in all 5's and 2 in the right and left lower quadrants.  All under direct visualization.  The patient was placed into deep Trendelenburg.  The findings above were noted.  Bowel was displaced and cul-de-sac was examined.  I then got the biopsy 4 steps and took a biopsy of the cul-de-sac implant and I would then use the scissors for cautery on the surface.  No active bleeding was noted.  I then ablated the other areas of the endometriosis.  I then put in the third port, so that we could hold the ovary.  The endometrial implant was on the underside.  I attempted to biopsy, but due to uterine instability, I was unable to do so.  I ablated  the endometriosis on the ovary with the cautery via the scissors.  All trocars were removed under direct visualization.  The patient was laid flat and then, the incisions were reapproximated with 4-0 Monocryl subcutaneously.  Dermabond was then applied to the incisions.  Attention was then turned to the pelvis.  The Hulka manipulator was removed.  The Graves speculum was inserted.  Paracervical block was performed with lidocaine.  Anterior lip of the cervix was grasped with a single-tooth tenaculum.  Cervix was dilated up to a 17.  The cervix was tenuous.  We had to go the patient's left and then back, but I did that very easily.  Hysteroscope was then advanced and the endometrial cavity appeared normal.  I took a few pictures, took the hysteroscope out.  I then did a sharp curettage of  all 4 quadrants of the uterus with an adequate sample.  No active bleeding was noted.  Single-tooth tenaculum was removed.  Tenaculum sites were hemostatic.  All instruments were removed from the vagina.  The patient tolerated the procedure well.  All instrument, sponge, and needle counts were correct x3.  Ancef 2 g IV was administered prior to the procedure.  SCDs were on at the time of induction of anesthesia.  The patient tolerated the procedure well.     Jola Schmidt, MD     EBV/MEDQ  D:  06/24/2017  T:  06/24/2017  Job:  [id number]

## 2017-09-24 ENCOUNTER — Other Ambulatory Visit: Payer: Self-pay | Admitting: Emergency Medicine

## 2017-09-24 ENCOUNTER — Telehealth: Payer: Self-pay | Admitting: Physician Assistant

## 2017-09-24 MED ORDER — ALBUTEROL SULFATE HFA 108 (90 BASE) MCG/ACT IN AERS: 2.0000 | INHALATION_SPRAY | Freq: Four times a day (QID) | RESPIRATORY_TRACT | 0 refills | Status: DC | PRN

## 2017-09-24 NOTE — Telephone Encounter (Signed)
Rx sent to the preferred patient pharmacy  

## 2017-09-24 NOTE — Telephone Encounter (Signed)
Requesting refill of albuterol (PROVENTIL HFA;VENTOLIN HFA) 108 (90 Base) MCG/ACT inhaler.  Has upcoming appt for cpe on 09/29/17 but has run of refills for inhaler and states she really needs it.  Patient wants to know if pcp would be willing to approve refill.   Pharmacy:  [id number] [address], [place]. [phone] (Phone) [phone] (Fax)

## 2017-09-28 ENCOUNTER — Ambulatory Visit (INDEPENDENT_AMBULATORY_CARE_PROVIDER_SITE_OTHER): Payer: 59 | Admitting: Physician Assistant

## 2017-09-28 ENCOUNTER — Encounter: Payer: Self-pay | Admitting: Physician Assistant

## 2017-09-28 VITALS — BP 102/80 | HR 73 | Temp 98.4°F | Resp 14 | Ht <= 58 in | Wt 160.0 lb

## 2017-09-28 DIAGNOSIS — N809 Endometriosis, unspecified: Secondary | ICD-10-CM | POA: Diagnosis not present

## 2017-09-28 DIAGNOSIS — G43709 Chronic migraine without aura, not intractable, without status migrainosus: Secondary | ICD-10-CM | POA: Diagnosis not present

## 2017-09-28 DIAGNOSIS — K5900 Constipation, unspecified: Secondary | ICD-10-CM | POA: Diagnosis not present

## 2017-09-28 DIAGNOSIS — Z0000 Encounter for general adult medical examination without abnormal findings: Secondary | ICD-10-CM | POA: Diagnosis not present

## 2017-09-28 LAB — LIPID PANEL
CHOLESTEROL: 154 mg/dL (ref 0–200)
HDL: 59.1 mg/dL (ref >39.00)
LDL Cholesterol: 87 mg/dL (ref 0–99)
NonHDL: 95.34
Total CHOL/HDL Ratio: 3
Triglycerides: 42 mg/dL (ref 0.0–149.0)
VLDL: 8.4 mg/dL (ref 0.0–40.0)

## 2017-09-28 LAB — COMPREHENSIVE METABOLIC PANEL
ALBUMIN: 4.4 g/dL (ref 3.5–5.2)
ALK PHOS: 95 U/L (ref 39–117)
ALT: 14 U/L (ref 0–35)
AST: 15 U/L (ref 0–37)
BILIRUBIN TOTAL: 0.4 mg/dL (ref 0.2–1.2)
BUN: 10 mg/dL (ref 6–23)
CO2: 30 mEq/L (ref 19–32)
Calcium: 9.6 mg/dL (ref 8.4–10.5)
Chloride: 103 mEq/L (ref 96–112)
Creatinine, Ser: 0.6 mg/dL (ref 0.40–1.20)
GFR: 150.28 mL/min (ref >60.00)
Glucose, Bld: 85 mg/dL (ref 70–99)
POTASSIUM: 4.4 meq/L (ref 3.5–5.1)
Sodium: 138 mEq/L (ref 135–145)
TOTAL PROTEIN: 7.9 g/dL (ref 6.0–8.3)

## 2017-09-28 LAB — URINALYSIS, ROUTINE W REFLEX MICROSCOPIC
BILIRUBIN URINE: NEGATIVE
KETONES UR: NEGATIVE
LEUKOCYTES UA: NEGATIVE
NITRITE: NEGATIVE
PH: 5.5 (ref 5.0–8.0)
RBC / HPF: NONE SEEN (ref 0–?)
SPECIFIC GRAVITY, URINE: 1.025 (ref 1.000–1.030)
Total Protein, Urine: NEGATIVE
URINE GLUCOSE: NEGATIVE
UROBILINOGEN UA: 0.2 (ref 0.0–1.0)

## 2017-09-28 LAB — CBC
HEMATOCRIT: 37.7 % (ref 36.0–46.0)
HEMOGLOBIN: 12.3 g/dL (ref 12.0–15.0)
MCHC: 32.6 g/dL (ref 30.0–36.0)
MCV: 78.6 fl (ref 78.0–100.0)
Platelets: 425 10*3/uL — ABNORMAL HIGH (ref 150.0–400.0)
RBC: 4.79 Mil/uL (ref 3.87–5.11)
RDW: 15.6 % — ABNORMAL HIGH (ref 11.5–15.5)
WBC: 6.2 10*3/uL (ref 4.0–10.5)

## 2017-09-28 LAB — TSH: TSH: 0.45 u[IU]/mL (ref 0.35–4.50)

## 2017-09-28 NOTE — Assessment & Plan Note (Signed)
Doing well with Metoprolol as prophylactic therapy.

## 2017-09-28 NOTE — Assessment & Plan Note (Signed)
Continue care as directed by GYN.

## 2017-09-28 NOTE — Assessment & Plan Note (Signed)
Depression screen negative. Health Maintenance reviewed -- PAP up-to-date per patient. Immunizations up-to-date except for flu which patient declines.. Preventive schedule discussed and handout given in AVS. Will obtain fasting labs today.

## 2017-09-28 NOTE — Patient Instructions (Signed)
Please go to the lab today for blood work.  I will call you with your results. We will alter treatment regimen(s) if indicated by your results.   I encourage you to increase hydration and the amount of fiber in your diet.  Start a daily probiotic (Align, Culturelle, Digestive Advantage, etc.). If no bowel movement within 24 hours, take 2 Tbs of Milk of Magnesia in a 4 oz glass of warmed prune juice every 2-3 days to help promote bowel movement. If no results within 24 hours, then repeat above regimen, adding a Dulcolax stool softener to regimen. If this does not promote a bowel movement, please call the office.   Preventive Care 18-39 Years, Female Preventive care refers to lifestyle choices and visits with your health care provider that can promote health and wellness. What does preventive care include?  A yearly physical exam. This is also called an annual well check.  Dental exams once or twice a year.  Routine eye exams. Ask your health care provider how often you should have your eyes checked.  Personal lifestyle choices, including: ? Daily care of your teeth and gums. ? Regular physical activity. ? Eating a healthy diet. ? Avoiding tobacco and drug use. ? Limiting alcohol use. ? Practicing safe sex. ? Taking vitamin and mineral supplements as recommended by your health care provider. What happens during an annual well check? The services and screenings done by your health care provider during your annual well check will depend on your age, overall health, lifestyle risk factors, and family history of disease. Counseling Your health care provider may ask you questions about your:  Alcohol use.  Tobacco use.  Drug use.  Emotional well-being.  Home and relationship well-being.  Sexual activity.  Eating habits.  Work and work Statistician.  Method of birth control.  Menstrual cycle.  Pregnancy history.  Screening You may have the following tests or  measurements:  Height, weight, and BMI.  Diabetes screening. This is done by checking your blood sugar (glucose) after you have not eaten for a while (fasting).  Blood pressure.  Lipid and cholesterol levels. These may be checked every 5 years starting at age 36.  Skin check.  Hepatitis C blood test.  Hepatitis B blood test.  Sexually transmitted disease (STD) testing.  BRCA-related cancer screening. This may be done if you have a family history of breast, ovarian, tubal, or peritoneal cancers.  Pelvic exam and Pap test. This may be done every 3 years starting at age 76. Starting at age 66, this may be done every 5 years if you have a Pap test in combination with an HPV test.  Discuss your test results, treatment options, and if necessary, the need for more tests with your health care provider. Vaccines Your health care provider may recommend certain vaccines, such as:  Influenza vaccine. This is recommended every year.  Tetanus, diphtheria, and acellular pertussis (Tdap, Td) vaccine. You may need a Td booster every 10 years.  Varicella vaccine. You may need this if you have not been vaccinated.  HPV vaccine. If you are 81 or younger, you may need three doses over 6 months.  Measles, mumps, and rubella (MMR) vaccine. You may need at least one dose of MMR. You may also need a second dose.  Pneumococcal 13-valent conjugate (PCV13) vaccine. You may need this if you have certain conditions and were not previously vaccinated.  Pneumococcal polysaccharide (PPSV23) vaccine. You may need one or two doses if you smoke cigarettes  or if you have certain conditions.  Meningococcal vaccine. One dose is recommended if you are age 37-21 years and a first-year college student living in a residence hall, or if you have one of several medical conditions. You may also need additional booster doses.  Hepatitis A vaccine. You may need this if you have certain conditions or if you travel or work  in places where you may be exposed to hepatitis A.  Hepatitis B vaccine. You may need this if you have certain conditions or if you travel or work in places where you may be exposed to hepatitis B.  Haemophilus influenzae type b (Hib) vaccine. You may need this if you have certain risk factors.  Talk to your health care provider about which screenings and vaccines you need and how often you need them. This information is not intended to replace advice given to you by your health care provider. Make sure you discuss any questions you have with your health care provider. Document Released: 02/09/2002 Document Revised: 09/02/2016 Document Reviewed: 10/15/2015 Elsevier Interactive Patient Education  2017 [employer].

## 2017-09-28 NOTE — Progress Notes (Signed)
Patient presents to clinic today for annual exam.  Patient is fasting for labs.  Acute Concerns: Denies acute concerns.  Chronic Issues: Migraines -- Patient with history of chronic migraines. Is currently on Metoprolol 50 mg BID. Is taking as directed. Has not had a headache in months. Denies side effect of medication.  Endometriosis -- Followed by GYN. Had recent d/c and has been started on Provera and norethindrone. Has follow-up scheduled.   Health Maintenance: Immunizations -- Immunizations up-to-date. PAP -- Followed by GYN. Up-to-date.   Past Medical History:  Diagnosis Date  . Anemia   . Asthma    prn inhaler  . History of gestational hypertension   . Irregular menstrual cycle   . Menorrhagia   . Migraines   . Seasonal allergies   . Sickle cell trait [facility])     Past Surgical History:  Procedure Laterality Date  . CARPAL TUNNEL RELEASE Right 03/25/2015   Procedure:  ENDOSCOPIC CARPAL TUNNEL RELEASE;  Surgeon: Milly Jakob, MD;  Location: [place];  Service: Orthopedics;  Laterality: Right;  . CESAREAN SECTION  01/02/2009  . DORSAL COMPARTMENT RELEASE Right 03/25/2015   Procedure: RIGHT WRIST DEQUERVAIN RELEASE AND ;  Surgeon: Milly Jakob, MD;  Location: [place];  Service: Orthopedics;  Laterality: Right;  . HYSTEROSCOPY W/D&C N/A 06/24/2017   Procedure: DILATATION AND CURETTAGE /HYSTEROSCOPY  pelvic biopsy and ablation of endometriosis;  Surgeon: Thurnell Lose, MD;  Location: [facility];  Service: Gynecology;  Laterality: N/A;  . LAPAROSCOPY N/A 06/24/2017   Procedure: LAPAROSCOPY DIAGNOSTIC;  Surgeon: Thurnell Lose, MD;  Location: [facility];  Service: Gynecology;  Laterality: N/A;  . WISDOM TOOTH EXTRACTION      Current Outpatient Prescriptions on File Prior to Visit  Medication Sig Dispense Refill  . albuterol (PROVENTIL HFA;VENTOLIN HFA) 108 (90 Base) MCG/ACT inhaler Inhale 2 puffs into  the lungs every 6 (six) hours as needed. For wheeze or shortness of breath 60 g 0  . Cholecalciferol (VITAMIN D3) 400 units CAPS Take by mouth daily.    Marland Kitchen eletriptan (RELPAX) 40 MG tablet May repeat dose once in 2 hours if headache persists or recurs. 10 tablet 3  . fluticasone (FLONASE) 50 MCG/ACT nasal spray Place 2 sprays into both nostrils daily. (Patient taking differently: Place 2 sprays into both nostrils daily as needed. ) 16 g 6  . ibuprofen (ADVIL,MOTRIN) 600 MG tablet Take 1 tablet (600 mg total) by mouth every 6 (six) hours as needed. 30 tablet 0  . metoprolol (LOPRESSOR) 50 MG tablet TAKE 1 TABLET (50 MG TOTAL) BY MOUTH 2 (TWO) TIMES DAILY. (Patient taking differently: Take 50 mg by mouth 2 (two) times daily. ) 60 tablet 0  . Multiple Vitamins-Minerals (WOMENS MULTIVITAMIN PLUS) TABS Take by mouth daily.     No current facility-administered medications on file prior to visit.     No Known Allergies  Family History  Problem Relation Age of Onset  . Hypertension Mother   . Asthma Father   . Hypertension Father   . Asthma Brother   . Asthma Paternal Aunt   . Diabetes Paternal Aunt   . Cancer Maternal Grandfather        oral cancer  . Diabetes Maternal Grandfather   . Hypertension Maternal Grandfather     Social History   Social History  . Marital status: Single    Spouse name: N/A  . Number of children: 1  . Years of education:  12   Occupational History  . sales [employer]   Social History Main Topics  . Smoking status: Never Smoker  . Smokeless tobacco: Never Used  . Alcohol use 1.8 oz/week    3 Glasses of wine per week  . Drug use: No  . Sexual activity: Yes    Birth control/ protection: None   Other Topics Concern  . Not on file   Social History Narrative   Lives with son (2010), employeed at [employer] part-time. Completed some college classes.    Review of Systems  Constitutional: Negative for fever and weight loss.  HENT: Negative for ear discharge, ear  pain, hearing loss and tinnitus.   Eyes: Negative for blurred vision, double vision, photophobia and pain.  Respiratory: Negative for cough and shortness of breath.   Cardiovascular: Negative for chest pain and palpitations.  Gastrointestinal: Positive for constipation. Negative for abdominal pain, blood in stool, diarrhea, heartburn, melena, nausea and vomiting.  Genitourinary: Negative for dysuria, flank pain, frequency, hematuria and urgency.  Musculoskeletal: Negative for falls.  Neurological: Negative for dizziness, loss of consciousness and headaches.  Endo/Heme/Allergies: Negative for environmental allergies.  Psychiatric/Behavioral: Negative for depression, hallucinations, substance abuse and suicidal ideas. The patient is not nervous/anxious and does not have insomnia.    BP 102/80   Pulse 73   Temp 98.4 F (36.9 C) (Oral)   Resp 14   Ht 4\' 10"  (1.473 m)   Wt 160 lb (72.6 kg)   SpO2 98%   BMI 33.44 kg/m   Physical Exam  Constitutional: She is oriented to person, place, and time and well-developed, well-nourished, and in no distress.  HENT:  Head: Normocephalic and atraumatic.  Right Ear: Tympanic membrane, external ear and ear canal normal.  Left Ear: Tympanic membrane, external ear and ear canal normal.  Nose: Nose normal. No mucosal edema.  Mouth/Throat: Uvula is midline, oropharynx is clear and moist and mucous membranes are normal. No oropharyngeal exudate or posterior oropharyngeal erythema.  Eyes: Pupils are equal, round, and reactive to light. Conjunctivae are normal.  Neck: Neck supple. No thyromegaly present.  Cardiovascular: Normal rate, regular rhythm, normal heart sounds and intact distal pulses.   Pulmonary/Chest: Effort normal and breath sounds normal. No respiratory distress. She has no wheezes. She has no rales.  Abdominal: Soft. Bowel sounds are normal. She exhibits no distension and no mass. There is no tenderness. There is no rebound and no guarding.    Lymphadenopathy:    She has no cervical adenopathy.  Neurological: She is alert and oriented to person, place, and time. No cranial nerve deficit.  Skin: Skin is warm and dry. No rash noted.  Psychiatric: Affect normal.  Vitals reviewed.  Assessment/Plan: Visit for preventive health examination Depression screen negative. Health Maintenance reviewed -- PAP up-to-date per patient. Immunizations up-to-date except for flu which patient declines.. Preventive schedule discussed and handout given in AVS. Will obtain fasting labs today.   Endometriosis Continue care as directed by GYN.  Migraines Doing well with Metoprolol as prophylactic therapy.    Leeanne Rio, PA-C

## 2017-09-28 NOTE — Progress Notes (Signed)
Pre visit review using our clinic review tool, if applicable. No additional management support is needed unless otherwise documented below in the visit note. 

## 2017-11-17 ENCOUNTER — Other Ambulatory Visit: Payer: Self-pay | Admitting: Physician Assistant

## 2017-11-17 MED ORDER — METOPROLOL TARTRATE 50 MG PO TABS: 50.0000 mg | ORAL_TABLET | Freq: Two times a day (BID) | ORAL | 5 refills | Status: DC

## 2017-11-17 NOTE — Telephone Encounter (Signed)
Pt called about refill for metoprolol,  After putting in via mychart

## 2017-11-22 ENCOUNTER — Encounter: Payer: Self-pay | Admitting: Physician Assistant

## 2017-11-22 ENCOUNTER — Ambulatory Visit (INDEPENDENT_AMBULATORY_CARE_PROVIDER_SITE_OTHER): Payer: 59 | Admitting: Physician Assistant

## 2017-11-22 ENCOUNTER — Other Ambulatory Visit: Payer: Self-pay

## 2017-11-22 VITALS — BP 128/80 | HR 80 | Temp 98.3°F | Resp 14 | Ht <= 58 in | Wt 160.0 lb

## 2017-11-22 DIAGNOSIS — K2100 Gastro-esophageal reflux disease with esophagitis, without bleeding: Secondary | ICD-10-CM

## 2017-11-22 MED ORDER — PANTOPRAZOLE SODIUM 40 MG PO TBEC: 40.0000 mg | DELAYED_RELEASE_TABLET | Freq: Every day | ORAL | 3 refills | Status: DC

## 2017-11-22 NOTE — Progress Notes (Signed)
Pre visit review using our clinic review tool, if applicable. No additional management support is needed unless otherwise documented below in the visit note. 

## 2017-11-22 NOTE — Patient Instructions (Signed)
Stay well-hydrated and get plenty of rest.  Follow the dietary recommendations below.  Start an over-the-counter Zantac 150 mg nightly for a few days. Start the Protonix taking daily as directed. Follow-up with me in 2 weeks.   Food Choices for Gastroesophageal Reflux Disease, Adult When you have gastroesophageal reflux disease (GERD), the foods you eat and your eating habits are very important. Choosing the right foods can help ease your discomfort. What guidelines do I need to follow?  Choose fruits, vegetables, whole grains, and low-fat dairy products.  Choose low-fat meat, fish, and poultry.  Limit fats such as oils, salad dressings, butter, nuts, and avocado.  Keep a food diary. This helps you identify foods that cause symptoms.  Avoid foods that cause symptoms. These may be different for everyone.  Eat small meals often instead of 3 large meals a day.  Eat your meals slowly, in a place where you are relaxed.  Limit fried foods.  Cook foods using methods other than frying.  Avoid drinking alcohol.  Avoid drinking large amounts of liquids with your meals.  Avoid bending over or lying down until 2-3 hours after eating. What foods are not recommended? These are some foods and drinks that may make your symptoms worse: Vegetables Tomatoes. Tomato juice. Tomato and spaghetti sauce. Chili peppers. Onion and garlic. Horseradish. Fruits Oranges, grapefruit, and lemon (fruit and juice). Meats High-fat meats, fish, and poultry. This includes hot dogs, ribs, ham, sausage, salami, and bacon. Dairy Whole milk and chocolate milk. Sour cream. Cream. Butter. Ice cream. Cream cheese. Drinks Coffee and tea. Bubbly (carbonated) drinks or energy drinks. Condiments Hot sauce. Barbecue sauce. Sweets/Desserts Chocolate and cocoa. Donuts. Peppermint and spearmint. Fats and Oils High-fat foods. This includes [place] fries and potato chips. Other Vinegar. Strong spices. This includes  black pepper, white pepper, red pepper, cayenne, curry powder, cloves, ginger, and chili powder. The items listed above may not be a complete list of foods and drinks to avoid. Contact your dietitian for more information. This information is not intended to replace advice given to you by your health care provider. Make sure you discuss any questions you have with your health care provider. Document Released: 06/14/2012 Document Revised: 05/21/2016 Document Reviewed: 10/18/2013 Elsevier Interactive Patient Education  2017 [employer].

## 2017-11-22 NOTE — Progress Notes (Signed)
Patient presents to clinic today c/o 2 weeks of significant heart burn, reflux and globus sensation. Notes symptoms are worse when lying down. Notes occasional epigastric pain. Also occasional odynophagia. Denies fever, chills, change in bowel habitus, melena, hematochezia or tenesmus. Has not taken anything for symptoms. Patient states she has some concerns about her thyroid giving her symptoms.  Past Medical History:  Diagnosis Date  . Anemia   . Asthma    prn inhaler  . History of gestational hypertension   . Irregular menstrual cycle   . Menorrhagia   . Migraines   . Seasonal allergies   . Sickle cell trait ([place])     Current Outpatient Medications on File Prior to Visit  Medication Sig Dispense Refill  . albuterol (PROVENTIL HFA;VENTOLIN HFA) 108 (90 Base) MCG/ACT inhaler Inhale 2 puffs into the lungs every 6 (six) hours as needed. For wheeze or shortness of breath 60 g 0  . Cholecalciferol (VITAMIN D3) 400 units CAPS Take by mouth daily.    . fluticasone (FLONASE) 50 MCG/ACT nasal spray Place 2 sprays into both nostrils daily. (Patient taking differently: Place 2 sprays into both nostrils daily as needed. ) 16 g 6  . ibuprofen (ADVIL,MOTRIN) 600 MG tablet Take 1 tablet (600 mg total) by mouth every 6 (six) hours as needed. 30 tablet 0  . Iron-FA-B Cmp-C-Biot-Probiotic (FUSION PLUS) CAPS Take 1 capsule by mouth daily.    Marland Kitchen leuprolide (LUPRON) 11.25 MG injection Inject 11.25 mg into the muscle every 3 (three) months.    . metoprolol tartrate (LOPRESSOR) 50 MG tablet Take 1 tablet (50 mg total) by mouth 2 (two) times daily. 60 tablet 5  . Multiple Vitamins-Minerals (WOMENS MULTIVITAMIN PLUS) TABS Take by mouth daily.    . norethindrone (AYGESTIN) 5 MG tablet Take 1 tablet by mouth daily.     Marland Kitchen eletriptan (RELPAX) 40 MG tablet May repeat dose once in 2 hours if headache persists or recurs. (Patient not taking: Reported on 11/22/2017) 10 tablet 3   No current facility-administered  medications on file prior to visit.     No Known Allergies  Family History  Problem Relation Age of Onset  . Hypertension Mother   . Asthma Father   . Hypertension Father   . Asthma Brother   . Asthma Paternal Aunt   . Diabetes Paternal Aunt   . Cancer Maternal Grandfather        oral cancer  . Diabetes Maternal Grandfather   . Hypertension Maternal Grandfather     Social History   Socioeconomic History  . Marital status: Single    Spouse name: None  . Number of children: 1  . Years of education: 9  . Highest education level: None  Social Needs  . Financial resource strain: None  . Food insecurity - worry: None  . Food insecurity - inability: None  . Transportation needs - medical: None  . Transportation needs - non-medical: None  Occupational History  . Occupation: Scientist, clinical (histocompatibility and immunogenetics): [employer]  Tobacco Use  . Smoking status: Never Smoker  . Smokeless tobacco: Never Used  Substance and Sexual Activity  . Alcohol use: Yes    Alcohol/week: 1.8 oz    Types: 3 Glasses of wine per week  . Drug use: No  . Sexual activity: Yes    Birth control/protection: None  Other Topics Concern  . None  Social History Narrative   Lives with son (2010), employeed at [employer] part-time. Completed some college classes.  Review of Systems - See HPI.  All other ROS are negative.  BP 128/80   Pulse 80   Temp 98.3 F (36.8 C) (Oral)   Resp 14   Ht 4\' 10"  (1.473 m)   Wt 160 lb (72.6 kg)   SpO2 99%   BMI 33.44 kg/m   Physical Exam  Constitutional: She is oriented to person, place, and time and well-developed, well-nourished, and in no distress.  HENT:  Head: Normocephalic and atraumatic.  Eyes: Conjunctivae are normal.  Neck: Neck supple. No thyromegaly present.  Cardiovascular: Normal rate, regular rhythm, normal heart sounds and intact distal pulses.  Pulmonary/Chest: Effort normal and breath sounds normal. No respiratory distress. She has no wheezes. She has no rales. She  exhibits no tenderness.  Abdominal: Bowel sounds are normal. There is no hepatosplenomegaly. There is tenderness in the epigastric area and left upper quadrant. There is no rigidity, no guarding, no tenderness at McBurney's point and negative Murphy's sign.  Neurological: She is alert and oriented to person, place, and time.  Skin: Skin is warm and dry. No rash noted.  Psychiatric: Affect normal.  Vitals reviewed.  Recent Results (from the past 2160 hour(s))  CBC     Status: Abnormal   Collection Time: 09/28/17 11:37 AM  Result Value Ref Range   WBC 6.2 4.0 - 10.5 K/uL   RBC 4.79 3.87 - 5.11 Mil/uL   Platelets 425.0 (H) 150.0 - 400.0 K/uL   Hemoglobin 12.3 12.0 - 15.0 g/dL   HCT 37.7 36.0 - 46.0 %   MCV 78.6 78.0 - 100.0 fl   MCHC 32.6 30.0 - 36.0 g/dL   RDW 15.6 (H) 11.5 - 15.5 %  Comprehensive metabolic panel     Status: None   Collection Time: 09/28/17 11:37 AM  Result Value Ref Range   Sodium 138 135 - 145 mEq/L   Potassium 4.4 3.5 - 5.1 mEq/L   Chloride 103 96 - 112 mEq/L   CO2 30 19 - 32 mEq/L   Glucose, Bld 85 70 - 99 mg/dL   BUN 10 6 - 23 mg/dL   Creatinine, Ser 0.60 0.40 - 1.20 mg/dL   Total Bilirubin 0.4 0.2 - 1.2 mg/dL   Alkaline Phosphatase 95 39 - 117 U/L   AST 15 0 - 37 U/L   ALT 14 0 - 35 U/L   Total Protein 7.9 6.0 - 8.3 g/dL   Albumin 4.4 3.5 - 5.2 g/dL   Calcium 9.6 8.4 - 10.5 mg/dL   GFR 150.28 >60.00 mL/min  Lipid panel     Status: None   Collection Time: 09/28/17 11:37 AM  Result Value Ref Range   Cholesterol 154 0 - 200 mg/dL    Comment: ATP III Classification       Desirable:  < 200 mg/dL               Borderline High:  200 - 239 mg/dL          High:  > = 240 mg/dL   Triglycerides 42.0 0.0 - 149.0 mg/dL    Comment: Normal:  <150 mg/dLBorderline High:  150 - 199 mg/dL   HDL 59.10 >39.00 mg/dL   VLDL 8.4 0.0 - 40.0 mg/dL   LDL Cholesterol 87 0 - 99 mg/dL   Total CHOL/HDL Ratio 3     Comment:                Men          Women1/2  Average Risk     3.4           3.3Average Risk          5.0          4.42X Average Risk          9.6          7.13X Average Risk          15.0          11.0                       NonHDL 95.34     Comment: NOTE:  Non-HDL goal should be 30 mg/dL higher than patient's LDL goal (i.e. LDL goal of < 70 mg/dL, would have non-HDL goal of < 100 mg/dL)  TSH     Status: None   Collection Time: 09/28/17 11:37 AM  Result Value Ref Range   TSH 0.45 0.35 - 4.50 uIU/mL  Urinalysis, Routine w reflex microscopic     Status: Abnormal   Collection Time: 09/28/17 11:37 AM  Result Value Ref Range   Color, Urine YELLOW Yellow;Lt. Yellow   APPearance CLEAR Clear   Specific Gravity, Urine 1.025 1.000 - 1.030   pH 5.5 5.0 - 8.0   Total Protein, Urine NEGATIVE Negative   Urine Glucose NEGATIVE Negative   Ketones, ur NEGATIVE Negative   Bilirubin Urine NEGATIVE Negative   Hgb urine dipstick TRACE-INTACT (A) Negative   Urobilinogen, UA 0.2 0.0 - 1.0   Leukocytes, UA NEGATIVE Negative   Nitrite NEGATIVE Negative   WBC, UA 0-2/hpf 0-2/hpf   RBC / HPF none seen 0-2/hpf   Squamous Epithelial / LPF Rare(0-4/hpf) Rare(0-4/hpf)   Bacteria, UA Few(10-50/hpf) (A) None   Assessment/Plan: 1. GERD with esophagitis Will check H. Pylori IgG and TSH today (TSH giving patient concerns). No thyroid nodule or thyromegaly. Discussed symptoms are most consistent for GERD with mild gastritis/esophagitis. Start Pantoprazole 40 mg daily. Also start Zantac 150 QHS. GERD diet reviewed. Start probiotic. Close follow-up scheduled. - TSH - H. pylori antibody, IgG   Leeanne Rio, PA-C

## 2017-11-23 LAB — TSH: TSH: 0.54 m[IU]/L

## 2017-11-23 LAB — H. PYLORI ANTIBODY, IGG: H PYLORI IGG: NEGATIVE

## 2018-02-28 ENCOUNTER — Emergency Department (HOSPITAL_COMMUNITY): Admission: EM | Admit: 2018-02-28 | Discharge: 2018-02-28 | Discharge status: Against Medical Advice | Payer: 59

## 2018-02-28 NOTE — ED Notes (Addendum)
Called Pt in lobby no response x2. 

## 2018-02-28 NOTE — ED Notes (Signed)
Called pt in lobby, no response x3.

## 2018-02-28 NOTE — ED Notes (Signed)
Called Pt in lobby for vitals, no response x1.

## 2018-03-01 ENCOUNTER — Ambulatory Visit (INDEPENDENT_AMBULATORY_CARE_PROVIDER_SITE_OTHER): Payer: 59

## 2018-03-01 ENCOUNTER — Ambulatory Visit: Payer: Self-pay

## 2018-03-01 ENCOUNTER — Encounter: Payer: Self-pay | Admitting: *Deleted

## 2018-03-01 ENCOUNTER — Other Ambulatory Visit: Payer: Self-pay

## 2018-03-01 ENCOUNTER — Encounter: Payer: Self-pay | Admitting: Physician Assistant

## 2018-03-01 ENCOUNTER — Ambulatory Visit: Payer: 59 | Admitting: Physician Assistant

## 2018-03-01 VITALS — BP 120/90 | HR 83 | Temp 98.3°F | Resp 18 | Ht <= 58 in | Wt 161.0 lb

## 2018-03-01 DIAGNOSIS — J45901 Unspecified asthma with (acute) exacerbation: Secondary | ICD-10-CM

## 2018-03-01 DIAGNOSIS — R6889 Other general symptoms and signs: Secondary | ICD-10-CM

## 2018-03-01 LAB — POC INFLUENZA A&B (BINAX/QUICKVUE)
Influenza A, POC: NEGATIVE
Influenza B, POC: NEGATIVE

## 2018-03-01 MED ORDER — PREDNISONE 20 MG PO TABS: 40.0000 mg | ORAL_TABLET | Freq: Every day | ORAL | 0 refills | Status: DC

## 2018-03-01 MED ORDER — OSELTAMIVIR PHOSPHATE 75 MG PO CAPS: 75.0000 mg | ORAL_CAPSULE | Freq: Two times a day (BID) | ORAL | 0 refills | Status: DC

## 2018-03-01 MED ORDER — IPRATROPIUM-ALBUTEROL 0.5-2.5 (3) MG/3ML IN SOLN
3.0000 mL | Freq: Once | RESPIRATORY_TRACT | Status: AC
  Administered 2018-03-01: 3 mL via RESPIRATORY_TRACT

## 2018-03-01 MED ORDER — BENZONATATE 100 MG PO CAPS: 100.0000 mg | ORAL_CAPSULE | Freq: Two times a day (BID) | ORAL | 0 refills | Status: DC | PRN

## 2018-03-01 NOTE — Telephone Encounter (Signed)
Called pt. Back to verify she called 911. States she feels "better" and refuses to call 911. Coming into the office. Elizabeth in the office notified.

## 2018-03-01 NOTE — Telephone Encounter (Signed)
Pt. Short of breath; speaking in few words and can hear her difficulty. States inhaler is not working. Went to ED yesterday and left because of long wait. Pt. States she has wheezing. Instructed pt. And Mom to call 911.Mother verbalizes understanding. Reason for Disposition . [1] MODERATE difficulty breathing (e.g., speaks in phrases, SOB even at rest, pulse 100-120) AND [2] NEW-onset or WORSE than normal  Answer Assessment - Initial Assessment Questions 1. RESPIRATORY STATUS: "Describe your breathing?" (e.g., wheezing, shortness of breath, unable to speak, severe coughing)      Wheezing 2. ONSET: "When did this breathing problem begin?"      Yesterday 3. PATTERN "Does the difficult breathing come and go, or has it been constant since it started?"      Constant 4. SEVERITY: "How bad is your breathing?" (e.g., mild, moderate, severe)    - MILD: No SOB at rest, mild SOB with walking, speaks normally in sentences, can lay down, no retractions, pulse < 100.    - MODERATE: SOB at rest, SOB with minimal exertion and prefers to sit, cannot lie down flat, speaks in phrases, mild retractions, audible wheezing, pulse 100-120.    - SEVERE: Very SOB at rest, speaks in single words, struggling to breathe, sitting hunched forward, retractions, pulse > 120      Moderate 5. RECURRENT SYMPTOM: "Have you had difficulty breathing before?" If so, ask: "When was the last time?" and "What happened that time?"      Yes 6. CARDIAC HISTORY: "Do you have any history of heart disease?" (e.g., heart attack, angina, bypass surgery, angioplasty)      No 7. LUNG HISTORY: "Do you have any history of lung disease?"  (e.g., pulmonary embolus, asthma, emphysema)     Asthma 8. CAUSE: "What do you think is causing the breathing problem?"      Asthma 9. OTHER SYMPTOMS: "Do you have any other symptoms? (e.g., dizziness, runny nose, cough, chest pain, fever)     Cough, chest tightness 10. PREGNANCY: "Is there any chance you are  pregnant?" "When was your last menstrual period?"       No 11. TRAVEL: "Have you traveled out of the country in the last month?" (e.g., travel history, exposures)       No  Protocols used: BREATHING DIFFICULTY-A-AH

## 2018-03-01 NOTE — Progress Notes (Signed)
Patient presents to clinic today c/o sudden onset of chills, aches, cough, fever and fatigue starting Sunday. Since then has noted chest congestion, tightness and wheezing. Is taking her albuterol with minimal improvement. Denies recent travel or sick contact. Has taken some Mucinex over the counter as well.   Past Medical History:  Diagnosis Date  . Anemia   . Asthma    prn inhaler  . History of gestational hypertension   . Irregular menstrual cycle   . Menorrhagia   . Migraines   . Seasonal allergies   . Sickle cell trait ([place])     Current Outpatient Medications on File Prior to Visit  Medication Sig Dispense Refill  . albuterol (PROVENTIL HFA;VENTOLIN HFA) 108 (90 Base) MCG/ACT inhaler Inhale 2 puffs into the lungs every 6 (six) hours as needed. For wheeze or shortness of breath 60 g 0  . Cholecalciferol (VITAMIN D3) 400 units CAPS Take by mouth daily.    . fluticasone (FLONASE) 50 MCG/ACT nasal spray Place 2 sprays into both nostrils daily. (Patient taking differently: Place 2 sprays into both nostrils daily as needed. ) 16 g 6  . ibuprofen (ADVIL,MOTRIN) 600 MG tablet Take 1 tablet (600 mg total) by mouth every 6 (six) hours as needed. 30 tablet 0  . Iron-FA-B Cmp-C-Biot-Probiotic (FUSION PLUS) CAPS Take 1 capsule by mouth daily.    Marland Kitchen leuprolide (LUPRON) 11.25 MG injection Inject 11.25 mg into the muscle every 3 (three) months.    . metoprolol tartrate (LOPRESSOR) 50 MG tablet Take 1 tablet (50 mg total) by mouth 2 (two) times daily. 60 tablet 5  . Multiple Vitamins-Minerals (WOMENS MULTIVITAMIN PLUS) TABS Take by mouth daily.    . norethindrone (AYGESTIN) 5 MG tablet Take 1 tablet by mouth daily.     . pantoprazole (PROTONIX) 40 MG tablet Take 1 tablet (40 mg total) by mouth daily. 30 tablet 3   No current facility-administered medications on file prior to visit.     No Known Allergies  Family History  Problem Relation Age of Onset  . Hypertension Mother   . Asthma  Father   . Hypertension Father   . Asthma Brother   . Asthma Paternal Aunt   . Diabetes Paternal Aunt   . Cancer Maternal Grandfather        oral cancer  . Diabetes Maternal Grandfather   . Hypertension Maternal Grandfather     Social History   Socioeconomic History  . Marital status: Single    Spouse name: None  . Number of children: 1  . Years of education: 50  . Highest education level: None  Social Needs  . Financial resource strain: None  . Food insecurity - worry: None  . Food insecurity - inability: None  . Transportation needs - medical: None  . Transportation needs - non-medical: None  Occupational History  . Occupation: Scientist, clinical (histocompatibility and immunogenetics): [employer]  Tobacco Use  . Smoking status: Never Smoker  . Smokeless tobacco: Never Used  Substance and Sexual Activity  . Alcohol use: Yes    Alcohol/week: 1.8 oz    Types: 3 Glasses of wine per week  . Drug use: No  . Sexual activity: Yes    Birth control/protection: None  Other Topics Concern  . None  Social History Narrative   Lives with son (2010), employeed at [employer] part-time. Completed some college classes.    Review of Systems - See HPI.  All other ROS are negative.  BP 120/90  Pulse 83   Temp 98.3 F (36.8 C) (Oral)   Resp 18   Ht 4\' 10"  (1.473 m)   Wt 161 lb (73 kg)   SpO2 98%   BMI 33.65 kg/m   Physical Exam  Constitutional: She is oriented to person, place, and time and well-developed, well-nourished, and in no distress.  HENT:  Head: Normocephalic and atraumatic.  Right Ear: External ear normal.  Left Ear: External ear normal.  Nose: Nose normal.  Mouth/Throat: Oropharynx is clear and moist. No oropharyngeal exudate.  TM within normal limits bilaterally.  Eyes: Conjunctivae are normal.  Neck: Neck supple.  Cardiovascular: Normal rate, regular rhythm, normal heart sounds and intact distal pulses.  Pulmonary/Chest: Effort normal and breath sounds normal. No respiratory distress. She has no  wheezes. She has no rales. She exhibits no tenderness.  Neurological: She is alert and oriented to person, place, and time.  Skin: Skin is warm and dry. No rash noted.  Psychiatric: Affect normal.  Vitals reviewed.  Assessment/Plan: 1. Asthmatic bronchitis with acute exacerbation, unspecified asthma severity, unspecified whether persistent Duoneb given with improvement in wheeze and cough. Start Tessalon and Prednisone burst. Supportive measures and OTC medications reviewed. Will obtain CXR today to r/o pneumonia.  - POC Influenza A&B(BINAX/QUICKVUE) - ipratropium-albuterol (DUONEB) 0.5-2.5 (3) MG/3ML nebulizer solution 3 mL - benzonatate (TESSALON) 100 MG capsule; Take 1 capsule (100 mg total) by mouth 2 (two) times daily as needed for cough.  Dispense: 20 capsule; Refill: 0 - oseltamivir (TAMIFLU) 75 MG capsule; Take 1 capsule (75 mg total) by mouth 2 (two) times daily.  Dispense: 10 capsule; Refill: 0 - DG Chest 2 View; Future - predniSONE (DELTASONE) 20 MG tablet; Take 2 tablets (40 mg total) by mouth daily with breakfast.  Dispense: 10 tablet; Refill: 0  2. Flu-like symptoms Flu swab negative but patient with abrupt onset of typical flu like symptoms. Giving respiratory history, will start Tamiflu.    Leeanne Rio, PA-C

## 2018-03-01 NOTE — Patient Instructions (Signed)
Please go to the [facility] office for a chest x-ray. I will call you with your results.   Please take the Tamiflu, Tessalon and Prednisone as directed. Increase fluids and get plenty of rest.  Continue Mucinex. Albuterol as directed when needed.  ER for any worsening symptoms.

## 2018-03-03 ENCOUNTER — Other Ambulatory Visit: Payer: Self-pay

## 2018-03-03 ENCOUNTER — Encounter: Payer: Self-pay | Admitting: Physician Assistant

## 2018-03-03 ENCOUNTER — Ambulatory Visit (INDEPENDENT_AMBULATORY_CARE_PROVIDER_SITE_OTHER): Payer: 59 | Admitting: Physician Assistant

## 2018-03-03 VITALS — BP 112/86 | HR 77 | Temp 98.4°F | Resp 16 | Ht <= 58 in | Wt 160.0 lb

## 2018-03-03 DIAGNOSIS — J4531 Mild persistent asthma with (acute) exacerbation: Secondary | ICD-10-CM | POA: Diagnosis not present

## 2018-03-03 DIAGNOSIS — L509 Urticaria, unspecified: Secondary | ICD-10-CM

## 2018-03-03 MED ORDER — HYDROXYZINE HCL 25 MG PO TABS: 25.0000 mg | ORAL_TABLET | Freq: Three times a day (TID) | ORAL | 0 refills | Status: DC | PRN

## 2018-03-03 NOTE — Patient Instructions (Signed)
Please complete entire course of prednisone.  Due to the hives, please stop the Tessalon and the Tamiflu. Take the hydroxyzine as directed for itch/hives. These should resolve once the offending agents are removed from the system.  Use Mucinex-DM for cough and congestion.

## 2018-03-03 NOTE — Progress Notes (Signed)
Patient presents to clinic today for follow-up of asthma exacerbation and flu-like symptoms. Patient is taking the prednisone, tessalon and tamiflu as directed. Has noted significant improvement in breathing. Cough has improved as well. Denies fever, chest pain or SOB. Has noted Hives starting yesterday evening after taking her Tamiflu and Tessalon. Are itchy and slightly red. Denies change to soaps/lotions or detergents. Denies new food.  Past Medical History:  Diagnosis Date  . Anemia   . Asthma    prn inhaler  . History of gestational hypertension   . Irregular menstrual cycle   . Menorrhagia   . Migraines   . Seasonal allergies   . Sickle cell trait ([place])     Current Outpatient Medications on File Prior to Visit  Medication Sig Dispense Refill  . albuterol (PROVENTIL HFA;VENTOLIN HFA) 108 (90 Base) MCG/ACT inhaler Inhale 2 puffs into the lungs every 6 (six) hours as needed. For wheeze or shortness of breath 60 g 0  . benzonatate (TESSALON) 100 MG capsule Take 1 capsule (100 mg total) by mouth 2 (two) times daily as needed for cough. 20 capsule 0  . Cholecalciferol (VITAMIN D3) 400 units CAPS Take by mouth daily.    . fluticasone (FLONASE) 50 MCG/ACT nasal spray Place 2 sprays into both nostrils daily. (Patient taking differently: Place 2 sprays into both nostrils daily as needed. ) 16 g 6  . ibuprofen (ADVIL,MOTRIN) 600 MG tablet Take 1 tablet (600 mg total) by mouth every 6 (six) hours as needed. 30 tablet 0  . Iron-FA-B Cmp-C-Biot-Probiotic (FUSION PLUS) CAPS Take 1 capsule by mouth daily.    Marland Kitchen leuprolide (LUPRON) 11.25 MG injection Inject 11.25 mg into the muscle every 3 (three) months.    . metoprolol tartrate (LOPRESSOR) 50 MG tablet Take 1 tablet (50 mg total) by mouth 2 (two) times daily. 60 tablet 5  . Multiple Vitamins-Minerals (WOMENS MULTIVITAMIN PLUS) TABS Take by mouth daily.    . norethindrone (AYGESTIN) 5 MG tablet Take 1 tablet by mouth daily.     Marland Kitchen oseltamivir  (TAMIFLU) 75 MG capsule Take 1 capsule (75 mg total) by mouth 2 (two) times daily. 10 capsule 0  . pantoprazole (PROTONIX) 40 MG tablet Take 1 tablet (40 mg total) by mouth daily. 30 tablet 3  . predniSONE (DELTASONE) 20 MG tablet Take 2 tablets (40 mg total) by mouth daily with breakfast. 10 tablet 0   No current facility-administered medications on file prior to visit.     No Known Allergies  Family History  Problem Relation Age of Onset  . Hypertension Mother   . Asthma Father   . Hypertension Father   . Asthma Brother   . Asthma Paternal Aunt   . Diabetes Paternal Aunt   . Cancer Maternal Grandfather        oral cancer  . Diabetes Maternal Grandfather   . Hypertension Maternal Grandfather     Social History   Socioeconomic History  . Marital status: Single    Spouse name: None  . Number of children: 1  . Years of education: 45  . Highest education level: None  Social Needs  . Financial resource strain: None  . Food insecurity - worry: None  . Food insecurity - inability: None  . Transportation needs - medical: None  . Transportation needs - non-medical: None  Occupational History  . Occupation: Scientist, clinical (histocompatibility and immunogenetics): [employer]  Tobacco Use  . Smoking status: Never Smoker  . Smokeless tobacco: Never Used  Substance and Sexual Activity  . Alcohol use: Yes    Alcohol/week: 1.8 oz    Types: 3 Glasses of wine per week  . Drug use: No  . Sexual activity: Yes    Birth control/protection: None  Other Topics Concern  . None  Social History Narrative   Lives with son (2010), employeed at [employer] part-time. Completed some college classes.    Review of Systems - See HPI.  All other ROS are negative.  BP 112/86   Pulse 77   Temp 98.4 F (36.9 C) (Oral)   Resp 16   Ht 4\' 10"  (1.473 m)   Wt 160 lb (72.6 kg)   SpO2 98%   BMI 33.44 kg/m   Physical Exam  Constitutional: She is oriented to person, place, and time and well-developed, well-nourished, and in no distress.    HENT:  Head: Normocephalic and atraumatic.  Right Ear: External ear normal.  Left Ear: External ear normal.  Nose: Nose normal.  Mouth/Throat: Oropharynx is clear and moist. No oropharyngeal exudate.  TM within normal limits bilaterally.  Eyes: Conjunctivae are normal.  Neck: Neck supple.  Cardiovascular: Normal rate, regular rhythm, normal heart sounds and intact distal pulses.  Pulmonary/Chest: Effort normal and breath sounds normal. No respiratory distress. She has no wheezes. She has no rales. She exhibits no tenderness.  Lymphadenopathy:    She has no cervical adenopathy.  Neurological: She is alert and oriented to person, place, and time.  Skin: Skin is warm and dry. Rash noted. Rash is urticarial.  Vitals reviewed.  Recent Results (from the past 2160 hour(s))  POC Influenza A&B(BINAX/QUICKVUE)     Status: Normal   Collection Time: 03/01/18  9:22 AM  Result Value Ref Range   Influenza A, POC Negative Negative   Influenza B, POC Negative Negative   Assessment/Plan: 1. Hives Suspect 2/2 to Tessalon or Tamiflu started at the same time. Stop this. Continue Prednisone. Rx hydroxyzine. Close follow-up discussed for non-resolving symptoms. - hydrOXYzine (ATARAX/VISTARIL) 25 MG tablet; Take 1 tablet (25 mg total) by mouth 3 (three) times daily as needed.  Dispense: 30 tablet; Refill: 0  2. Mild persistent asthma with exacerbation Much improved. Finish entire course of prednisone. Will stop Tamiflu and Tessalon as she is much improved and has an urticarial rash likely from one of these medications. Supportive measures and OTC medications reviewed.   Leeanne Rio, PA-C

## 2018-08-08 ENCOUNTER — Ambulatory Visit: Payer: 59 | Admitting: Physician Assistant

## 2018-08-08 ENCOUNTER — Encounter: Payer: Self-pay | Admitting: Physician Assistant

## 2018-08-08 ENCOUNTER — Other Ambulatory Visit: Payer: Self-pay

## 2018-08-08 VITALS — BP 126/82 | HR 96 | Temp 98.4°F | Resp 16 | Ht <= 58 in | Wt 173.0 lb

## 2018-08-08 DIAGNOSIS — M2550 Pain in unspecified joint: Secondary | ICD-10-CM

## 2018-08-08 DIAGNOSIS — R350 Frequency of micturition: Secondary | ICD-10-CM | POA: Diagnosis not present

## 2018-08-08 DIAGNOSIS — R5382 Chronic fatigue, unspecified: Secondary | ICD-10-CM

## 2018-08-08 LAB — POCT URINALYSIS DIPSTICK
Bilirubin, UA: NEGATIVE
GLUCOSE UA: NEGATIVE
Ketones, UA: NEGATIVE
LEUKOCYTES UA: NEGATIVE
NITRITE UA: NEGATIVE
PROTEIN UA: NEGATIVE
Urobilinogen, UA: 0.2 E.U./dL
pH, UA: 6 (ref 5.0–8.0)

## 2018-08-08 MED ORDER — ALBUTEROL SULFATE HFA 108 (90 BASE) MCG/ACT IN AERS: 2.0000 | INHALATION_SPRAY | Freq: Four times a day (QID) | RESPIRATORY_TRACT | 0 refills | Status: DC | PRN

## 2018-08-08 NOTE — Addendum Note (Signed)
Addended by: Brunetta Jeans on: 08/08/2018 04:41 PM   Modules accepted: Orders

## 2018-08-08 NOTE — Patient Instructions (Signed)
Please go to the lab today for blood work.  I will call you with your results. We will alter treatment regimen(s) if indicated by your results.   Increase fluids.  Avoid caffeine as this can irritate the bladder. Since urine dip is normal we will wait on urine culture results before starting antibiotic.

## 2018-08-08 NOTE — Addendum Note (Signed)
Addended by: Anibal Henderson on: 08/08/2018 04:42 PM   Modules accepted: Orders

## 2018-08-08 NOTE — Progress Notes (Signed)
Patient presents to clinic today c/o fatigue over the past couple of months. Notes fatigue despite restful sleep at night. Denies fever but notes chills and cold intolerance. Patient denies chest pain, palpitations, lightheadedness, dizziness, vision changes or frequent headaches. Notes joint aches in her knees and hands bilaterally. States they sometime feel swollen. Denies depressed mood or anhedonia. Has history of anemia. Denies SOB.   Past Medical History:  Diagnosis Date  . Anemia   . Asthma    prn inhaler  . History of gestational hypertension   . Irregular menstrual cycle   . Menorrhagia   . Migraines   . Seasonal allergies   . Sickle cell trait ([place])     Current Outpatient Medications on File Prior to Visit  Medication Sig Dispense Refill  . albuterol (PROVENTIL HFA;VENTOLIN HFA) 108 (90 Base) MCG/ACT inhaler Inhale 2 puffs into the lungs every 6 (six) hours as needed. For wheeze or shortness of breath 60 g 0  . fluticasone (FLONASE) 50 MCG/ACT nasal spray Place 2 sprays into both nostrils daily. (Patient taking differently: Place 2 sprays into both nostrils daily as needed. ) 16 g 6  . ibuprofen (ADVIL,MOTRIN) 600 MG tablet Take 1 tablet (600 mg total) by mouth every 6 (six) hours as needed. 30 tablet 0  . letrozole (FEMARA) 2.5 MG tablet Take 2.5 mg by mouth daily.  10  . metoprolol tartrate (LOPRESSOR) 50 MG tablet Take 1 tablet (50 mg total) by mouth 2 (two) times daily. 60 tablet 5  . Multiple Vitamins-Minerals (WOMENS MULTIVITAMIN PLUS) TABS Take by mouth daily.    . norethindrone (AYGESTIN) 5 MG tablet Take 1 tablet by mouth daily.     . pantoprazole (PROTONIX) 40 MG tablet Take 1 tablet (40 mg total) by mouth daily. 30 tablet 3  . Cholecalciferol (VITAMIN D3) 400 units CAPS Take by mouth daily.    . Iron-FA-B Cmp-C-Biot-Probiotic (FUSION PLUS) CAPS Take 1 capsule by mouth daily.    Marland Kitchen leuprolide (LUPRON) 11.25 MG injection Inject 11.25 mg into the muscle every 3 (three)  months.     No current facility-administered medications on file prior to visit.     Allergies  Allergen Reactions  . Topamax [Topiramate]     Heart palpitations    Family History  Problem Relation Age of Onset  . Hypertension Mother   . Asthma Father   . Hypertension Father   . Asthma Brother   . Asthma Paternal Aunt   . Diabetes Paternal Aunt   . Cancer Maternal Grandfather        oral cancer  . Diabetes Maternal Grandfather   . Hypertension Maternal Grandfather     Social History   Socioeconomic History  . Marital status: Single    Spouse name: Not on file  . Number of children: 1  . Years of education: 66  . Highest education level: Not on file  Occupational History  . Occupation: Scientist, clinical (histocompatibility and immunogenetics): Engineer, water  Social Needs  . Financial resource strain: Not on file  . Food insecurity:    Worry: Not on file    Inability: Not on file  . Transportation needs:    Medical: Not on file    Non-medical: Not on file  Tobacco Use  . Smoking status: Never Smoker  . Smokeless tobacco: Never Used  Substance and Sexual Activity  . Alcohol use: Yes    Alcohol/week: 3.0 standard drinks    Types: 3 Glasses of wine per week  .  Drug use: No  . Sexual activity: Yes    Birth control/protection: None  Lifestyle  . Physical activity:    Days per week: Not on file    Minutes per session: Not on file  . Stress: Not on file  Relationships  . Social connections:    Talks on phone: Not on file    Gets together: Not on file    Attends religious service: Not on file    Active member of club or organization: Not on file    Attends meetings of clubs or organizations: Not on file    Relationship status: Not on file  Other Topics Concern  . Not on file  Social History Narrative   Lives with son (2010), employeed at [employer] part-time. Completed some college classes.    Review of Systems - See HPI.  All other ROS are negative.  BP 126/82   Pulse 96   Temp 98.4 F (36.9 C)  (Oral)   Resp 16   Ht '4\' 10"'  (1.473 m)   Wt 173 lb (78.5 kg)   SpO2 98%   BMI 36.16 kg/m   Physical Exam  Constitutional: She is oriented to person, place, and time. She appears well-developed and well-nourished.  HENT:  Head: Normocephalic and atraumatic.  Right Ear: External ear normal.  Left Ear: External ear normal.  Nose: Nose normal.  Mouth/Throat: Oropharynx is clear and moist.  Eyes: EOM are normal.  Neck: Neck supple.  Cardiovascular: Normal rate, regular rhythm, normal heart sounds and intact distal pulses.  Pulmonary/Chest: Effort normal and breath sounds normal. No stridor. No respiratory distress. She has no wheezes. She has no rales. She exhibits no tenderness.  Lymphadenopathy:    She has no cervical adenopathy.  Neurological: She is alert and oriented to person, place, and time.  Skin: Skin is warm and dry.  Psychiatric: She has a normal mood and affect.  Vitals reviewed.  Assessment/Plan: 1. Chronic fatigue Unclear etiology. Exam unremarkable. Will check lab panel today to further assess.  - TSH - T4, free - CBC w/Diff - Comp Met (CMET) - B12 and Folate Panel - Vitamin D 1,25 dihydroxy - Sedimentation rate - Rheumatoid Factor  2. Arthralgia, unspecified joint Exam unremarkable. Will check ESR and RF today. Supportive measures reviewed.   - Sedimentation rate - Rheumatoid Factor  3. Urinary frequency Urine dip negative for sign of infection. Trace blood but patient is spotting currently. Will send for culture giving history. - POCT urinalysis dipstick   Leeanne Rio, PA-C

## 2018-08-09 LAB — SEDIMENTATION RATE: SED RATE: 82 mm/h — AB (ref 0–20)

## 2018-08-09 LAB — COMPREHENSIVE METABOLIC PANEL
ALT: 9 U/L (ref 0–35)
AST: 12 U/L (ref 0–37)
Albumin: 4.3 g/dL (ref 3.5–5.2)
Alkaline Phosphatase: 84 U/L (ref 39–117)
BILIRUBIN TOTAL: 0.4 mg/dL (ref 0.2–1.2)
BUN: 9 mg/dL (ref 6–23)
CO2: 26 meq/L (ref 19–32)
Calcium: 9.7 mg/dL (ref 8.4–10.5)
Chloride: 101 mEq/L (ref 96–112)
Creatinine, Ser: 0.75 mg/dL (ref 0.40–1.20)
GFR: 115.51 mL/min (ref >60.00)
GLUCOSE: 154 mg/dL — AB (ref 70–99)
Potassium: 3.8 mEq/L (ref 3.5–5.1)
Sodium: 135 mEq/L (ref 135–145)
Total Protein: 7.6 g/dL (ref 6.0–8.3)

## 2018-08-09 LAB — CBC WITH DIFFERENTIAL/PLATELET
BASOS ABS: 0.1 10*3/uL (ref 0.0–0.1)
Basophils Relative: 0.6 % (ref 0.0–3.0)
EOS ABS: 0.1 10*3/uL (ref 0.0–0.7)
Eosinophils Relative: 1.4 % (ref 0.0–5.0)
HCT: 36 % (ref 36.0–46.0)
Hemoglobin: 11.8 g/dL — ABNORMAL LOW (ref 12.0–15.0)
LYMPHS ABS: 1.8 10*3/uL (ref 0.7–4.0)
Lymphocytes Relative: 19.1 % (ref 12.0–46.0)
MCHC: 32.8 g/dL (ref 30.0–36.0)
MCV: 76.5 fl — AB (ref 78.0–100.0)
MONOS PCT: 4.4 % (ref 3.0–12.0)
Monocytes Absolute: 0.4 10*3/uL (ref 0.1–1.0)
NEUTROS ABS: 7.1 10*3/uL (ref 1.4–7.7)
NEUTROS PCT: 74.5 % (ref 43.0–77.0)
PLATELETS: 466 10*3/uL — AB (ref 150.0–400.0)
RBC: 4.71 Mil/uL (ref 3.87–5.11)
RDW: 15.4 % (ref 11.5–15.5)
WBC: 9.6 10*3/uL (ref 4.0–10.5)

## 2018-08-09 LAB — B12 AND FOLATE PANEL
Folate: 16.2 ng/mL (ref >5.9)
Vitamin B-12: 792 pg/mL (ref 211–911)

## 2018-08-09 LAB — T4, FREE: Free T4: 0.82 ng/dL (ref 0.60–1.60)

## 2018-08-09 LAB — TSH: TSH: 0.61 u[IU]/mL (ref 0.35–4.50)

## 2018-08-10 ENCOUNTER — Other Ambulatory Visit: Payer: 59

## 2018-08-10 ENCOUNTER — Telehealth: Payer: Self-pay | Admitting: Physician Assistant

## 2018-08-10 DIAGNOSIS — R700 Elevated erythrocyte sedimentation rate: Secondary | ICD-10-CM

## 2018-08-10 LAB — URINE CULTURE
MICRO NUMBER:: 90953386
SPECIMEN QUALITY: ADEQUATE

## 2018-08-10 NOTE — Telephone Encounter (Signed)
Spoke with patient, advised her labs were normal except for Sed rate was elevated. Doubled from last year. Advised patient we added on a ANA and waiting on results and vitamin d level. Will contact from then and for the next steps. She is agreeable and still fatigue

## 2018-08-10 NOTE — Telephone Encounter (Signed)
Copied from [place] [phone] [id number]. Topic: Quick Communication - Lab Results >> Aug 10, 2018 11:10 AM Cecelia Byars, NT wrote: Patient called and would like her lab results  please call her at  [phone]

## 2018-08-10 NOTE — Telephone Encounter (Signed)
Last OV 08/08/18. Labs done on 08/08/18, waiting on ANA that was added on today.  Please advise for the rest of labs if possible.

## 2018-08-10 NOTE — Telephone Encounter (Signed)
See result note.  

## 2018-08-12 LAB — VITAMIN D 1,25 DIHYDROXY
Vitamin D 1, 25 (OH)2 Total: 78 pg/mL — ABNORMAL HIGH (ref 18–72)
Vitamin D2 1, 25 (OH)2: <8 pg/mL
Vitamin D3 1, 25 (OH)2: 78 pg/mL

## 2018-08-12 LAB — TEST AUTHORIZATION

## 2018-08-12 LAB — ANA
ANA: NEGATIVE
ANA: NEGATIVE

## 2018-08-12 LAB — RHEUMATOID FACTOR

## 2018-08-13 ENCOUNTER — Other Ambulatory Visit: Payer: Self-pay | Admitting: Physician Assistant

## 2018-08-31 ENCOUNTER — Other Ambulatory Visit: Payer: Self-pay | Admitting: Physician Assistant

## 2018-09-30 ENCOUNTER — Emergency Department (HOSPITAL_BASED_OUTPATIENT_CLINIC_OR_DEPARTMENT_OTHER)
Admission: EM | Admit: 2018-09-30 | Discharge: 2018-09-30 | Disposition: A | Discharge status: Home / Self Care | Payer: 59 | Attending: Emergency Medicine | Admitting: Emergency Medicine

## 2018-09-30 ENCOUNTER — Encounter (HOSPITAL_BASED_OUTPATIENT_CLINIC_OR_DEPARTMENT_OTHER): Payer: Self-pay | Admitting: *Deleted

## 2018-09-30 ENCOUNTER — Other Ambulatory Visit: Payer: Self-pay

## 2018-09-30 DIAGNOSIS — G43009 Migraine without aura, not intractable, without status migrainosus: Secondary | ICD-10-CM | POA: Insufficient documentation

## 2018-09-30 MED ORDER — DIPHENHYDRAMINE HCL 50 MG/ML IJ SOLN
25.0000 mg | Freq: Once | INTRAMUSCULAR | Status: AC
  Administered 2018-09-30: 25 mg via INTRAVENOUS
  Filled 2018-09-30: qty 1

## 2018-09-30 MED ORDER — KETOROLAC TROMETHAMINE 15 MG/ML IJ SOLN
15.0000 mg | Freq: Once | INTRAMUSCULAR | Status: AC
  Administered 2018-09-30: 15 mg via INTRAVENOUS
  Filled 2018-09-30: qty 1

## 2018-09-30 MED ORDER — DEXAMETHASONE SODIUM PHOSPHATE 4 MG/ML IJ SOLN
4.0000 mg | Freq: Once | INTRAMUSCULAR | Status: AC
  Administered 2018-09-30: 4 mg via INTRAVENOUS
  Filled 2018-09-30: qty 1

## 2018-09-30 MED ORDER — METOCLOPRAMIDE HCL 5 MG/ML IJ SOLN
10.0000 mg | Freq: Once | INTRAMUSCULAR | Status: AC
  Administered 2018-09-30: 10 mg via INTRAVENOUS
  Filled 2018-09-30: qty 2

## 2018-09-30 NOTE — ED Notes (Signed)
ED Provider at bedside. 

## 2018-09-30 NOTE — Discharge Instructions (Signed)
Please follow-up with your primary care doctor and/or neurologist.  Develop fevers, worsening symptoms, or have any concerns please seek additional medical care and evaluation.

## 2018-09-30 NOTE — ED Triage Notes (Signed)
Migraine today. She took Ibuprofen this am with no relief. She took Aleve 2 times today with no relief.

## 2018-09-30 NOTE — ED Provider Notes (Signed)
[facility] EMERGENCY DEPARTMENT Provider Note   CSN: [id number] Arrival date & time: 09/30/18  1947     History   Chief Complaint Chief Complaint  Patient presents with  . Migraine    HPI Kimberly Guerra is a 32 y.o. female with past medical history of sickle cell trait, endometriosis, asthma, migraines, who presents today for evaluation of a migraine headache.  She reports that she usually has migraine headaches and takes metoprolol daily to help prevent migraines.  She says that this feels like her usual bad migraines.  She has not had one like this in multiple years.  She reports that this started after she got out of the shower this morning.  No recent chiropractic adjustments fevers.  She reports light sensitivity, nausea without vomiting.  She has tried ibuprofen and Aleve at home without significant relief.  HPI  Past Medical History:  Diagnosis Date  . Anemia   . Asthma    prn inhaler  . History of gestational hypertension   . Irregular menstrual cycle   . Menorrhagia   . Migraines   . Seasonal allergies   . Sickle cell trait [facility])     Patient Active Problem List   Diagnosis Date Noted  . Endometriosis 09/28/2017  . Screen for STD (sexually transmitted disease) 08/14/2016  . Intractable chronic migraine without aura and without status migrainosus 10/07/2015  . Visit for preventive health examination 06/21/2015  . Encounter for cervical Pap smear with pelvic exam 06/21/2015  . Rhinitis, allergic 06/21/2015  . Migraines 02/08/2014    Past Surgical History:  Procedure Laterality Date  . CARPAL TUNNEL RELEASE Right 03/25/2015   Procedure:  ENDOSCOPIC CARPAL TUNNEL RELEASE;  Surgeon: Milly Jakob, MD;  Location: [facility];  Service: Orthopedics;  Laterality: Right;  . CESAREAN SECTION  01/02/2009  . DORSAL COMPARTMENT RELEASE Right 03/25/2015   Procedure: RIGHT WRIST DEQUERVAIN RELEASE AND ;  Surgeon: Milly Jakob, MD;  Location:  [facility];  Service: Orthopedics;  Laterality: Right;  . HYSTEROSCOPY W/D&C N/A 06/24/2017   Procedure: DILATATION AND CURETTAGE /HYSTEROSCOPY  pelvic biopsy and ablation of endometriosis;  Surgeon: Thurnell Lose, MD;  Location: [facility] ;  Service: Gynecology;  Laterality: N/A;  . LAPAROSCOPY N/A 06/24/2017   Procedure: LAPAROSCOPY DIAGNOSTIC;  Surgeon: Thurnell Lose, MD;  Location: [facility];  Service: Gynecology;  Laterality: N/A;  . WISDOM TOOTH EXTRACTION       OB History    Gravida  2   Para  1   Term  1   Preterm      AB  1   Living  1     SAB      TAB  1   Ectopic      Multiple      Live Births  1            Home Medications    Prior to Admission medications   Medication Sig Start Date End Date Taking? Authorizing Provider  Cholecalciferol (VITAMIN D3) 400 units CAPS Take by mouth daily.    [provider]  fluticasone (FLONASE) 50 MCG/ACT nasal spray Place 2 sprays into both nostrils daily. Patient taking differently: Place 2 sprays into both nostrils daily as needed.  06/21/15   Brunetta Jeans, PA-C  ibuprofen (ADVIL,MOTRIN) 600 MG tablet Take 1 tablet (600 mg total) by mouth every 6 (six) hours as needed. 06/24/17   Thurnell Lose, MD  Iron-FA-B Cmp-C-Biot-Probiotic (FUSION  PLUS) CAPS Take 1 capsule by mouth daily.    [provider]  letrozole (FEMARA) 2.5 MG tablet Take 2.5 mg by mouth daily. 07/18/18   [provider]  leuprolide (LUPRON) 11.25 MG injection Inject 11.25 mg into the muscle every 3 (three) months.    [provider]  metoprolol tartrate (LOPRESSOR) 50 MG tablet Take 1 tablet (50 mg total) by mouth 2 (two) times daily. 11/17/17   Brunetta Jeans, PA-C  Multiple Vitamins-Minerals (WOMENS MULTIVITAMIN PLUS) TABS Take by mouth daily.    [provider]  norethindrone (AYGESTIN) 5 MG tablet Take 1 tablet by mouth daily.  09/23/17   [provider]  pantoprazole (PROTONIX) 40 MG tablet TAKE 1 TABLET BY MOUTH EVERY DAY 08/15/18   Brunetta Jeans, PA-C  VENTOLIN HFA 108 [phone] Base) MCG/ACT inhaler INHALE 2 PUFFS INTO THE LUNGS EVERY 6 HOURS AS NEEDED FOR WHEEZE OR SHORTNESS OF BREATH 08/31/18   Brunetta Jeans, PA-C    Family History Family History  Problem Relation Age of Onset  . Hypertension Mother   . Asthma Father   . Hypertension Father   . Asthma Brother   . Asthma Paternal Aunt   . Diabetes Paternal Aunt   . Cancer Maternal Grandfather        oral cancer  . Diabetes Maternal Grandfather   . Hypertension Maternal Grandfather     Social History Social History   Tobacco Use  . Smoking status: Never Smoker  . Smokeless tobacco: Never Used  Substance Use Topics  . Alcohol use: Yes    Alcohol/week: 3.0 standard drinks    Types: 3 Glasses of wine per week  . Drug use: No     Allergies   Topamax [topiramate]   Review of Systems Review of Systems  Constitutional: Negative for chills and fever.  HENT: Negative for congestion and facial swelling.   Eyes: Positive for photophobia and visual disturbance (Mild blurry vision, is normal for her with her migraines.).  Respiratory: Negative for chest tightness and shortness of breath.   Genitourinary: Negative for dysuria and urgency.       Patient denies possibility of pregnancy.  We discussed potential risks of medications including Toradol if taken while pregnant, she still continues to deny stating she does not need a pregnancy test before toradol  Musculoskeletal: Negative for back pain, neck pain and neck stiffness.  Neurological: Positive for headaches. Negative for dizziness, weakness and light-headedness.  All other systems reviewed and are negative.    Physical Exam Updated Vital Signs BP 129/86 (BP Location: Right Arm)   Pulse 85   Temp 98.4 F (36.9 C) (Oral)   Resp 16   Ht 4\' 10"  (1.473 m)   Wt 76.2 kg   SpO2 100%   BMI 35.11 kg/m    Physical Exam  Constitutional: She is oriented to person, place, and time. She appears well-developed and well-nourished. No distress.  HENT:  Head: Normocephalic and atraumatic.  Eyes: Conjunctivae are normal.  Neck: Neck supple.  Cardiovascular: Normal rate and regular rhythm.  No murmur heard. Pulmonary/Chest: Effort normal and breath sounds normal. No respiratory distress.  Abdominal: Soft. There is no tenderness.  Musculoskeletal: She exhibits no edema.  Neurological: She is alert and oriented to person, place, and time.  Mental Status:  Alert, oriented, thought content appropriate, able to give a coherent history. Speech fluent without evidence of aphasia. Able to follow 2 step commands without difficulty.  Cranial Nerves:  II:  Peripheral visual fields grossly normal, pupils equal, round, reactive to light III,IV, VI: ptosis not present, extra-ocular motions intact bilaterally  V,VII: smile symmetric, facial light touch sensation equal VIII: hearing grossly normal to voice  X: uvula elevates symmetrically  XI: bilateral shoulder shrug symmetric and strong XII: midline tongue extension without fassiculations Motor:  Normal tone. 5/5 in upper and lower extremities bilaterally including strong and equal grip strength and dorsiflexion/plantar flexion Cerebellar: normal finger-to-nose with bilateral upper extremities Gait: normal gait and balance CV: distal pulses palpable throughout   Skin: Skin is warm and dry.  Psychiatric: She has a normal mood and affect.  Nursing note and vitals reviewed.    ED Treatments / Results  Labs (all labs ordered are listed, but only abnormal results are displayed) Labs Reviewed - No data to display  EKG None  Radiology No results found.  Procedures Procedures (including critical care time)  Medications Ordered in ED Medications  ketorolac (TORADOL) 15 MG/ML injection 15 mg (15 mg Intravenous Given 09/30/18 2055)  metoCLOPramide  (REGLAN) injection 10 mg (10 mg Intravenous Given 09/30/18 2055)  dexamethasone (DECADRON) injection 4 mg (4 mg Intravenous Given 09/30/18 2055)  diphenhydrAMINE (BENADRYL) injection 25 mg (25 mg Intravenous Given 09/30/18 2055)     Initial Impression / Assessment and Plan / ED Course  I have reviewed the triage vital signs and the nursing notes.  Pertinent labs & imaging results that were available during my care of the patient were reviewed by me and considered in my medical decision making (see chart for details).  Clinical Course as of Oct 05 0000  Fri Sep 30, 2018  2207 Patient reevaluated, her headache is down from a 9 to a 2, she is requesting discharge at this time.   [EH]    Clinical Course User Index [EH] Lorin Glass, PA-C   Pt HA treated and improved while in ED.  Presentation is like pts typical HA and non concerning for [facility], ICH, Meningitis, or temporal arteritis. Pt is afebrile with no focal neuro deficits, nuchal rigidity, or change in vision. Pt is to follow up with PCP to discuss recurrence of migraines.  Return precautions were discussed with patient who states their understanding.  At the time of discharge patient denied any unaddressed complaints or concerns.  Patient is agreeable for discharge home.   Final Clinical Impressions(s) / ED Diagnoses   Final diagnoses:  Migraine without aura and without status migrainosus, not intractable    ED Discharge Orders    None       Lorin Glass, PA-C 10/01/18 0001    Blanchie Dessert, MD 10/01/18 1512

## 2018-10-03 ENCOUNTER — Ambulatory Visit: Payer: 59 | Admitting: Physician Assistant

## 2018-10-03 ENCOUNTER — Encounter: Payer: Self-pay | Admitting: Physician Assistant

## 2018-10-03 ENCOUNTER — Encounter: Payer: Self-pay | Admitting: Emergency Medicine

## 2018-10-03 ENCOUNTER — Other Ambulatory Visit: Payer: Self-pay

## 2018-10-03 VITALS — BP 128/82 | HR 82 | Temp 98.4°F | Resp 14 | Ht <= 58 in | Wt 169.0 lb

## 2018-10-03 DIAGNOSIS — G43709 Chronic migraine without aura, not intractable, without status migrainosus: Secondary | ICD-10-CM

## 2018-10-03 MED ORDER — ELETRIPTAN HYDROBROMIDE 20 MG PO TABS: 20.0000 mg | ORAL_TABLET | Freq: Once | ORAL | 0 refills | Status: DC

## 2018-10-03 MED ORDER — KETOROLAC TROMETHAMINE 60 MG/2ML IM SOLN
60.0000 mg | Freq: Once | INTRAMUSCULAR | Status: AC
  Administered 2018-10-03: 30 mg via INTRAMUSCULAR

## 2018-10-03 NOTE — Patient Instructions (Signed)
Keep well-hydrated and get plenty of rest.  Stop the Ibuprofen.  The Relpax is for any recurrence of acute migraine headache. Continue Metoprolol as preventive.  The Toradol given should help get rid of current migraine.    Migraine Headache A migraine headache is a very strong throbbing pain on one side or both sides of your head. Migraines can also cause other symptoms. Talk with your doctor about what things may bring on (trigger) your migraine headaches. Follow these instructions at home: Medicines  Take over-the-counter and prescription medicines only as told by your doctor.  Do not drive or use heavy machinery while taking prescription pain medicine.  To prevent or treat constipation while you are taking prescription pain medicine, your doctor may recommend that you: ? Drink enough fluid to keep your pee (urine) clear or pale yellow. ? Take over-the-counter or prescription medicines. ? Eat foods that are high in fiber. These include fresh fruits and vegetables, whole grains, and beans. ? Limit foods that are high in fat and processed sugars. These include fried and sweet foods. Lifestyle  Avoid alcohol.  Do not use any products that contain nicotine or tobacco, such as cigarettes and e-cigarettes. If you need help quitting, ask your doctor.  Get at least 8 hours of sleep every night.  Limit your stress. General instructions   Keep a journal to find out what may bring on your migraines. For example, write down: ? What you eat and drink. ? How much sleep you get. ? Any change in what you eat or drink. ? Any change in your medicines.  If you have a migraine: ? Avoid things that make your symptoms worse, such as bright lights. ? It may help to lie down in a dark, quiet room. ? Do not drive or use heavy machinery. ? Ask your doctor what activities are safe for you.  Keep all follow-up visits as told by your doctor. This is important. Contact a doctor if:  You get a  migraine that is different or worse than your usual migraines. Get help right away if:  Your migraine gets very bad.  You have a fever.  You have a stiff neck.  You have trouble seeing.  Your muscles feel weak or like you cannot control them.  You start to lose your balance a lot.  You start to have trouble walking.  You pass out (faint). This information is not intended to replace advice given to you by your health care provider. Make sure you discuss any questions you have with your health care provider. Document Released: 09/22/2008 Document Revised: 07/03/2016 Document Reviewed: 06/01/2016 Elsevier Interactive Patient Education  2018 [employer].

## 2018-10-03 NOTE — Progress Notes (Signed)
Patient presents to clinic today for ER follow-up. Patient presented to ER on 09/30/18 with complaints of significant migraine headache. Workup included unremarkable examination. Patient was given Ketorolac, Reglan and Dexamethasone with improvement and discharged home to follow-up with PCP. Notes she has been continuing her Metoprolol for prophylaxis as directed. Prior to ER assessment had not had a headache in months. Notes mild recurrence of headache today. Is out of relpax. Denies any vision changes or AMS. States it feels like her regular migraines.  Past Medical History:  Diagnosis Date  . Anemia   . Asthma    prn inhaler  . History of gestational hypertension   . Irregular menstrual cycle   . Menorrhagia   . Migraines   . Seasonal allergies   . Sickle cell trait ([place])     Current Outpatient Medications on File Prior to Visit  Medication Sig Dispense Refill  . Cholecalciferol (VITAMIN D3) 400 units CAPS Take by mouth daily.    . fluticasone (FLONASE) 50 MCG/ACT nasal spray Place 2 sprays into both nostrils daily. (Patient taking differently: Place 2 sprays into both nostrils daily as needed. ) 16 g 6  . letrozole (FEMARA) 2.5 MG tablet Take 2.5 mg by mouth daily.  10  . metoprolol tartrate (LOPRESSOR) 50 MG tablet Take 1 tablet (50 mg total) by mouth 2 (two) times daily. 60 tablet 5  . Multiple Vitamins-Minerals (WOMENS MULTIVITAMIN PLUS) TABS Take by mouth daily.    . norethindrone (AYGESTIN) 5 MG tablet Take 1 tablet by mouth daily.     . pantoprazole (PROTONIX) 40 MG tablet TAKE 1 TABLET BY MOUTH EVERY DAY 90 tablet 1  . VENTOLIN HFA 108 (90 Base) MCG/ACT inhaler INHALE 2 PUFFS INTO THE LUNGS EVERY 6 HOURS AS NEEDED FOR WHEEZE OR SHORTNESS OF BREATH 18 Inhaler 0   No current facility-administered medications on file prior to visit.     Allergies  Allergen Reactions  . Topamax [Topiramate]     Heart palpitations    Family History  Problem Relation Age of Onset  .  Hypertension Mother   . Asthma Father   . Hypertension Father   . Asthma Brother   . Asthma Paternal Aunt   . Diabetes Paternal Aunt   . Cancer Maternal Grandfather        oral cancer  . Diabetes Maternal Grandfather   . Hypertension Maternal Grandfather     Social History   Socioeconomic History  . Marital status: Single    Spouse name: Not on file  . Number of children: 1  . Years of education: 6  . Highest education level: Not on file  Occupational History  . Occupation: Scientist, clinical (histocompatibility and immunogenetics): Engineer, water  Social Needs  . Financial resource strain: Not on file  . Food insecurity:    Worry: Not on file    Inability: Not on file  . Transportation needs:    Medical: Not on file    Non-medical: Not on file  Tobacco Use  . Smoking status: Never Smoker  . Smokeless tobacco: Never Used  Substance and Sexual Activity  . Alcohol use: Yes    Alcohol/week: 3.0 standard drinks    Types: 3 Glasses of wine per week  . Drug use: No  . Sexual activity: Yes    Birth control/protection: None  Lifestyle  . Physical activity:    Days per week: Not on file    Minutes per session: Not on file  . Stress: Not on  file  Relationships  . Social connections:    Talks on phone: Not on file    Gets together: Not on file    Attends religious service: Not on file    Active member of club or organization: Not on file    Attends meetings of clubs or organizations: Not on file    Relationship status: Not on file  Other Topics Concern  . Not on file  Social History Narrative   Lives with son (2010), employeed at [employer] part-time. Completed some college classes.    Review of Systems - See HPI.  All other ROS are negative.  BP 128/82   Pulse 82   Temp 98.4 F (36.9 C) (Oral)   Resp 14   Ht '4\' 10"'  (1.473 m)   Wt 169 lb (76.7 kg)   SpO2 98%   BMI 35.32 kg/m   Physical Exam  Constitutional: She is oriented to person, place, and time. She appears well-developed and well-nourished.  HENT:    Head: Normocephalic and atraumatic.  Right Ear: External ear normal.  Left Ear: External ear normal.  Nose: Nose normal.  Mouth/Throat: Oropharynx is clear and moist.  Eyes: Pupils are equal, round, and reactive to light. Conjunctivae are normal.  Neck: Neck supple.  Cardiovascular: Normal rate, regular rhythm and normal heart sounds.  Pulmonary/Chest: Effort normal and breath sounds normal.  Neurological: She is alert and oriented to person, place, and time.  Psychiatric: She has a normal mood and affect.  Vitals reviewed.   Recent Results (from the past 2160 hour(s))  POCT urinalysis dipstick     Status: Abnormal   Collection Time: 08/08/18  3:32 PM  Result Value Ref Range   Color, UA Yellow    Clarity, UA Clear    Glucose, UA Negative Negative   Bilirubin, UA Negative    Ketones, UA Negative    Spec Grav, UA >=1.030 (A) 1.010 - 1.025   Blood, UA +1 blood    pH, UA 6.0 5.0 - 8.0   Protein, UA Negative Negative   Urobilinogen, UA 0.2 0.2 or 1.0 E.U./dL   Nitrite, UA Negative    Leukocytes, UA Negative Negative   Appearance     Odor    TSH     Status: None   Collection Time: 08/08/18  3:40 PM  Result Value Ref Range   TSH 0.61 0.35 - 4.50 uIU/mL  T4, free     Status: None   Collection Time: 08/08/18  3:40 PM  Result Value Ref Range   Free T4 0.82 0.60 - 1.60 ng/dL    Comment: Specimens from patients who are undergoing biotin therapy and /or ingesting biotin supplements may contain high levels of biotin.  The higher biotin concentration in these specimens interferes with this Free T4 assay.  Specimens that contain high levels  of biotin may cause false high results for this Free T4 assay.  Please interpret results in light of the total clinical presentation of the patient.    CBC w/Diff     Status: Abnormal   Collection Time: 08/08/18  3:40 PM  Result Value Ref Range   WBC 9.6 4.0 - 10.5 K/uL   RBC 4.71 3.87 - 5.11 Mil/uL   Hemoglobin 11.8 (L) 12.0 - 15.0 g/dL   HCT  36.0 36.0 - 46.0 %   MCV 76.5 (L) 78.0 - 100.0 fl   MCHC 32.8 30.0 - 36.0 g/dL   RDW 15.4 11.5 - 15.5 %   Platelets 466.0 (  H) 150.0 - 400.0 K/uL   Neutrophils Relative % 74.5 43.0 - 77.0 %   Lymphocytes Relative 19.1 12.0 - 46.0 %   Monocytes Relative 4.4 3.0 - 12.0 %   Eosinophils Relative 1.4 0.0 - 5.0 %   Basophils Relative 0.6 0.0 - 3.0 %   Neutro Abs 7.1 1.4 - 7.7 K/uL   Lymphs Abs 1.8 0.7 - 4.0 K/uL   Monocytes Absolute 0.4 0.1 - 1.0 K/uL   Eosinophils Absolute 0.1 0.0 - 0.7 K/uL   Basophils Absolute 0.1 0.0 - 0.1 K/uL  Comp Met (CMET)     Status: Abnormal   Collection Time: 08/08/18  3:40 PM  Result Value Ref Range   Sodium 135 135 - 145 mEq/L   Potassium 3.8 3.5 - 5.1 mEq/L   Chloride 101 96 - 112 mEq/L   CO2 26 19 - 32 mEq/L   Glucose, Bld 154 (H) 70 - 99 mg/dL   BUN 9 6 - 23 mg/dL   Creatinine, Ser 0.75 0.40 - 1.20 mg/dL   Total Bilirubin 0.4 0.2 - 1.2 mg/dL   Alkaline Phosphatase 84 39 - 117 U/L   AST 12 0 - 37 U/L   ALT 9 0 - 35 U/L   Total Protein 7.6 6.0 - 8.3 g/dL   Albumin 4.3 3.5 - 5.2 g/dL   Calcium 9.7 8.4 - 10.5 mg/dL   GFR 115.51 >60.00 mL/min  B12 and Folate Panel     Status: None   Collection Time: 08/08/18  3:40 PM  Result Value Ref Range   Vitamin B-12 792 211 - 911 pg/mL   Folate 16.2 >5.9 ng/mL  Vitamin D 1,25 dihydroxy     Status: Abnormal   Collection Time: 08/08/18  3:40 PM  Result Value Ref Range   Vitamin D 1, 25 (OH)2 Total 78 (H) 18 - 72 pg/mL   Vitamin D3 1, 25 (OH)2 78 pg/mL   Vitamin D2 1, 25 (OH)2 <8 pg/mL    Comment: [facility] Vitamin D3, 1,25(OH)2 indicates both endogenous production and supplementation. Vitamin D2, 1,25(OH)2 is an indicator of exogenous sources, such as diet or supplementation.  Interpretation and therapy are based on measurement of Vitamin D,1,25(OH)2, Total. . . This test was developed and its analytical performance characteristics have been determined by [facility], [place], [place]. It  has not been cleared or approved by the FDA. This assay has been validated pursuant to the CLIA regulations and is used for clinical purposes. .   Sedimentation rate     Status: Abnormal   Collection Time: 08/08/18  3:40 PM  Result Value Ref Range   Sed Rate 82 (H) 0 - 20 mm/hr  Rheumatoid Factor     Status: None   Collection Time: 08/08/18  3:40 PM  Result Value Ref Range   Rhuematoid fact SerPl-aCnc <14 <14 IU/mL  ANA     Status: None   Collection Time: 08/08/18  3:40 PM  Result Value Ref Range   Anti Nuclear Antibody(ANA) NEGATIVE NEGATIVE    Comment: ANA IFA is a first line screen for detecting the presence of up to approximately 150 autoantibodies in various autoimmune diseases. A negative ANA IFA result suggests ANA-associated autoimmune diseases are not present at this time. . Visit Physician FAQs for interpretation of all antibodies in the [place], prevalence, and association with diseases at http://education.[facility].com/ [email or web address] .   TEST AUTHORIZATION     Status: None   Collection Time: 08/08/18  3:40 PM  Result Value  Ref Range   TEST NAME: ANA SCREEN, IFA, W/REFL    TEST CODE: 249SB    CLIENT CONTACT: FELICIA LANCASTER    REPORT ALWAYS MESSAGE SIGNATURE      Comment: . The laboratory testing on this patient was verbally requested or confirmed by the ordering physician or his or her authorized representative after contact with an employee of [employer]. Federal regulations require that we maintain on file written authorization for all laboratory testing.  Accordingly we are asking that the ordering physician or his or her authorized representative sign a copy of this report and promptly return it to the client service representative. . . Signature:____________________________________________________ . Please fax this signed page to [phone] or return it via your [employer] courier.   Urine Culture     Status: None    Collection Time: 08/08/18  4:42 PM  Result Value Ref Range   MICRO NUMBER: [id number]    SPECIMEN QUALITY: ADEQUATE    Sample Source URINE    STATUS: FINAL    Result:      Single organism less than 10,000 CFU/mL isolated. These organisms, commonly found on external and internal genitalia, are considered colonizers. No further testing performed.  ANA     Status: None   Collection Time: 08/10/18 11:40 AM  Result Value Ref Range   Anti Nuclear Antibody(ANA) NEGATIVE NEGATIVE    Comment: ANA IFA is a first line screen for detecting the presence of up to approximately 150 autoantibodies in various autoimmune diseases. A negative ANA IFA result suggests ANA-associated autoimmune diseases are not present at this time. . Visit Physician FAQs for interpretation of all antibodies in the [place], prevalence, and association with diseases at http://education.[facility].com/ VZC/HYI502 .     Assessment/Plan: 1. Chronic migraine without aura without status migrainosus, not intractable IM Toradol given in office today. Relpax refilled. Continue Metoprolol. Supportive measures reviewed. Follow-up if recurring again. - ketorolac (TORADOL) injection 60 mg   Leeanne Rio, [place]

## 2018-10-07 ENCOUNTER — Other Ambulatory Visit: Payer: Self-pay | Admitting: Physician Assistant

## 2018-11-28 ENCOUNTER — Encounter: Payer: Self-pay | Admitting: Physician Assistant

## 2018-12-26 ENCOUNTER — Other Ambulatory Visit: Payer: Self-pay

## 2018-12-26 ENCOUNTER — Encounter: Payer: Self-pay | Admitting: Physician Assistant

## 2018-12-26 ENCOUNTER — Other Ambulatory Visit (HOSPITAL_COMMUNITY)
Admission: RE | Admit: 2018-12-26 | Discharge: 2018-12-26 | Disposition: A | Discharge status: Home / Self Care | Payer: 59 | Source: Ambulatory Visit | Attending: Physician Assistant | Admitting: Physician Assistant

## 2018-12-26 ENCOUNTER — Ambulatory Visit: Payer: 59 | Admitting: Physician Assistant

## 2018-12-26 VITALS — BP 122/86 | HR 82 | Temp 98.2°F | Resp 14 | Ht <= 58 in | Wt 169.0 lb

## 2018-12-26 DIAGNOSIS — Z113 Encounter for screening for infections with a predominantly sexual mode of transmission: Secondary | ICD-10-CM | POA: Insufficient documentation

## 2018-12-26 DIAGNOSIS — L853 Xerosis cutis: Secondary | ICD-10-CM

## 2018-12-26 DIAGNOSIS — B373 Candidiasis of vulva and vagina: Secondary | ICD-10-CM

## 2018-12-26 DIAGNOSIS — R300 Dysuria: Secondary | ICD-10-CM | POA: Diagnosis not present

## 2018-12-26 DIAGNOSIS — R309 Painful micturition, unspecified: Secondary | ICD-10-CM | POA: Diagnosis not present

## 2018-12-26 DIAGNOSIS — B3731 Acute candidiasis of vulva and vagina: Secondary | ICD-10-CM

## 2018-12-26 LAB — POCT URINALYSIS DIPSTICK
Bilirubin, UA: NEGATIVE
Blood, UA: NEGATIVE
Glucose, UA: NEGATIVE
Ketones, UA: POSITIVE
LEUKOCYTES UA: NEGATIVE
NITRITE UA: POSITIVE
PH UA: 5 (ref 5.0–8.0)
Protein, UA: POSITIVE — AB
UROBILINOGEN UA: 0.2 U/dL

## 2018-12-26 LAB — TSH: TSH: 0.53 u[IU]/mL (ref 0.35–4.50)

## 2018-12-26 MED ORDER — CEPHALEXIN 500 MG PO CAPS: 500.0000 mg | ORAL_CAPSULE | Freq: Two times a day (BID) | ORAL | 0 refills | Status: AC

## 2018-12-26 MED ORDER — FLUCONAZOLE 150 MG PO TABS: 150.0000 mg | ORAL_TABLET | Freq: Once | ORAL | 0 refills | Status: AC

## 2018-12-26 NOTE — Patient Instructions (Signed)
Please go to the lab today for blood work.  I will call you with your results. We will alter treatment regimen(s) if indicated by your results.    Your symptoms are consistent with a bladder infection, also called acute cystitis. Please take your antibiotic (Keflex) as directed until all pills are gone.  Stay very well hydrated.  Consider a daily probiotic (Align, Culturelle, or Activia) to help prevent stomach upset caused by the antibiotic.  Taking a probiotic daily may also help prevent recurrent UTIs.  Also consider taking AZO (Phenazopyridine) tablets to help decrease pain with urination.  I will call you with your urine testing results.  We will change antibiotics if indicated.  Call or return to clinic if symptoms are not resolved by completion of antibiotic.   Take Diflucan as directed.  Urinary Tract Infection A urinary tract infection (UTI) can occur any place along the urinary tract. The tract includes the kidneys, ureters, bladder, and urethra. A type of germ called bacteria often causes a UTI. UTIs are often helped with antibiotic medicine.  HOME CARE   If given, take antibiotics as told by your doctor. Finish them even if you start to feel better.  Drink enough fluids to keep your pee (urine) clear or pale yellow.  Avoid tea, drinks with caffeine, and bubbly (carbonated) drinks.  Pee often. Avoid holding your pee in for a long time.  Pee before and after having sex (intercourse).  Wipe from front to back after you poop (bowel movement) if you are a woman. Use each tissue only once. GET HELP RIGHT AWAY IF:   You have back pain.  You have lower belly (abdominal) pain.  You have chills.  You feel sick to your stomach (nauseous).  You throw up (vomit).  Your burning or discomfort with peeing does not go away.  You have a fever.  Your symptoms are not better in 3 days. MAKE SURE YOU:   Understand these instructions.  Will watch your condition.  Will get help  right away if you are not doing well or get worse. Document Released: 06/01/2008 Document Revised: 09/07/2012 Document Reviewed: 07/14/2012 [facility] Patient Information 2015 [place], [place]. This information is not intended to replace advice given to you by your health care provider. Make sure you discuss any questions you have with your health care provider.

## 2018-12-26 NOTE — Progress Notes (Signed)
Patient presents to clinic today c/o 1 week of dysuria, urinary urgency, frequency and foul-smelling urine. Also notes mild vaginal itching without discharge. Denies change in hygiene products. Denies concern for STI but would like routine STI panel.  LMP finished 1.5 weeks ago. Is keeping well hydrated. Patient also notes itchy skin recently, all over. Notes some cold intolerance as well.   Past Medical History:  Diagnosis Date  . Anemia   . Asthma    prn inhaler  . History of gestational hypertension   . Irregular menstrual cycle   . Menorrhagia   . Migraines   . Seasonal allergies   . Sickle cell trait ([place])     Current Outpatient Medications on File Prior to Visit  Medication Sig Dispense Refill  . Cholecalciferol (VITAMIN D3) 400 units CAPS Take by mouth daily.    . metoprolol tartrate (LOPRESSOR) 50 MG tablet TAKE 1 TABLET BY MOUTH TWICE A DAY 180 tablet 1  . Multiple Vitamins-Minerals (WOMENS MULTIVITAMIN PLUS) TABS Take by mouth daily.    . pantoprazole (PROTONIX) 40 MG tablet TAKE 1 TABLET BY MOUTH EVERY DAY 90 tablet 1  . VENTOLIN HFA 108 (90 Base) MCG/ACT inhaler INHALE 2 PUFFS INTO THE LUNGS EVERY 6 HOURS AS NEEDED FOR WHEEZE OR SHORTNESS OF BREATH 18 Inhaler 0  . eletriptan (RELPAX) 20 MG tablet Take 1 tablet (20 mg total) by mouth once for 1 dose. May repeat in 2 hours if headache persists or recurs. 10 tablet 0  . letrozole (FEMARA) 2.5 MG tablet Take 2.5 mg by mouth daily.  10  . norethindrone (AYGESTIN) 5 MG tablet Take 1 tablet by mouth daily.      No current facility-administered medications on file prior to visit.     Allergies  Allergen Reactions  . Topamax [Topiramate]     Heart palpitations    Family History  Problem Relation Age of Onset  . Hypertension Mother   . Asthma Father   . Hypertension Father   . Asthma Brother   . Asthma Paternal Aunt   . Diabetes Paternal Aunt   . Cancer Maternal Grandfather        oral cancer  . Diabetes Maternal  Grandfather   . Hypertension Maternal Grandfather     Social History   Socioeconomic History  . Marital status: Single    Spouse name: Not on file  . Number of children: 1  . Years of education: 57  . Highest education level: Not on file  Occupational History  . Occupation: Scientist, clinical (histocompatibility and immunogenetics): Engineer, water  Social Needs  . Financial resource strain: Not on file  . Food insecurity:    Worry: Not on file    Inability: Not on file  . Transportation needs:    Medical: Not on file    Non-medical: Not on file  Tobacco Use  . Smoking status: Never Smoker  . Smokeless tobacco: Never Used  Substance and Sexual Activity  . Alcohol use: Yes    Alcohol/week: 3.0 standard drinks    Types: 3 Glasses of wine per week  . Drug use: No  . Sexual activity: Yes    Birth control/protection: None  Lifestyle  . Physical activity:    Days per week: Not on file    Minutes per session: Not on file  . Stress: Not on file  Relationships  . Social connections:    Talks on phone: Not on file    Gets together: Not on file  Attends religious service: Not on file    Active member of club or organization: Not on file    Attends meetings of clubs or organizations: Not on file    Relationship status: Not on file  Other Topics Concern  . Not on file  Social History Narrative   Lives with son (2010), employeed at [employer] part-time. Completed some college classes.    Review of Systems - See HPI.  All other ROS are negative.  BP 122/86   Pulse 82   Temp 98.2 F (36.8 C) (Oral)   Resp 14   Ht 4\' 10"  (1.473 m)   Wt 169 lb (76.7 kg)   SpO2 98%   BMI 35.32 kg/m   Physical Exam Vitals signs and nursing note reviewed.  Constitutional:      Appearance: Normal appearance.  HENT:     Head: Normocephalic.  Neck:     Musculoskeletal: Neck supple.  Cardiovascular:     Rate and Rhythm: Normal rate and regular rhythm.     Pulses: Normal pulses.     Heart sounds: Normal heart sounds.  Pulmonary:      Effort: Pulmonary effort is normal.     Breath sounds: Normal breath sounds.  Abdominal:     General: Bowel sounds are normal.     Palpations: Abdomen is soft.     Tenderness: There is no right CVA tenderness or left CVA tenderness.  Neurological:     Mental Status: She is alert.    Recent Results (from the past 2160 hour(s))  POCT Urinalysis Dipstick     Status: Abnormal   Collection Time: 12/26/18  2:40 PM  Result Value Ref Range   Color, UA dark yellow    Clarity, UA cloudy    Glucose, UA Negative Negative   Bilirubin, UA negative    Ketones, UA Positive    Spec Grav, UA >=1.030 (A) 1.010 - 1.025   Blood, UA Negative    pH, UA 5.0 5.0 - 8.0   Protein, UA Positive (A) Negative   Urobilinogen, UA 0.2 0.2 or 1.0 E.U./dL   Nitrite, UA Positive    Leukocytes, UA Negative Negative   Appearance     Odor      Assessment/Plan: 1. Dysuria Urine dip with + nitrites and blood. Has not eating since yesterday afternoon which is likely causing trace ketone. Will send for culture. Start empiric Keflex. Increase fluids. Well-balanced diet and probiotic recommended. Will alter regimen based on culture results and response to treatment. - POCT Urinalysis Dipstick - cephALEXin (KEFLEX) 500 MG capsule; Take 1 capsule (500 mg total) by mouth 2 (two) times daily for 7 days.  Dispense: 14 capsule; Refill: 0 - Urine Culture  2. Screen for STD (sexually transmitted disease) Asymptomatic. Just wants routine screen - HIV Antibody (routine testing w rflx) - [facility] - Urine cytology ancillary only([place]) - HSV(herpes smplx)abs-1+2(IgG+IgM)-bld  3. Yeast vaginitis + history. Symptoms currently seem consistent with a developing yeast vag. Rx Diflucan 150 mg x 1. - fluconazole (DIFLUCAN) 150 MG tablet; Take 1 tablet (150 mg total) by mouth once for 1 dose.  Dispense: 1 tablet; Refill: 0  4. Dry skin Will check TSH today.  Moisturizing lotions recommended. - TSH   Leeanne Rio, PA-C

## 2018-12-28 LAB — HSV(HERPES SMPLX)ABS-I+II(IGG+IGM)-BLD
HSV 1 Glycoprotein G Ab, IgG: 42 index — ABNORMAL HIGH (ref 0.00–0.90)
HSV 2 IgG, Type Spec: <0.91 index (ref 0.00–0.90)
HSVI/II Comb IgM: <0.91 Ratio (ref 0.00–0.90)

## 2018-12-29 LAB — URINE CULTURE
MICRO NUMBER:: 91551910
SPECIMEN QUALITY:: ADEQUATE

## 2018-12-29 LAB — RPR: RPR Ser Ql: NONREACTIVE

## 2018-12-29 LAB — HIV ANTIBODY (ROUTINE TESTING W REFLEX): HIV 1&2 Ab, 4th Generation: NONREACTIVE

## 2018-12-30 LAB — URINE CYTOLOGY ANCILLARY ONLY
BACTERIAL VAGINITIS: NEGATIVE
Candida vaginitis: NEGATIVE
Chlamydia: NEGATIVE
Neisseria Gonorrhea: NEGATIVE
TRICH (WINDOWPATH): NEGATIVE

## 2019-01-04 ENCOUNTER — Encounter: Payer: Self-pay | Admitting: Physician Assistant

## 2019-01-04 MED ORDER — FLUCONAZOLE 150 MG PO TABS: 150.0000 mg | ORAL_TABLET | Freq: Once | ORAL | 0 refills | Status: AC

## 2019-01-09 ENCOUNTER — Encounter: Payer: Self-pay | Admitting: Physician Assistant

## 2019-01-09 ENCOUNTER — Ambulatory Visit: Payer: 59 | Admitting: Physician Assistant

## 2019-01-09 ENCOUNTER — Encounter: Payer: Self-pay | Admitting: Emergency Medicine

## 2019-01-09 ENCOUNTER — Other Ambulatory Visit: Payer: Self-pay

## 2019-01-09 VITALS — BP 118/80 | HR 72 | Temp 98.4°F | Resp 16 | Ht <= 58 in | Wt 168.0 lb

## 2019-01-09 DIAGNOSIS — H6502 Acute serous otitis media, left ear: Secondary | ICD-10-CM | POA: Diagnosis not present

## 2019-01-09 DIAGNOSIS — G500 Trigeminal neuralgia: Secondary | ICD-10-CM

## 2019-01-09 MED ORDER — LORATADINE-PSEUDOEPHEDRINE ER 5-120 MG PO TB12: 1.0000 | ORAL_TABLET | Freq: Two times a day (BID) | ORAL | 0 refills | Status: DC

## 2019-01-09 MED ORDER — FLUTICASONE PROPIONATE 50 MCG/ACT NA SUSP: 2.0000 | Freq: Every day | NASAL | 0 refills | Status: DC

## 2019-01-09 MED ORDER — METHYLPREDNISOLONE 4 MG PO TBPK: ORAL_TABLET | ORAL | 0 refills | Status: DC

## 2019-01-09 NOTE — Progress Notes (Signed)
Acute Office Visit  Subjective:    Patient ID: Kimberly Guerra, female    DOB: 01-Feb-1986, 33 y.o.   MRN: [id number]  Chief Complaint  Patient presents with  . Otalgia    Patient is in today for pain of L ear described as sharp and associated with tinnitus and ear pressure. Symptoms have been present for 3.5 days. Notes some R ear pain that is milder. None currently. Denies nasal congestion, fever, chills. Denies chest congestion. Notes facial pain and sensitivity. Denies lesion. Denies facial numbness or tingling.  Past Medical History:  Diagnosis Date  . Anemia   . Asthma    prn inhaler  . History of gestational hypertension   . Irregular menstrual cycle   . Menorrhagia   . Migraines   . Seasonal allergies   . Sickle cell trait [facility])     Past Surgical History:  Procedure Laterality Date  . CARPAL TUNNEL RELEASE Right 03/25/2015   Procedure:  ENDOSCOPIC CARPAL TUNNEL RELEASE;  Surgeon: Milly Jakob, MD;  Location: [place];  Service: Orthopedics;  Laterality: Right;  . CESAREAN SECTION  01/02/2009  . DORSAL COMPARTMENT RELEASE Right 03/25/2015   Procedure: RIGHT WRIST DEQUERVAIN RELEASE AND ;  Surgeon: Milly Jakob, MD;  Location: [place];  Service: Orthopedics;  Laterality: Right;  . HYSTEROSCOPY W/D&C N/A 06/24/2017   Procedure: DILATATION AND CURETTAGE /HYSTEROSCOPY  pelvic biopsy and ablation of endometriosis;  Surgeon: Thurnell Lose, MD;  Location: [facility];  Service: Gynecology;  Laterality: N/A;  . LAPAROSCOPY N/A 06/24/2017   Procedure: LAPAROSCOPY DIAGNOSTIC;  Surgeon: Thurnell Lose, MD;  Location: [facility];  Service: Gynecology;  Laterality: N/A;  . WISDOM TOOTH EXTRACTION      Family History  Problem Relation Age of Onset  . Hypertension Mother   . Asthma Father   . Hypertension Father   . Asthma Brother   . Asthma Paternal Aunt   . Diabetes Paternal Aunt   . Cancer Maternal  Grandfather        oral cancer  . Diabetes Maternal Grandfather   . Hypertension Maternal Grandfather     Social History   Socioeconomic History  . Marital status: Single    Spouse name: Not on file  . Number of children: 1  . Years of education: 74  . Highest education level: Not on file  Occupational History  . Occupation: Scientist, clinical (histocompatibility and immunogenetics): Engineer, water  Social Needs  . Financial resource strain: Not on file  . Food insecurity:    Worry: Not on file    Inability: Not on file  . Transportation needs:    Medical: Not on file    Non-medical: Not on file  Tobacco Use  . Smoking status: Never Smoker  . Smokeless tobacco: Never Used  Substance and Sexual Activity  . Alcohol use: Yes    Alcohol/week: 3.0 standard drinks    Types: 3 Glasses of wine per week  . Drug use: No  . Sexual activity: Yes    Birth control/protection: None  Lifestyle  . Physical activity:    Days per week: Not on file    Minutes per session: Not on file  . Stress: Not on file  Relationships  . Social connections:    Talks on phone: Not on file    Gets together: Not on file    Attends religious service: Not on file    Active member of club or organization: Not on  file    Attends meetings of clubs or organizations: Not on file    Relationship status: Not on file  . Intimate partner violence:    Fear of current or ex partner: Not on file    Emotionally abused: Not on file    Physically abused: Not on file    Forced sexual activity: Not on file  Other Topics Concern  . Not on file  Social History Narrative   Lives with son (2010), employeed at [employer] part-time. Completed some college classes.     Outpatient Medications Prior to Visit  Medication Sig Dispense Refill  . Cholecalciferol (VITAMIN D3) 400 units CAPS Take by mouth daily.    . Elagolix Sodium (ORILISSA) 200 MG TABS Take 1 tablet by mouth 2 (two) times daily.    Marland Kitchen eletriptan (RELPAX) 20 MG tablet Take 1 tablet (20 mg total) by mouth  once for 1 dose. May repeat in 2 hours if headache persists or recurs. 10 tablet 0  . metoprolol tartrate (LOPRESSOR) 50 MG tablet TAKE 1 TABLET BY MOUTH TWICE A DAY 180 tablet 1  . Multiple Vitamins-Minerals (WOMENS MULTIVITAMIN PLUS) TABS Take by mouth daily.    . pantoprazole (PROTONIX) 40 MG tablet TAKE 1 TABLET BY MOUTH EVERY DAY 90 tablet 1  . VENTOLIN HFA 108 (90 Base) MCG/ACT inhaler INHALE 2 PUFFS INTO THE LUNGS EVERY 6 HOURS AS NEEDED FOR WHEEZE OR SHORTNESS OF BREATH 18 Inhaler 0  . letrozole (FEMARA) 2.5 MG tablet Take 2.5 mg by mouth daily.  10  . norethindrone (AYGESTIN) 5 MG tablet Take 1 tablet by mouth daily.      No facility-administered medications prior to visit.     Allergies  Allergen Reactions  . Topamax [Topiramate]     Heart palpitations    Review of Systems  Constitutional: Positive for malaise/fatigue. Negative for chills and fever.  HENT: Positive for ear pain and tinnitus. Negative for congestion, ear discharge, hearing loss, sinus pain and sore throat.   Respiratory: Negative for cough, sputum production, shortness of breath and wheezing.   Musculoskeletal: Negative for myalgias and neck pain.  Neurological: Positive for headaches. Negative for dizziness.       Objective:    Physical Exam  Constitutional: She appears well-developed and well-nourished.  HENT:  Left Ear: Hearing normal. There is drainage and tenderness (Tragal tenderness ).  Neck: Trachea normal.    Cardiovascular: Normal rate, regular rhythm and normal heart sounds.  Pulmonary/Chest: Effort normal and breath sounds normal.    BP 118/80   Pulse 72   Temp 98.4 F (36.9 C) (Oral)   Resp 16   Ht 4\' 10"  (1.473 m)   Wt 76.2 kg   SpO2 98%   BMI 35.11 kg/m  Wt Readings from Last 3 Encounters:  01/09/19 76.2 kg  12/26/18 76.7 kg  10/03/18 76.7 kg    Health Maintenance Due  Topic Date Due  . DTaP/Tdap/Td (2 - Td) 01/02/2019  . TETANUS/TDAP  01/02/2019    There are no  preventive care reminders to display for this patient.   Lab Results  Component Value Date   TSH 0.53 12/26/2018   Lab Results  Component Value Date   WBC 9.6 08/08/2018   HGB 11.8 (L) 08/08/2018   HCT 36.0 08/08/2018   MCV 76.5 (L) 08/08/2018   PLT 466.0 (H) 08/08/2018   Lab Results  Component Value Date   NA 135 08/08/2018   K 3.8 08/08/2018   CO2 26 08/08/2018  GLUCOSE 154 (H) 08/08/2018   BUN 9 08/08/2018   CREATININE 0.75 08/08/2018   BILITOT 0.4 08/08/2018   ALKPHOS 84 08/08/2018   AST 12 08/08/2018   ALT 9 08/08/2018   PROT 7.6 08/08/2018   ALBUMIN 4.3 08/08/2018   CALCIUM 9.7 08/08/2018   ANIONGAP 6 06/15/2017   GFR 115.51 08/08/2018   Lab Results  Component Value Date   CHOL 154 09/28/2017   Lab Results  Component Value Date   HDL 59.10 09/28/2017   Lab Results  Component Value Date   LDLCALC 87 09/28/2017   Lab Results  Component Value Date   TRIG 42.0 09/28/2017   Lab Results  Component Value Date   CHOLHDL 3 09/28/2017   No results found for: HGBA1C     Assessment & Plan:   1. Non-recurrent acute serous otitis media of left ear Start Flonase and Claritin-D. Giving severity of retraction will start oral steroid taper. Return precautions reviewed with patient.  - fluticasone (FLONASE) 50 MCG/ACT nasal spray; Place 2 sprays into both nostrils daily.  Dispense: 16 g; Refill: 0 - loratadine-pseudoephedrine (CLARITIN-D 12 HOUR) 5-120 MG tablet; Take 1 tablet by mouth 2 (two) times daily.  Dispense: 30 tablet; Refill: 0 - methylPREDNISolone (MEDROL DOSEPAK) 4 MG TBPK tablet; Take following package directions.  Dispense: 21 tablet; Refill: 0  2. Trigeminal neuralgia pain 2/2 serous otitis and inflammation. Mild symptoms. Steroid pack to calm down acute inflammation. May need to consider Tegretol if not easing up soon. - methylPREDNISolone (MEDROL DOSEPAK) 4 MG TBPK tablet; Take following package directions.  Dispense: 21 tablet; Refill: 0   No  orders of the defined types were placed in this encounter.    Erin E Mecum, Student-PA

## 2019-01-09 NOTE — Patient Instructions (Signed)
Please keep well-hydrated and get plenty of rest.  Start the Flonase and Claritin-D as directed. Take the Medrol dose pack as directed.  Put a humidifier in the bedroom. Tylenol for breakthrough pain. Follow-up with me if symptoms are not easing up in 48-72 hours.

## 2019-01-24 ENCOUNTER — Ambulatory Visit (INDEPENDENT_AMBULATORY_CARE_PROVIDER_SITE_OTHER): Payer: 59 | Admitting: Physician Assistant

## 2019-01-24 ENCOUNTER — Other Ambulatory Visit: Payer: Self-pay

## 2019-01-24 ENCOUNTER — Encounter: Payer: Self-pay | Admitting: Emergency Medicine

## 2019-01-24 ENCOUNTER — Encounter: Payer: Self-pay | Admitting: Physician Assistant

## 2019-01-24 VITALS — BP 130/80 | HR 80 | Temp 97.9°F | Resp 16 | Ht <= 58 in | Wt 168.0 lb

## 2019-01-24 DIAGNOSIS — J9801 Acute bronchospasm: Secondary | ICD-10-CM | POA: Diagnosis not present

## 2019-01-24 DIAGNOSIS — R058 Other specified cough: Secondary | ICD-10-CM

## 2019-01-24 DIAGNOSIS — R05 Cough: Secondary | ICD-10-CM

## 2019-01-24 MED ORDER — HYDROCOD POLST-CPM POLST ER 10-8 MG/5ML PO SUER: 5.0000 mL | Freq: Two times a day (BID) | ORAL | 0 refills | Status: DC | PRN

## 2019-01-24 MED ORDER — IPRATROPIUM-ALBUTEROL 0.5-2.5 (3) MG/3ML IN SOLN
3.0000 mL | Freq: Once | RESPIRATORY_TRACT | Status: AC
  Administered 2019-01-24: 3 mL via RESPIRATORY_TRACT

## 2019-01-24 MED ORDER — PREDNISONE 20 MG PO TABS: 40.0000 mg | ORAL_TABLET | Freq: Every day | ORAL | 0 refills | Status: DC

## 2019-01-24 NOTE — Patient Instructions (Signed)
Please keep hydrated and get plenty of rest.  Continue chronic medications. Start the Tussionex and prednisone as directed.  Symptoms should begin to let up.  It is ok for you to work if you are afebrile.

## 2019-01-24 NOTE — Progress Notes (Signed)
Patient presents to clinic today c/o persistent cough after cold. Notes her son recently recovered from viral URI. She started with similar symptoms last Wed. Notes body aches, chills, fever, diarrhea, vomiting have all subsided. Just cough is lingering. Endorses post nasal drainage.  Pt is using inhaler, mucinex and dayquil but still persistent cough. Had dayquil yesterday.    Past Medical History:  Diagnosis Date  . Anemia   . Asthma    prn inhaler  . History of gestational hypertension   . Irregular menstrual cycle   . Menorrhagia   . Migraines   . Seasonal allergies   . Sickle cell trait ([place])     Current Outpatient Medications on File Prior to Visit  Medication Sig Dispense Refill  . Cholecalciferol (VITAMIN D3) 400 units CAPS Take by mouth daily.    . Elagolix Sodium (ORILISSA) 200 MG TABS Take 1 tablet by mouth 2 (two) times daily.    Marland Kitchen eletriptan (RELPAX) 20 MG tablet Take 1 tablet (20 mg total) by mouth once for 1 dose. May repeat in 2 hours if headache persists or recurs. 10 tablet 0  . fluticasone (FLONASE) 50 MCG/ACT nasal spray Place 2 sprays into both nostrils daily. 16 g 0  . letrozole (FEMARA) 2.5 MG tablet Take 2.5 mg by mouth daily.  10  . loratadine-pseudoephedrine (CLARITIN-D 12 HOUR) 5-120 MG tablet Take 1 tablet by mouth 2 (two) times daily. 30 tablet 0  . metoprolol tartrate (LOPRESSOR) 50 MG tablet TAKE 1 TABLET BY MOUTH TWICE A DAY 180 tablet 1  . Multiple Vitamins-Minerals (WOMENS MULTIVITAMIN PLUS) TABS Take by mouth daily.    . norethindrone (AYGESTIN) 5 MG tablet Take 1 tablet by mouth daily.     . pantoprazole (PROTONIX) 40 MG tablet TAKE 1 TABLET BY MOUTH EVERY DAY 90 tablet 1  . VENTOLIN HFA 108 (90 Base) MCG/ACT inhaler INHALE 2 PUFFS INTO THE LUNGS EVERY 6 HOURS AS NEEDED FOR WHEEZE OR SHORTNESS OF BREATH 18 Inhaler 0   No current facility-administered medications on file prior to visit.     Allergies  Allergen Reactions  . Topamax [Topiramate]      Heart palpitations    Family History  Problem Relation Age of Onset  . Hypertension Mother   . Asthma Father   . Hypertension Father   . Asthma Brother   . Asthma Paternal Aunt   . Diabetes Paternal Aunt   . Cancer Maternal Grandfather        oral cancer  . Diabetes Maternal Grandfather   . Hypertension Maternal Grandfather     Social History   Socioeconomic History  . Marital status: Single    Spouse name: Not on file  . Number of children: 1  . Years of education: 54  . Highest education level: Not on file  Occupational History  . Occupation: Scientist, clinical (histocompatibility and immunogenetics): Engineer, water  Social Needs  . Financial resource strain: Not on file  . Food insecurity:    Worry: Not on file    Inability: Not on file  . Transportation needs:    Medical: Not on file    Non-medical: Not on file  Tobacco Use  . Smoking status: Never Smoker  . Smokeless tobacco: Never Used  Substance and Sexual Activity  . Alcohol use: Yes    Alcohol/week: 3.0 standard drinks    Types: 3 Glasses of wine per week  . Drug use: No  . Sexual activity: Yes    Birth control/protection:  None  Lifestyle  . Physical activity:    Days per week: Not on file    Minutes per session: Not on file  . Stress: Not on file  Relationships  . Social connections:    Talks on phone: Not on file    Gets together: Not on file    Attends religious service: Not on file    Active member of club or organization: Not on file    Attends meetings of clubs or organizations: Not on file    Relationship status: Not on file  Other Topics Concern  . Not on file  Social History Narrative   Lives with son (2010), employeed at [employer] part-time. Completed some college classes.     Review of Systems - See HPI.  All other ROS are negative.  BP 130/80   Pulse 80   Temp 97.9 F (36.6 C) (Oral)   Resp 16   Ht 4\' 10"  (1.473 m)   Wt 168 lb (76.2 kg)   SpO2 99%   BMI 35.11 kg/m   Physical Exam Constitutional:      Appearance:  Normal appearance.  HENT:     Head: Normocephalic and atraumatic.     Right Ear: Tympanic membrane normal.     Left Ear: Tympanic membrane normal.     Nose: Nose normal.     Mouth/Throat:     Mouth: Mucous membranes are moist.  Eyes:     Conjunctiva/sclera: Conjunctivae normal.  Cardiovascular:     Rate and Rhythm: Normal rate and regular rhythm.     Heart sounds: Normal heart sounds.  Pulmonary:     Effort: Pulmonary effort is normal.     Breath sounds: Normal breath sounds.  Neurological:     Mental Status: She is alert.     Recent Results (from the past 2160 hour(s))  Urine cytology ancillary only([place])     Status: None   Collection Time: 12/26/18 12:00 AM  Result Value Ref Range   Chlamydia Negative     Comment: Normal Reference Range - Negative   Neisseria gonorrhea Negative     Comment: Normal Reference Range - Negative   Trichomonas Negative     Comment: Normal Reference Range - Negative  Urine cytology ancillary only     Status: None   Collection Time: 12/26/18 12:00 AM  Result Value Ref Range   Bacterial vaginitis Negative for Bacterial Vaginitis Microorganisms     Comment: Normal Reference Range - Negative   Candida vaginitis Negative for Candida Vaginitis Microorganisms     Comment: Normal Reference Range - Negative  POCT Urinalysis Dipstick     Status: Abnormal   Collection Time: 12/26/18  2:40 PM  Result Value Ref Range   Color, UA dark yellow    Clarity, UA cloudy    Glucose, UA Negative Negative   Bilirubin, UA negative    Ketones, UA Positive    Spec Grav, UA >=1.030 (A) 1.010 - 1.025   Blood, UA Negative    pH, UA 5.0 5.0 - 8.0   Protein, UA Positive (A) Negative   Urobilinogen, UA 0.2 0.2 or 1.0 E.U./dL   Nitrite, UA Positive    Leukocytes, UA Negative Negative   Appearance     Odor    HIV Antibody (routine testing w rflx)     Status: None   Collection Time: 12/26/18  2:53 PM  Result Value Ref Range   HIV 1&2 Ab, 4th Generation  NON-REACTIVE NON-REACTI  Comment: HIV-1 antigen and HIV-1/HIV-2 antibodies were not detected. There is no laboratory evidence of HIV infection. Marland Kitchen PLEASE NOTE: This information has been disclosed to you from records whose confidentiality may be protected by state law.  If your state requires such protection, then the state law prohibits you from making any further disclosure of the information without the specific written consent of the person to whom it pertains, or as otherwise permitted by law. A general authorization for the release of medical or other information is NOT sufficient for this purpose. . For additional information please refer to http://education.questdiagnostics.com/faq/FAQ106 (This link is being provided for informational/ educational purposes only.) . Marland Kitchen The performance of this assay has not been clinically validated in patients less than 80 years old. .   RPR     Status: None   Collection Time: 12/26/18  2:53 PM  Result Value Ref Range   RPR Ser Ql NON-REACTIVE NON-REACTI  Urine Culture     Status: Abnormal   Collection Time: 12/26/18  2:53 PM  Result Value Ref Range   MICRO NUMBER: [id number]    SPECIMEN QUALITY: Adequate    Sample Source NOT GIVEN    STATUS: FINAL    ISOLATE 1: Escherichia coli (A)     Comment: Greater than 100,000 CFU/mL of Escherichia coli      Susceptibility   Escherichia coli - URINE CULTURE, REFLEX    AMOX/CLAVULANIC <=2 Sensitive     AMPICILLIN <=2 Sensitive     AMPICILLIN/SULBACTAM <=2 Sensitive     CEFAZOLIN* <=4 Not Reportable      * For infections other than uncomplicated UTIcaused by E. coli, K. pneumoniae or P. mirabilis:Cefazolin is resistant if MIC > or = 8 mcg/mL.(Distinguishing susceptible versus intermediatefor isolates with MIC < or = 4 mcg/mL requiresadditional testing.)For uncomplicated UTI caused by E. coli,K. pneumoniae or P. mirabilis: Cefazolin issusceptible if MIC <32 mcg/mL and predictssusceptible to the oral  agents cefaclor, cefdinir,cefpodoxime, cefprozil, cefuroxime, cephalexinand loracarbef.    CEFEPIME <=1 Sensitive     CEFTRIAXONE <=1 Sensitive     CIPROFLOXACIN <=0.25 Sensitive     LEVOFLOXACIN <=0.12 Sensitive     ERTAPENEM <=0.5 Sensitive     GENTAMICIN <=1 Sensitive     IMIPENEM <=0.25 Sensitive     NITROFURANTOIN 32 Sensitive     TOBRAMYCIN <=1 Sensitive     TRIMETH/SULFA* <=20 Sensitive      * For infections other than uncomplicated UTIcaused by E. coli, K. pneumoniae or P. mirabilis:Cefazolin is resistant if MIC > or = 8 mcg/mL.(Distinguishing susceptible versus intermediatefor isolates with MIC < or = 4 mcg/mL requiresadditional testing.)For uncomplicated UTI caused by E. coli,K. pneumoniae or P. mirabilis: Cefazolin issusceptible if MIC <32 mcg/mL and predictssusceptible to the oral agents cefaclor, cefdinir,cefpodoxime, cefprozil, cefuroxime, cephalexinand loracarbef.Legend:S = Susceptible  I = IntermediateR = Resistant  NS = Not susceptible* = Not tested  NR = Not reported**NN = See antimicrobic comments  TSH     Status: None   Collection Time: 12/26/18  2:53 PM  Result Value Ref Range   TSH 0.53 0.35 - 4.50 uIU/mL  HSV(herpes smplx)abs-1+2(IgG+IgM)-bld     Status: Abnormal   Collection Time: 12/26/18  2:55 PM  Result Value Ref Range   HSVI/II Comb IgM <0.91 0.00 - 0.90 Ratio    Comment:  Negative        <0.91                                  Equivocal 0.91 - 1.09                                  Positive        >1.09    HSV 1 Glycoprotein G Ab, IgG 42.00 (H) 0.00 - 0.90 index    Comment:                                  Negative        <0.91                                  Equivocal 0.91 - 1.09                                  Positive        >1.09  Note: Negative indicates no antibodies detected to  HSV-1. Equivocal may suggest early infection.  If  clinically appropriate, retest at later date. Positive  indicates antibodies detected to  HSV-1.    HSV 2 IgG, Type Spec <0.91 0.00 - 0.90 index    Comment:                                  Negative        <0.91                                  Equivocal 0.91 - 1.09                                  Positive        >1.09  Note: Negative indicates no antibodies detected to  HSV-2. Equivocal may suggest early infection.  If  clinically appropriate, retest at later date. Positive  indicates antibodies detected to HSV-2.     Assessment/Plan: 1. Post-viral cough syndrome 2. Bronchospasm Duoneb given today with improvement in chest tightness. Lungs CTAB. Postviral cough syndrome and bronchospasm suspected. No sign of bacterial infection warranting ABX. Continue current regimen. Rx tussionex. Start 5-day burst of 40 mg prednisone. Return precautions discussed.   - chlorpheniramine-HYDROcodone (TUSSIONEX PENNKINETIC ER) 10-8 MG/5ML SUER; Take 5 mLs by mouth every 12 (twelve) hours as needed.  Dispense: 140 mL; Refill: 0 - predniSONE (DELTASONE) 20 MG tablet; Take 2 tablets (40 mg total) by mouth daily with breakfast.  Dispense: 10 tablet; Refill: 0 - ipratropium-albuterol (DUONEB) 0.5-2.5 (3) MG/3ML nebulizer solution 3 mL   Leeanne Rio, PA-C

## 2019-02-09 ENCOUNTER — Encounter: Payer: Self-pay | Admitting: Physician Assistant

## 2019-02-10 MED ORDER — MOMETASONE FUROATE 220 MCG/INH IN AEPB: 2.0000 | INHALATION_SPRAY | Freq: Every day | RESPIRATORY_TRACT | 0 refills | Status: DC

## 2019-02-10 NOTE — Addendum Note (Signed)
Addended by: Brunetta Jeans on: 02/10/2019 11:24 AM   Modules accepted: Orders

## 2019-02-11 MED ORDER — MOMETASONE FUROATE 220 MCG/INH IN AEPB: 2.0000 | INHALATION_SPRAY | Freq: Every day | RESPIRATORY_TRACT | 0 refills | Status: DC

## 2019-02-11 NOTE — Addendum Note (Signed)
Addended by: Brunetta Jeans on: 02/11/2019 05:25 PM   Modules accepted: Orders

## 2019-02-16 ENCOUNTER — Other Ambulatory Visit: Payer: Self-pay | Admitting: Physician Assistant

## 2019-02-16 MED ORDER — FLUCONAZOLE 150 MG PO TABS: 150.0000 mg | ORAL_TABLET | Freq: Once | ORAL | 0 refills | Status: AC

## 2019-03-04 ENCOUNTER — Other Ambulatory Visit: Payer: Self-pay | Admitting: Physician Assistant

## 2019-03-08 ENCOUNTER — Other Ambulatory Visit: Payer: Self-pay | Admitting: Physician Assistant

## 2019-03-20 ENCOUNTER — Encounter: Payer: Self-pay | Admitting: Physician Assistant

## 2019-03-22 ENCOUNTER — Encounter: Payer: Self-pay | Admitting: Physician Assistant

## 2019-05-02 ENCOUNTER — Encounter: Payer: Self-pay | Admitting: Physician Assistant

## 2019-05-02 ENCOUNTER — Other Ambulatory Visit: Payer: Self-pay | Admitting: Physician Assistant

## 2019-05-02 MED ORDER — ELETRIPTAN HYDROBROMIDE 20 MG PO TABS: 20.0000 mg | ORAL_TABLET | Freq: Once | ORAL | 0 refills | Status: DC

## 2019-05-24 ENCOUNTER — Ambulatory Visit (INDEPENDENT_AMBULATORY_CARE_PROVIDER_SITE_OTHER): Payer: 59 | Admitting: Physician Assistant

## 2019-05-24 ENCOUNTER — Encounter: Payer: Self-pay | Admitting: Physician Assistant

## 2019-05-24 ENCOUNTER — Other Ambulatory Visit: Payer: Self-pay

## 2019-05-24 VITALS — BP 123/87 | HR 90 | Ht <= 58 in | Wt 166.0 lb

## 2019-05-24 DIAGNOSIS — G43709 Chronic migraine without aura, not intractable, without status migrainosus: Secondary | ICD-10-CM | POA: Diagnosis not present

## 2019-05-24 MED ORDER — PANTOPRAZOLE SODIUM 40 MG PO TBEC: 40.0000 mg | DELAYED_RELEASE_TABLET | Freq: Every day | ORAL | 1 refills | Status: DC

## 2019-05-24 MED ORDER — ELETRIPTAN HYDROBROMIDE 20 MG PO TABS: 20.0000 mg | ORAL_TABLET | Freq: Once | ORAL | 0 refills | Status: DC

## 2019-05-24 NOTE — Progress Notes (Signed)
Virtual Visit via Video   I connected with patient on 05/24/19 at  2:30 PM EDT by a video enabled telemedicine application and verified that I am speaking with the correct person using two identifiers.  Location patient: Home Location provider: [facility], Office Persons participating in the virtual visit: Patient, Provider, [place] (Patina Moore)  I discussed the limitations of evaluation and management by telemedicine and the availability of in person appointments. The patient expressed understanding and agreed to proceed.  Subjective:   HPI:   Patient presents today via [facility] c/o increased frequency of her migraine headaches over the past month. Notes headaches once weekly, sometimes lasting 2 days or so. Notes characteristics of headaches are identical to prior bouts of migraine. Denies vision changes or AMS. Is questioning if allergies are contributing to flares. Notes nasal congestion with rhinorrhea and itchy eyes as well. Is taking Claritin over the counter with only minimal improvement. Current headache started last night. Has not taken her Relpax as she is out. Notes this medicine does get rid of her headaches.    ROS:   See pertinent positives and negatives per HPI.  Patient Active Problem List   Diagnosis Date Noted   Endometriosis 09/28/2017   Screen for STD (sexually transmitted disease) 08/14/2016   Intractable chronic migraine without aura and without status migrainosus 10/07/2015   Visit for preventive health examination 06/21/2015   Encounter for cervical Pap smear with pelvic exam 06/21/2015   Rhinitis, allergic 06/21/2015   Migraines 02/08/2014    Social History   Tobacco Use   Smoking status: Never Smoker   Smokeless tobacco: Never Used  Substance Use Topics   Alcohol use: Yes    Alcohol/week: 3.0 standard drinks    Types: 3 Glasses of wine per week    Current Outpatient Medications:    eletriptan (RELPAX) 20 MG tablet, Take 1  tablet (20 mg total) by mouth once for 1 dose. May repeat in 2 hours if headache persists or recurs., Disp: 10 tablet, Rfl: 0   fluticasone (FLONASE) 50 MCG/ACT nasal spray, Place 2 sprays into both nostrils daily., Disp: 16 g, Rfl: 0   letrozole (FEMARA) 2.5 MG tablet, Take 2.5 mg by mouth daily., Disp: , Rfl: 10   metoprolol tartrate (LOPRESSOR) 50 MG tablet, TAKE 1 TABLET BY MOUTH TWICE A DAY, Disp: 180 tablet, Rfl: 1   mometasone (ASMANEX) 220 MCG/INH inhaler, INHALE 2 PUFFS BY MOUTH INTO THE LUNGS DAILY, Disp: 1 Inhaler, Rfl: 3   Multiple Vitamins-Minerals (WOMENS MULTIVITAMIN PLUS) TABS, Take by mouth daily., Disp: , Rfl:    norethindrone (AYGESTIN) 5 MG tablet, Take 1 tablet by mouth daily. , Disp: , Rfl:    pantoprazole (PROTONIX) 40 MG tablet, TAKE 1 TABLET BY MOUTH EVERY DAY, Disp: 90 tablet, Rfl: 1   VENTOLIN HFA 108 (90 Base) MCG/ACT inhaler, INHALE 2 PUFFS INTO THE LUNGS EVERY 6 HOURS AS NEEDED FOR WHEEZE OR SHORTNESS OF BREATH, Disp: 18 Inhaler, Rfl: 0  Allergies  Allergen Reactions   Topamax [Topiramate]     Heart palpitations    Objective:   BP 123/87    Pulse 90    Ht 4\' 10"  (1.473 m)    Wt 166 lb (75.3 kg)    BMI 34.69 kg/m   Patient is well-developed, well-nourished in no acute distress.  Resting comfortably at home.  Head is normocephalic, atraumatic.  No labored breathing.  Speech is clear and coherent with logical contest.  Patient is alert and oriented  at baseline.  No facial drooping noted.  Assessment and Plan:   1. Chronic migraine without aura without status migrainosus, not intractable Likely triggered by increased allergy symptoms. Home regimen discussed for this. Recommend short course of Allegra-D, transitioning to plain Allegra. Flonase as directed. Relpax refilled. Continue BB.  If things are not calming back down will refer to Neurology for consideration of Ajovy or Emgality.    Leeanne Rio, PA-C 05/24/2019

## 2019-05-24 NOTE — Progress Notes (Signed)
I have discussed the procedure for the virtual visit with the patient who has given consent to proceed with assessment and treatment.   Kimberly Guerra S [place] Gear, CMA     

## 2019-06-17 ENCOUNTER — Encounter: Payer: Self-pay | Admitting: Physician Assistant

## 2019-07-13 ENCOUNTER — Encounter: Payer: Self-pay | Admitting: Physician Assistant

## 2019-07-18 DIAGNOSIS — Z0279 Encounter for issue of other medical certificate: Secondary | ICD-10-CM

## 2019-09-04 ENCOUNTER — Other Ambulatory Visit: Payer: Self-pay | Admitting: Physician Assistant

## 2019-09-05 ENCOUNTER — Ambulatory Visit: Payer: Self-pay | Admitting: Physician Assistant

## 2019-09-05 ENCOUNTER — Encounter: Payer: Self-pay | Admitting: Physician Assistant

## 2019-09-05 ENCOUNTER — Other Ambulatory Visit: Payer: Self-pay

## 2019-09-05 VITALS — BP 120/88 | HR 92 | Temp 98.7°F | Resp 16 | Ht <= 58 in | Wt 166.0 lb

## 2019-09-05 DIAGNOSIS — G43709 Chronic migraine without aura, not intractable, without status migrainosus: Secondary | ICD-10-CM

## 2019-09-05 DIAGNOSIS — G44209 Tension-type headache, unspecified, not intractable: Secondary | ICD-10-CM

## 2019-09-05 MED ORDER — ALBUTEROL SULFATE HFA 108 (90 BASE) MCG/ACT IN AERS: 2.0000 | INHALATION_SPRAY | Freq: Four times a day (QID) | RESPIRATORY_TRACT | 1 refills | Status: DC | PRN

## 2019-09-05 MED ORDER — TIZANIDINE HCL 4 MG PO TABS: 4.0000 mg | ORAL_TABLET | Freq: Three times a day (TID) | ORAL | 0 refills | Status: DC | PRN

## 2019-09-05 MED ORDER — KETOROLAC TROMETHAMINE 60 MG/2ML IM SOLN
60.0000 mg | Freq: Once | INTRAMUSCULAR | Status: AC
  Administered 2019-09-05: 60 mg via INTRAMUSCULAR

## 2019-09-05 MED ORDER — ELETRIPTAN HYDROBROMIDE 20 MG PO TABS: 20.0000 mg | ORAL_TABLET | Freq: Once | ORAL | 0 refills | Status: DC

## 2019-09-05 NOTE — Progress Notes (Signed)
Patient presents to clinic today c/o daily headaches over the past week.  Patient with history of migraine headache, averaging 1-2 headaches per month over the past few months.  Notes increase in frequency without increase in severity over the past week.  Notes waking up with headache each day.  Typically unilateral and associated with photophobia.  Thankfully has had no aura, nausea or vomiting.  Takes Tylenol or Excedrin sometimes with relief, but this past week has had to use her Relpax more often.  Notes this gets rid of headache but it returns the next morning.  Patient also noticing some congestion and tightness of neck and shoulders.  Denies fever, chills, malaise or fatigue.  Notes good sleep at night.  Does endorse increased stressors recently which she feels may be a trigger.  Is keeping well hydrated and trying to keep a well-balanced diet.  Denies recent change to diet.  Pain currently 4 out of 10.   Past Medical History:  Diagnosis Date  . Anemia   . Asthma    prn inhaler  . History of gestational hypertension   . Irregular menstrual cycle   . Menorrhagia   . Migraines   . Seasonal allergies   . Sickle cell trait ([place])     Current Outpatient Medications on File Prior to Visit  Medication Sig Dispense Refill  . albuterol (VENTOLIN HFA) 108 (90 Base) MCG/ACT inhaler Inhale 2 puffs into the lungs every 6 (six) hours as needed for wheezing or shortness of breath. 18 g 1  . eletriptan (RELPAX) 20 MG tablet Take 1 tablet (20 mg total) by mouth once for 1 dose. May repeat in 2 hours if headache persists or recurs. 12 tablet 0  . fluticasone (FLONASE) 50 MCG/ACT nasal spray Place 2 sprays into both nostrils daily. 16 g 0  . letrozole (FEMARA) 2.5 MG tablet Take 2.5 mg by mouth daily.  10  . metoprolol tartrate (LOPRESSOR) 50 MG tablet TAKE 1 TABLET BY MOUTH TWICE A DAY 180 tablet 1  . mometasone (ASMANEX) 220 MCG/INH inhaler INHALE 2 PUFFS BY MOUTH INTO THE LUNGS DAILY 1 Inhaler 3  .  Multiple Vitamins-Minerals (WOMENS MULTIVITAMIN PLUS) TABS Take by mouth daily.    . norethindrone (AYGESTIN) 5 MG tablet Take 1 tablet by mouth daily.     . pantoprazole (PROTONIX) 40 MG tablet Take 1 tablet (40 mg total) by mouth daily. 90 tablet 1   No current facility-administered medications on file prior to visit.     Allergies  Allergen Reactions  . Topamax [Topiramate]     Heart palpitations    Family History  Problem Relation Age of Onset  . Hypertension Mother   . Asthma Father   . Hypertension Father   . Asthma Brother   . Asthma Paternal Aunt   . Diabetes Paternal Aunt   . Cancer Maternal Grandfather        oral cancer  . Diabetes Maternal Grandfather   . Hypertension Maternal Grandfather     Social History   Socioeconomic History  . Marital status: Single    Spouse name: Not on file  . Number of children: 1  . Years of education: 53  . Highest education level: Not on file  Occupational History  . Occupation: Scientist, clinical (histocompatibility and immunogenetics): Engineer, water  Social Needs  . Financial resource strain: Not on file  . Food insecurity    Worry: Not on file    Inability: Not on file  . Transportation  needs    Medical: Not on file    Non-medical: Not on file  Tobacco Use  . Smoking status: Never Smoker  . Smokeless tobacco: Never Used  Substance and Sexual Activity  . Alcohol use: Yes    Alcohol/week: 3.0 standard drinks    Types: 3 Glasses of wine per week  . Drug use: No  . Sexual activity: Yes    Birth control/protection: None  Lifestyle  . Physical activity    Days per week: Not on file    Minutes per session: Not on file  . Stress: Not on file  Relationships  . Social Herbalist on phone: Not on file    Gets together: Not on file    Attends religious service: Not on file    Active member of club or organization: Not on file    Attends meetings of clubs or organizations: Not on file    Relationship status: Not on file  Other Topics Concern  . Not on  file  Social History Narrative   Lives with son (2010), employeed at [employer] part-time. Completed some college classes.    Review of Systems - See HPI.  All other ROS are negative.  Ht 4\' 10"  (1.473 m)   Wt 166 lb (75.3 kg)   BMI 34.69 kg/m   Physical Exam Vitals signs reviewed.  Constitutional:      Appearance: Normal appearance.  HENT:     Head: Normocephalic and atraumatic.     Nose: Nose normal.  Eyes:     Conjunctiva/sclera: Conjunctivae normal.  Neck:     Musculoskeletal: Neck supple. Muscular tenderness present. No neck rigidity, torticollis or spinous process tenderness.     Thyroid: No thyroid mass or thyroid tenderness.  Cardiovascular:     Rate and Rhythm: Normal rate and regular rhythm.     Pulses: Normal pulses.     Heart sounds: Normal heart sounds.  Pulmonary:     Effort: Pulmonary effort is normal.     Breath sounds: Normal breath sounds.  Lymphadenopathy:     Cervical: No cervical adenopathy.  Neurological:     General: No focal deficit present.     Mental Status: She is alert and oriented to person, place, and time.     Cranial Nerves: Cranial nerves are intact.     Motor: Motor function is intact.     Coordination: Coordination is intact.  Psychiatric:        Mood and Affect: Mood normal.    Assessment/Plan: 1. Chronic migraine without aura without status migrainosus, not intractable 2. Tension headache Patient with history of chronic migraine now with flareup over the past week.  Also seem to be components of tension headaches as well.  Toradol given in office for abortive therapy.  Relpax refilled.  She is encouraged to increase fluids and consider slight increase in caffeine.  For tension will Rx tizanidine.  Recommend heating pad to the neck for 10 to 15 minutes a few times per day.  Topical OTC medications reviewed.  Reviewed stress relief tactics and deep breathing exercises.  Close follow-up discussed.   Leeanne Rio, PA-C

## 2019-09-05 NOTE — Patient Instructions (Signed)
The Toradol given in office should help to abort the headache.  I feel you are having a combination of migraines and tension headaches. I want you to work on breathing exercises to help with stress. Also recommend heating pad to the neck for 10-15 minutes 2-3x day.  The Tizanidine is to take in the evening for the next 4-5 days to help reduce tension. Can be taken up to three times daily but no driving while on medication.  Continue Relpax as needed. Keep hydrated and limit salt. You can have some caffeine as this helps with headaches.  Let me know if things are not easing up as we will need to consider preventive medication.

## 2019-11-06 ENCOUNTER — Encounter (HOSPITAL_BASED_OUTPATIENT_CLINIC_OR_DEPARTMENT_OTHER): Payer: Self-pay | Admitting: *Deleted

## 2019-11-06 ENCOUNTER — Other Ambulatory Visit: Payer: Self-pay

## 2019-11-06 ENCOUNTER — Emergency Department (HOSPITAL_BASED_OUTPATIENT_CLINIC_OR_DEPARTMENT_OTHER)
Admission: EM | Admit: 2019-11-06 | Discharge: 2019-11-06 | Disposition: A | Discharge status: Home / Self Care | Payer: Self-pay | Attending: Emergency Medicine | Admitting: Emergency Medicine

## 2019-11-06 ENCOUNTER — Encounter: Payer: Self-pay | Admitting: Physician Assistant

## 2019-11-06 DIAGNOSIS — Z79899 Other long term (current) drug therapy: Secondary | ICD-10-CM | POA: Insufficient documentation

## 2019-11-06 DIAGNOSIS — J4521 Mild intermittent asthma with (acute) exacerbation: Secondary | ICD-10-CM | POA: Insufficient documentation

## 2019-11-06 MED ORDER — IPRATROPIUM BROMIDE HFA 17 MCG/ACT IN AERS
4.0000 | INHALATION_SPRAY | Freq: Once | RESPIRATORY_TRACT | Status: AC
  Administered 2019-11-06: 4 via RESPIRATORY_TRACT
  Filled 2019-11-06: qty 12.9

## 2019-11-06 MED ORDER — ALBUTEROL SULFATE HFA 108 (90 BASE) MCG/ACT IN AERS
6.0000 | INHALATION_SPRAY | Freq: Once | RESPIRATORY_TRACT | Status: AC
  Administered 2019-11-06: 16:00:00 6 via RESPIRATORY_TRACT

## 2019-11-06 MED ORDER — ALBUTEROL SULFATE (5 MG/ML) 0.5% IN NEBU: 2.5000 mg | INHALATION_SOLUTION | Freq: Four times a day (QID) | RESPIRATORY_TRACT | 0 refills | Status: DC | PRN

## 2019-11-06 MED ORDER — PREDNISONE 10 MG PO TABS: 40.0000 mg | ORAL_TABLET | Freq: Every day | ORAL | 0 refills | Status: AC

## 2019-11-06 MED ORDER — PREDNISONE 50 MG PO TABS
60.0000 mg | ORAL_TABLET | Freq: Once | ORAL | Status: AC
  Administered 2019-11-06: 60 mg via ORAL
  Filled 2019-11-06: qty 1

## 2019-11-06 MED ORDER — ALBUTEROL SULFATE HFA 108 (90 BASE) MCG/ACT IN AERS
INHALATION_SPRAY | RESPIRATORY_TRACT | Status: AC
  Administered 2019-11-06: 6 via RESPIRATORY_TRACT
  Filled 2019-11-06: qty 6.7

## 2019-11-06 NOTE — ED Provider Notes (Signed)
[facility] EMERGENCY DEPARTMENT Provider Note   CSN: [id number] Arrival date & time: 11/06/19  1504     History   Chief Complaint Chief Complaint  Patient presents with  . Asthma    HPI Kimberly Guerra is a 33 y.o. female.     Patient is a 33 year old female with past medical history of asthma presenting to the emergency department for asthma exacerbation.  Patient reports that this time of year, every year she gets an exacerbation of her asthma.  Reports over the last couple of weeks she has been increasing the use of her albuterol inhaler which is not helping.  Reports she also takes mometasone daily.  She denies any fever, chills, chest pain.  Reports shortness of breath.  Denies any Covid exposure.  Denies any other URI symptoms.  Reports that she tried to make appointment with her primary care doctor but they are not seeing people in person at this time because of Covid.     Past Medical History:  Diagnosis Date  . Anemia   . Asthma    prn inhaler  . History of gestational hypertension   . Irregular menstrual cycle   . Menorrhagia   . Migraines   . Seasonal allergies   . Sickle cell trait [facility])     Patient Active Problem List   Diagnosis Date Noted  . Endometriosis 09/28/2017  . Screen for STD (sexually transmitted disease) 08/14/2016  . Intractable chronic migraine without aura and without status migrainosus 10/07/2015  . Visit for preventive health examination 06/21/2015  . Encounter for cervical Pap smear with pelvic exam 06/21/2015  . Rhinitis, allergic 06/21/2015  . Migraines 02/08/2014    Past Surgical History:  Procedure Laterality Date  . CARPAL TUNNEL RELEASE Right 03/25/2015   Procedure:  ENDOSCOPIC CARPAL TUNNEL RELEASE;  Surgeon: Milly Jakob, MD;  Location: [facility];  Service: Orthopedics;  Laterality: Right;  . CESAREAN SECTION  01/02/2009  . DORSAL COMPARTMENT RELEASE Right 03/25/2015   Procedure: RIGHT WRIST  DEQUERVAIN RELEASE AND ;  Surgeon: Milly Jakob, MD;  Location: [facility];  Service: Orthopedics;  Laterality: Right;  . HYSTEROSCOPY W/D&C N/A 06/24/2017   Procedure: DILATATION AND CURETTAGE /HYSTEROSCOPY  pelvic biopsy and ablation of endometriosis;  Surgeon: Thurnell Lose, MD;  Location: [facility];  Service: Gynecology;  Laterality: N/A;  . LAPAROSCOPY N/A 06/24/2017   Procedure: LAPAROSCOPY DIAGNOSTIC;  Surgeon: Thurnell Lose, MD;  Location: [facility];  Service: Gynecology;  Laterality: N/A;  . WISDOM TOOTH EXTRACTION       OB History    Gravida  2   Para  1   Term  1   Preterm      AB  1   Living  1     SAB      TAB  1   Ectopic      Multiple      Live Births  1            Home Medications    Prior to Admission medications   Medication Sig Start Date End Date Taking? Authorizing Provider  albuterol (VENTOLIN HFA) 108 (90 Base) MCG/ACT inhaler Inhale 2 puffs into the lungs every 6 (six) hours as needed for wheezing or shortness of breath. 09/05/19   Brunetta Jeans, PA-C  eletriptan (RELPAX) 20 MG tablet Take 1 tablet (20 mg total) by mouth once for 1 dose. May repeat in 2 hours if headache persists  or recurs. 09/05/19 09/05/19  Brunetta Jeans, PA-C  fexofenadine-pseudoephedrine (ALLEGRA-D ALLERGY & CONGESTION) 180-240 MG 24 hr tablet Take 1 tablet by mouth daily.    [provider]  fluticasone (FLONASE) 50 MCG/ACT nasal spray Place 2 sprays into both nostrils daily. 01/09/19   Brunetta Jeans, PA-C  letrozole [facility]) 2.5 MG tablet Take 2.5 mg by mouth daily. 07/18/18   [provider]  metoprolol tartrate (LOPRESSOR) 50 MG tablet TAKE 1 TABLET BY MOUTH TWICE A DAY 03/06/19   Brunetta Jeans, PA-C  mometasone [facility]) 220 MCG/INH inhaler INHALE 2 PUFFS BY MOUTH INTO THE LUNGS DAILY 03/08/19   Brunetta Jeans, PA-C  Multiple Vitamins-Minerals (WOMENS MULTIVITAMIN PLUS) TABS Take by mouth  daily.    [provider]  norethindrone (AYGESTIN) 5 MG tablet Take 1 tablet by mouth daily.  09/23/17   [provider]  pantoprazole (PROTONIX) 40 MG tablet Take 1 tablet (40 mg total) by mouth daily. 05/24/19   Brunetta Jeans, PA-C  tiZANidine (ZANAFLEX) 4 MG tablet Take 1 tablet (4 mg total) by mouth every 8 (eight) hours as needed for muscle spasms. 09/05/19   Brunetta Jeans, PA-C    Family History Family History  Problem Relation Age of Onset  . Hypertension Mother   . Asthma Father   . Hypertension Father   . Asthma Brother   . Asthma Paternal Aunt   . Diabetes Paternal Aunt   . Cancer Maternal Grandfather        oral cancer  . Diabetes Maternal Grandfather   . Hypertension Maternal Grandfather     Social History Social History   Tobacco Use  . Smoking status: Never Smoker  . Smokeless tobacco: Never Used  Substance Use Topics  . Alcohol use: Yes    Alcohol/week: 3.0 standard drinks    Types: 3 Glasses of wine per week  . Drug use: No     Allergies   Topamax [topiramate]   Review of Systems Review of Systems  Constitutional: Negative for activity change, appetite change, chills and fever.  HENT: Negative for congestion, rhinorrhea, sinus pressure, sinus pain, sore throat and trouble swallowing.   Eyes: Negative for redness.  Respiratory: Positive for cough, shortness of breath and wheezing. Negative for stridor.   Cardiovascular: Negative for chest pain, palpitations and leg swelling.  Gastrointestinal: Negative for abdominal pain, nausea and vomiting.  Genitourinary: Negative for dysuria.  Musculoskeletal: Negative for arthralgias, back pain and myalgias.  Skin: Negative for rash.  Neurological: Negative for dizziness, light-headedness and headaches.     Physical Exam Updated Vital Signs BP (!) 147/106 (BP Location: Right Arm)   Pulse (!) 112   Temp 98.6 F (37 C)   Resp 20   Ht 4\' 11"  (1.499 m)   Wt 74.8 kg   LMP 09/06/2019  (Approximate)   SpO2 100%   BMI 33.33 kg/m   Physical Exam Vitals signs and nursing note reviewed.  Constitutional:      General: She is not in acute distress.    Appearance: Normal appearance. She is obese. She is not ill-appearing, toxic-appearing or diaphoretic.  HENT:     Head: Normocephalic.     Mouth/Throat:     Mouth: Mucous membranes are moist.  Eyes:     Conjunctiva/sclera: Conjunctivae normal.  Cardiovascular:     Rate and Rhythm: Regular rhythm. Tachycardia present.     Pulses: Normal pulses.     Heart sounds: Normal heart sounds.  Pulmonary:  Effort: Pulmonary effort is normal. Tachypnea and prolonged expiration present. No accessory muscle usage.     Breath sounds: Decreased air movement present. Examination of the right-upper field reveals wheezing. Examination of the left-upper field reveals wheezing. Examination of the right-middle field reveals wheezing. Examination of the left-middle field reveals wheezing. Examination of the right-lower field reveals wheezing. Examination of the left-lower field reveals wheezing. Wheezing present.  Skin:    General: Skin is dry.  Neurological:     Mental Status: She is alert.  Psychiatric:        Mood and Affect: Mood normal.      ED Treatments / Results  Labs (all labs ordered are listed, but only abnormal results are displayed) Labs Reviewed - No data to display  EKG None  Radiology No results found.  Procedures Procedures (including critical care time)  Medications Ordered in ED Medications  albuterol (VENTOLIN HFA) 108 (90 Base) MCG/ACT inhaler 6 puff (6 puffs Inhalation Given 11/06/19 1533)  predniSONE (DELTASONE) tablet 60 mg (60 mg Oral Given 11/06/19 1630)  ipratropium (ATROVENT HFA) inhaler 4 puff (4 puffs Inhalation Given 11/06/19 1626)     Initial Impression / Assessment and Plan / ED Course  I have reviewed the triage vital signs and the nursing notes.  Pertinent labs & imaging results that were  available during my care of the patient were reviewed by me and considered in my medical decision making (see chart for details).  Clinical Course as of Nov 05 1706  [facility] Nov 06, 2019  1705 Patient here for acute asthma exacerbation over the last couple of days.  No fever or other URI symptoms.  Patient with diffuse wheezing and decreased breath sounds on my exam.  She was given 6 puffs of albuterol and 4 of ipratropium.  Unable to do nebulizers due to contacts of Covid pandemic.  Patient reports that she is feeling better.  I have given the patient a prescription for a nebulizer machine with the albuterol solution.  Patient will take home her albuterol and ipratropium inhalers and continue her regular meds at home.  She was advised on strict return precautions.  She is 100% on room air and afebrile.   [KM]    Clinical Course User Index [KM] Alveria Apley, PA-C       Based on review of vitals, medical screening exam, lab work and/or imaging, there does not appear to be an acute, emergent etiology for the patient's symptoms. Counseled pt on good return precautions and encouraged both PCP and ED follow-up as needed.  Prior to discharge, I also discussed incidental imaging findings with patient in detail and advised appropriate, recommended follow-up in detail.  Clinical Impression: 1. Mild intermittent asthma with exacerbation     Disposition: Discharge  Prior to providing a prescription for a controlled substance, I independently reviewed the patient's recent prescription history on the [place]. The patient had no recent or regular prescriptions and was deemed appropriate for a brief, less than 3 day prescription of narcotic for acute analgesia.  This note was prepared with assistance of Systems analyst. Occasional wrong-word or sound-a-like substitutions may have occurred due to the inherent limitations of voice recognition  software.   Final Clinical Impressions(s) / ED Diagnoses   Final diagnoses:  None    ED Discharge Orders    None       Kristine Royal 11/06/19 1707    Dorie Rank, MD 11/06/19 2329

## 2019-11-06 NOTE — ED Triage Notes (Signed)
Short of breath x 3 days  Using inhaler w short time of relief

## 2019-11-06 NOTE — Discharge Instructions (Signed)
Thank you for allowing me to care for you today. Please return to the emergency department if you have new or worsening symptoms. Take your medications as instructed.  ° °

## 2020-06-20 ENCOUNTER — Ambulatory Visit (INDEPENDENT_AMBULATORY_CARE_PROVIDER_SITE_OTHER): Payer: BC Managed Care – PPO | Admitting: Physician Assistant

## 2020-06-20 ENCOUNTER — Other Ambulatory Visit: Payer: Self-pay | Admitting: Physician Assistant

## 2020-06-20 ENCOUNTER — Other Ambulatory Visit: Payer: Self-pay

## 2020-06-20 ENCOUNTER — Encounter: Payer: Self-pay | Admitting: Physician Assistant

## 2020-06-20 VITALS — BP 116/80 | HR 89 | Temp 98.6°F | Resp 18 | Ht 59.0 in | Wt 170.0 lb

## 2020-06-20 DIAGNOSIS — G43709 Chronic migraine without aura, not intractable, without status migrainosus: Secondary | ICD-10-CM

## 2020-06-20 DIAGNOSIS — Z0001 Encounter for general adult medical examination with abnormal findings: Secondary | ICD-10-CM | POA: Diagnosis not present

## 2020-06-20 DIAGNOSIS — G4762 Sleep related leg cramps: Secondary | ICD-10-CM | POA: Diagnosis not present

## 2020-06-20 DIAGNOSIS — Z23 Encounter for immunization: Secondary | ICD-10-CM

## 2020-06-20 DIAGNOSIS — R350 Frequency of micturition: Secondary | ICD-10-CM

## 2020-06-20 DIAGNOSIS — Z0000 Encounter for general adult medical examination without abnormal findings: Secondary | ICD-10-CM

## 2020-06-20 LAB — COMPREHENSIVE METABOLIC PANEL
ALT: 12 U/L (ref 0–35)
AST: 15 U/L (ref 0–37)
Albumin: 4.6 g/dL (ref 3.5–5.2)
Alkaline Phosphatase: 102 U/L (ref 39–117)
BUN: 8 mg/dL (ref 6–23)
CO2: 25 mEq/L (ref 19–32)
Calcium: 9.8 mg/dL (ref 8.4–10.5)
Chloride: 102 mEq/L (ref 96–112)
Creatinine, Ser: 0.6 mg/dL (ref 0.40–1.20)
GFR: 138.97 mL/min (ref >60.00)
Glucose, Bld: 86 mg/dL (ref 70–99)
Potassium: 4.3 mEq/L (ref 3.5–5.1)
Sodium: 135 mEq/L (ref 135–145)
Total Bilirubin: 0.5 mg/dL (ref 0.2–1.2)
Total Protein: 7.9 g/dL (ref 6.0–8.3)

## 2020-06-20 LAB — LIPID PANEL
Cholesterol: 152 mg/dL (ref 0–200)
HDL: 55.2 mg/dL (ref >39.00)
LDL Cholesterol: 88 mg/dL (ref 0–99)
NonHDL: 96.95
Total CHOL/HDL Ratio: 3
Triglycerides: 46 mg/dL (ref 0.0–149.0)
VLDL: 9.2 mg/dL (ref 0.0–40.0)

## 2020-06-20 LAB — CBC WITH DIFFERENTIAL/PLATELET
Basophils Absolute: 0.1 10*3/uL (ref 0.0–0.1)
Basophils Relative: 1.1 % (ref 0.0–3.0)
Eosinophils Absolute: 0.3 10*3/uL (ref 0.0–0.7)
Eosinophils Relative: 2.7 % (ref 0.0–5.0)
HCT: 37.2 % (ref 36.0–46.0)
Hemoglobin: 12 g/dL (ref 12.0–15.0)
Lymphocytes Relative: 19.6 % (ref 12.0–46.0)
Lymphs Abs: 1.9 10*3/uL (ref 0.7–4.0)
MCHC: 32.2 g/dL (ref 30.0–36.0)
MCV: 74 fl — ABNORMAL LOW (ref 78.0–100.0)
Monocytes Absolute: 0.5 10*3/uL (ref 0.1–1.0)
Monocytes Relative: 5 % (ref 3.0–12.0)
Neutro Abs: 6.8 10*3/uL (ref 1.4–7.7)
Neutrophils Relative %: 71.6 % (ref 43.0–77.0)
Platelets: 467 10*3/uL — ABNORMAL HIGH (ref 150.0–400.0)
RBC: 5.03 Mil/uL (ref 3.87–5.11)
RDW: 17.4 % — ABNORMAL HIGH (ref 11.5–15.5)
WBC: 9.5 10*3/uL (ref 4.0–10.5)

## 2020-06-20 LAB — HEMOGLOBIN A1C: Hgb A1c MFr Bld: 5.7 % (ref 4.6–6.5)

## 2020-06-20 LAB — IRON: Iron: 45 ug/dL (ref 42–145)

## 2020-06-20 LAB — TSH: TSH: 0.8 u[IU]/mL (ref 0.35–4.50)

## 2020-06-20 LAB — MAGNESIUM: Magnesium: 1.8 mg/dL (ref 1.5–2.5)

## 2020-06-20 MED ORDER — PANTOPRAZOLE SODIUM 40 MG PO TBEC: 40.0000 mg | DELAYED_RELEASE_TABLET | Freq: Every day | ORAL | 1 refills | Status: DC

## 2020-06-20 MED ORDER — METOPROLOL TARTRATE 50 MG PO TABS: 50.0000 mg | ORAL_TABLET | Freq: Two times a day (BID) | ORAL | 1 refills | Status: DC

## 2020-06-20 MED ORDER — ELETRIPTAN HYDROBROMIDE 20 MG PO TABS: 20.0000 mg | ORAL_TABLET | Freq: Once | ORAL | 0 refills | Status: DC

## 2020-06-20 NOTE — Progress Notes (Signed)
Patient presents to clinic today for annual exam.  Patient is fasting for labs.  Acute Concerns: Patient endorses occasional cramping of legs at night, also with a crawling and aching sensation. Notes she keeps well-hydrated and keeps a well-balanced diet. Denies daytime symptoms.   Patient would like assessment for diabetes due to significant family history. Denies polyuria, polydipsia or polyphagia. Notes some urinary frequency intermittently without urgency, dysuria or hematuria.   Health Maintenance: Immunizations -- Due for TDaP. Agrees to have today. PAP -- Notes 10/20920 with Dr. Simona Huh. Normal per patient. Records requested.   Past Medical History:  Diagnosis Date  . Anemia   . Asthma    prn inhaler  . History of gestational hypertension   . Irregular menstrual cycle   . Menorrhagia   . Migraines   . Seasonal allergies   . Sickle cell trait [facility])     Past Surgical History:  Procedure Laterality Date  . CARPAL TUNNEL RELEASE Right 03/25/2015   Procedure:  ENDOSCOPIC CARPAL TUNNEL RELEASE;  Surgeon: Milly Jakob, MD;  Location: [place];  Service: Orthopedics;  Laterality: Right;  . CESAREAN SECTION  01/02/2009  . DORSAL COMPARTMENT RELEASE Right 03/25/2015   Procedure: RIGHT WRIST DEQUERVAIN RELEASE AND ;  Surgeon: Milly Jakob, MD;  Location: [place];  Service: Orthopedics;  Laterality: Right;  . HYSTEROSCOPY WITH D & C N/A 06/24/2017   Procedure: DILATATION AND CURETTAGE /HYSTEROSCOPY  pelvic biopsy and ablation of endometriosis;  Surgeon: Thurnell Lose, MD;  Location: [facility];  Service: Gynecology;  Laterality: N/A;  . LAPAROSCOPY N/A 06/24/2017   Procedure: LAPAROSCOPY DIAGNOSTIC;  Surgeon: Thurnell Lose, MD;  Location: [facility];  Service: Gynecology;  Laterality: N/A;  . WISDOM TOOTH EXTRACTION      Current Outpatient Medications on File Prior to Visit  Medication Sig Dispense Refill    . albuterol (VENTOLIN HFA) 108 (90 Base) MCG/ACT inhaler Inhale 2 puffs into the lungs every 6 (six) hours as needed for wheezing or shortness of breath. 18 g 1  . fexofenadine-pseudoephedrine (ALLEGRA-D ALLERGY & CONGESTION) 180-240 MG 24 hr tablet Take 1 tablet by mouth daily.    . fluticasone (FLONASE) 50 MCG/ACT nasal spray Place 2 sprays into both nostrils daily. 16 g 0  . letrozole (FEMARA) 2.5 MG tablet Take 2.5 mg by mouth daily.  10  . metoprolol tartrate (LOPRESSOR) 50 MG tablet TAKE 1 TABLET BY MOUTH TWICE A DAY 180 tablet 1  . mometasone (ASMANEX) 220 MCG/INH inhaler INHALE 2 PUFFS BY MOUTH INTO THE LUNGS DAILY 1 Inhaler 3  . Multiple Vitamins-Minerals (WOMENS MULTIVITAMIN PLUS) TABS Take by mouth daily.    . norethindrone (AYGESTIN) 5 MG tablet Take 1 tablet by mouth daily.     . pantoprazole (PROTONIX) 40 MG tablet Take 1 tablet (40 mg total) by mouth daily. 90 tablet 1  . albuterol (PROVENTIL) (5 MG/ML) 0.5% nebulizer solution Take 0.5 mLs (2.5 mg total) by nebulization every 6 (six) hours as needed for wheezing or shortness of breath. 60 mL 0  . eletriptan (RELPAX) 20 MG tablet Take 1 tablet (20 mg total) by mouth once for 1 dose. May repeat in 2 hours if headache persists or recurs. 12 tablet 0  . tiZANidine (ZANAFLEX) 4 MG tablet Take 1 tablet (4 mg total) by mouth every 8 (eight) hours as needed for muscle spasms. (Patient not taking: Reported on 06/20/2020) 30 tablet 0   No current facility-administered medications on  file prior to visit.    Allergies  Allergen Reactions  . Topamax [Topiramate]     Heart palpitations    Family History  Problem Relation Age of Onset  . Hypertension Mother   . Asthma Father   . Hypertension Father   . Asthma Brother   . Asthma Paternal Aunt   . Diabetes Paternal Aunt   . Cancer Maternal Grandfather        oral cancer  . Diabetes Maternal Grandfather   . Hypertension Maternal Grandfather     Social History   Socioeconomic  History  . Marital status: Single    Spouse name: Not on file  . Number of children: 1  . Years of education: 75  . Highest education level: Not on file  Occupational History  . Occupation: Scientist, clinical (histocompatibility and immunogenetics): [employer]  Tobacco Use  . Smoking status: Never Smoker  . Smokeless tobacco: Never Used  Vaping Use  . Vaping Use: Never used  Substance and Sexual Activity  . Alcohol use: Yes    Alcohol/week: 3.0 standard drinks    Types: 3 Glasses of wine per week  . Drug use: No  . Sexual activity: Yes    Birth control/protection: None  Other Topics Concern  . Not on file  Social History Narrative   Lives with son (2010), employeed at [employer] part-time. Completed some college classes.    Social Determinants of Health   Financial Resource Strain:   . Difficulty of Paying Living Expenses:   Food Insecurity:   . Worried About Charity fundraiser in the Last Year:   . Arboriculturist in the Last Year:   Transportation Needs:   . Film/video editor (Medical):   Marland Kitchen Lack of Transportation (Non-Medical):   Physical Activity:   . Days of Exercise per Week:   . Minutes of Exercise per Session:   Stress:   . Feeling of Stress :   Social Connections:   . Frequency of Communication with Friends and Family:   . Frequency of Social Gatherings with Friends and Family:   . Attends Religious Services:   . Active Member of Clubs or Organizations:   . Attends Archivist Meetings:   Marland Kitchen Marital Status:   Intimate Partner Violence:   . Fear of Current or Ex-Partner:   . Emotionally Abused:   Marland Kitchen Physically Abused:   . Sexually Abused:    Review of Systems  Constitutional: Negative for fever and weight loss.  HENT: Negative for ear discharge, ear pain, hearing loss and tinnitus.   Eyes: Negative for blurred vision, double vision, photophobia and pain.  Respiratory: Negative for cough and shortness of breath.   Cardiovascular: Negative for chest pain and palpitations.    Gastrointestinal: Negative for abdominal pain, blood in stool, constipation, diarrhea, heartburn, melena, nausea and vomiting.  Genitourinary: Negative for dysuria, flank pain, frequency, hematuria and urgency.  Musculoskeletal: Negative for falls.  Neurological: Negative for dizziness, loss of consciousness and headaches.  Endo/Heme/Allergies: Negative for environmental allergies.  Psychiatric/Behavioral: Negative for depression, hallucinations, substance abuse and suicidal ideas. The patient is not nervous/anxious and does not have insomnia.    BP 116/80   Pulse 89   Temp 98.6 F (37 C) (Temporal)   Resp 18   Ht 4\' 11"  (1.499 m)   Wt 170 lb (77.1 kg)   SpO2 97%   BMI 34.34 kg/m   Physical Exam Vitals reviewed.  HENT:     Head:  Normocephalic and atraumatic.     Right Ear: Tympanic membrane, ear canal and external ear normal.     Left Ear: Tympanic membrane, ear canal and external ear normal.     Nose: Nose normal. No mucosal edema.     Mouth/Throat:     Pharynx: Uvula midline. No oropharyngeal exudate or posterior oropharyngeal erythema.  Eyes:     Conjunctiva/sclera: Conjunctivae normal.     Pupils: Pupils are equal, round, and reactive to light.  Neck:     Thyroid: No thyromegaly.  Cardiovascular:     Rate and Rhythm: Normal rate and regular rhythm.     Heart sounds: Normal heart sounds.  Pulmonary:     Effort: Pulmonary effort is normal. No respiratory distress.     Breath sounds: Normal breath sounds. No wheezing or rales.  Abdominal:     General: Bowel sounds are normal. There is no distension.     Palpations: Abdomen is soft. There is no mass.     Tenderness: There is no abdominal tenderness. There is no guarding or rebound.  Musculoskeletal:     Cervical back: Neck supple.  Lymphadenopathy:     Cervical: No cervical adenopathy.  Skin:    General: Skin is warm and dry.     Findings: No rash.  Neurological:     Mental Status: She is alert and oriented to  person, place, and time.     Cranial Nerves: No cranial nerve deficit.    Assessment/Plan: 1. Visit for preventive health examination Depression screen negative. Health Maintenance reviewed -- Tetanus updated today. PAP UTD Preventive schedule discussed and handout given in AVS. Will obtain fasting labs today.  - CBC with Differential/Platelet - Comprehensive metabolic panel - Lipid panel - Hemoglobin A1c - TSH  2. Chronic migraine without aura without status migrainosus, not intractable Stable. Continue current regimen.  3. Nocturnal leg cramps Seems most likely RLS. Will check CMP, magnesium and iron levels today. If negative will start trial of Requip. Stretching exercises reviewed with patient.  - Comprehensive metabolic panel - Magnesium - Iron  4. Urinary frequency Without other urinary symptoms. Will check urine culture. Checking glucose and A1C today as well. Avoid caffeine. Will treat if labs/urine testing with abnormal findings.  - Urine Culture  This visit occurred during the SARS-CoV-2 public health emergency.  Safety protocols were in place, including screening questions prior to the visit, additional usage of staff PPE, and extensive cleaning of exam room while observing appropriate contact time as indicated for disinfecting solutions.     Leeanne Rio, PA-C

## 2020-06-20 NOTE — Patient Instructions (Signed)
Please go to the lab for blood work.   Our office will call you with your results unless you have chosen to receive results via MyChart.  If your blood work is normal we will follow-up each year for physicals and as scheduled for chronic medical problems.  If anything is abnormal we will treat accordingly and get you in for a follow-up.  Continue chronic medications as directed    Preventive Care 70-33 Years Old, Female Preventive care refers to visits with your health care provider and lifestyle choices that can promote health and wellness. This includes:  A yearly physical exam. This may also be called an annual well check.  Regular dental visits and eye exams.  Immunizations.  Screening for certain conditions.  Healthy lifestyle choices, such as eating a healthy diet, getting regular exercise, not using drugs or products that contain nicotine and tobacco, and limiting alcohol use. What can I expect for my preventive care visit? Physical exam Your health care provider will check your:  Height and weight. This may be used to calculate body mass index (BMI), which tells if you are at a healthy weight.  Heart rate and blood pressure.  Skin for abnormal spots. Counseling Your health care provider may ask you questions about your:  Alcohol, tobacco, and drug use.  Emotional well-being.  Home and relationship well-being.  Sexual activity.  Eating habits.  Work and work Statistician.  Method of birth control.  Menstrual cycle.  Pregnancy history. What immunizations do I need?  Influenza (flu) vaccine  This is recommended every year. Tetanus, diphtheria, and pertussis (Tdap) vaccine  You may need a Td booster every 10 years. Varicella (chickenpox) vaccine  You may need this if you have not been vaccinated. Human papillomavirus (HPV) vaccine  If recommended by your health care provider, you may need three doses over 6 months. Measles, mumps, and rubella  (MMR) vaccine  You may need at least one dose of MMR. You may also need a second dose. Meningococcal conjugate (MenACWY) vaccine  One dose is recommended if you are age 30-21 years and a first-year college student living in a residence hall, or if you have one of several medical conditions. You may also need additional booster doses. Pneumococcal conjugate (PCV13) vaccine  You may need this if you have certain conditions and were not previously vaccinated. Pneumococcal polysaccharide (PPSV23) vaccine  You may need one or two doses if you smoke cigarettes or if you have certain conditions. Hepatitis A vaccine  You may need this if you have certain conditions or if you travel or work in places where you may be exposed to hepatitis A. Hepatitis B vaccine  You may need this if you have certain conditions or if you travel or work in places where you may be exposed to hepatitis B. Haemophilus influenzae type b (Hib) vaccine  You may need this if you have certain conditions. You may receive vaccines as individual doses or as more than one vaccine together in one shot (combination vaccines). Talk with your health care provider about the risks and benefits of combination vaccines. What tests do I need?  Blood tests  Lipid and cholesterol levels. These may be checked every 5 years starting at age 15.  Hepatitis C test.  Hepatitis B test. Screening  Diabetes screening. This is done by checking your blood sugar (glucose) after you have not eaten for a while (fasting).  Sexually transmitted disease (STD) testing.  BRCA-related cancer screening. This may be done  if you have a family history of breast, ovarian, tubal, or peritoneal cancers.  Pelvic exam and Pap test. This may be done every 3 years starting at age 23. Starting at age 48, this may be done every 5 years if you have a Pap test in combination with an HPV test. Talk with your health care provider about your test results, treatment  options, and if necessary, the need for more tests. Follow these instructions at home: Eating and drinking   Eat a diet that includes fresh fruits and vegetables, whole grains, lean protein, and low-fat dairy.  Take vitamin and mineral supplements as recommended by your health care provider.  Do not drink alcohol if: ? Your health care provider tells you not to drink. ? You are pregnant, may be pregnant, or are planning to become pregnant.  If you drink alcohol: ? Limit how much you have to 0-1 drink a day. ? Be aware of how much alcohol is in your drink. In the [place] one drink equals one 12 oz bottle of beer (355 mL), one 5 oz glass of wine (148 mL), or one 1 oz glass of hard liquor (44 mL). Lifestyle  Take daily care of your teeth and gums.  Stay active. Exercise for at least 30 minutes on 5 or more days each week.  Do not use any products that contain nicotine or tobacco, such as cigarettes, e-cigarettes, and chewing tobacco. If you need help quitting, ask your health care provider.  If you are sexually active, practice safe sex. Use a condom or other form of birth control (contraception) in order to prevent pregnancy and STIs (sexually transmitted infections). If you plan to become pregnant, see your health care provider for a preconception visit. What's next?  Visit your health care provider once a year for a well check visit.  Ask your health care provider how often you should have your eyes and teeth checked.  Stay up to date on all vaccines. This information is not intended to replace advice given to you by your health care provider. Make sure you discuss any questions you have with your health care provider. Document Revised: 08/25/2018 Document Reviewed: 08/25/2018 Elsevier Patient Education  2020 [employer].

## 2020-06-22 LAB — URINE CULTURE
MICRO NUMBER:: 10632919
SPECIMEN QUALITY:: ADEQUATE

## 2020-06-25 ENCOUNTER — Other Ambulatory Visit: Payer: Self-pay

## 2020-06-25 MED ORDER — CEPHALEXIN 500 MG PO CAPS
500.0000 mg | ORAL_CAPSULE | Freq: Two times a day (BID) | ORAL | 0 refills | Status: DC
Start: 2020-06-25 — End: 2020-07-09

## 2020-07-08 ENCOUNTER — Encounter: Payer: Self-pay | Admitting: Physician Assistant

## 2020-07-08 NOTE — Telephone Encounter (Signed)
Patient needs appointment.

## 2020-07-09 ENCOUNTER — Encounter: Payer: Self-pay | Admitting: Physician Assistant

## 2020-07-09 ENCOUNTER — Other Ambulatory Visit: Payer: Self-pay

## 2020-07-09 ENCOUNTER — Ambulatory Visit: Payer: BC Managed Care – PPO | Admitting: Physician Assistant

## 2020-07-09 VITALS — BP 118/90 | HR 95 | Temp 99.4°F | Resp 16 | Ht 59.0 in | Wt 169.0 lb

## 2020-07-09 DIAGNOSIS — G43709 Chronic migraine without aura, not intractable, without status migrainosus: Secondary | ICD-10-CM | POA: Diagnosis not present

## 2020-07-09 MED ORDER — KETOROLAC TROMETHAMINE 60 MG/2ML IM SOLN
60.0000 mg | Freq: Once | INTRAMUSCULAR | Status: AC
Start: 2020-07-09 — End: 2020-07-09
  Administered 2020-07-09: 60 mg via INTRAMUSCULAR

## 2020-07-09 NOTE — Progress Notes (Signed)
Patient presents to clinic today c/o exacerbation of chronic migraines over the past 5 days.  Patient endorses noting daily headache starting last Wednesday, described as left-sided and mainly in the temporal region although sometimes there is some discomfort in the occipital region.  Associated with photophobia, phonophobia and nausea.  Denies altered mental status or vision changes.  Denies emesis.  Has taken her Relpax which will help abort the headache for the most part but she notes developing another headache within 24 hours.  Has been trying to supplement her Relpax with OTC Excedrin Tylenol but with poor results.  Patient does endorse taking her beta-blocker as directed for prevention.  Past Medical History:  Diagnosis Date  . Anemia   . Asthma    prn inhaler  . History of gestational hypertension   . Irregular menstrual cycle   . Menorrhagia   . Migraines   . Seasonal allergies   . Sickle cell trait ([place])     Current Outpatient Medications on File Prior to Visit  Medication Sig Dispense Refill  . albuterol (PROVENTIL) (5 MG/ML) 0.5% nebulizer solution Take 0.5 mLs (2.5 mg total) by nebulization every 6 (six) hours as needed for wheezing or shortness of breath. 60 mL 0  . albuterol (VENTOLIN HFA) 108 (90 Base) MCG/ACT inhaler Inhale 2 puffs into the lungs every 6 (six) hours as needed for wheezing or shortness of breath. 18 g 1  . eletriptan (RELPAX) 20 MG tablet Take 1 tablet (20 mg total) by mouth once for 1 dose. May repeat in 2 hours if headache persists or recurs. 12 tablet 0  . fexofenadine-pseudoephedrine (ALLEGRA-D ALLERGY & CONGESTION) 180-240 MG 24 hr tablet Take 1 tablet by mouth daily.    . fluticasone (FLONASE) 50 MCG/ACT nasal spray Place 2 sprays into both nostrils daily. 16 g 0  . letrozole (FEMARA) 2.5 MG tablet Take 2.5 mg by mouth daily.  10  . metoprolol tartrate (LOPRESSOR) 50 MG tablet Take 1 tablet (50 mg total) by mouth 2 (two) times daily. 180 tablet 1  .  mometasone (ASMANEX) 220 MCG/INH inhaler INHALE 2 PUFFS BY MOUTH INTO THE LUNGS DAILY 1 Inhaler 3  . Multiple Vitamins-Minerals (WOMENS MULTIVITAMIN PLUS) TABS Take by mouth daily.    . norethindrone (AYGESTIN) 5 MG tablet Take 1 tablet by mouth daily.     . pantoprazole (PROTONIX) 40 MG tablet Take 1 tablet (40 mg total) by mouth daily. 90 tablet 1   No current facility-administered medications on file prior to visit.    Allergies  Allergen Reactions  . Topamax [Topiramate]     Heart palpitations    Family History  Problem Relation Age of Onset  . Hypertension Mother   . Asthma Father   . Hypertension Father   . Asthma Brother   . Asthma Paternal Aunt   . Diabetes Paternal Aunt   . Cancer Maternal Grandfather        oral cancer  . Diabetes Maternal Grandfather   . Hypertension Maternal Grandfather     Social History   Socioeconomic History  . Marital status: Single    Spouse name: Not on file  . Number of children: 1  . Years of education: 30  . Highest education level: Not on file  Occupational History  . Occupation: Scientist, clinical (histocompatibility and immunogenetics): [employer]  Tobacco Use  . Smoking status: Never Smoker  . Smokeless tobacco: Never Used  Vaping Use  . Vaping Use: Never used  Substance and Sexual  Activity  . Alcohol use: Yes    Alcohol/week: 3.0 standard drinks    Types: 3 Glasses of wine per week  . Drug use: No  . Sexual activity: Yes    Birth control/protection: None  Other Topics Concern  . Not on file  Social History Narrative   Lives with son (2010), employeed at [employer] part-time. Completed some college classes.    Social Determinants of Health   Financial Resource Strain:   . Difficulty of Paying Living Expenses:   Food Insecurity:   . Worried About Charity fundraiser in the Last Year:   . Arboriculturist in the Last Year:   Transportation Needs:   . Film/video editor (Medical):   Marland Kitchen Lack of Transportation (Non-Medical):   Physical Activity:   . Days of  Exercise per Week:   . Minutes of Exercise per Session:   Stress:   . Feeling of Stress :   Social Connections:   . Frequency of Communication with Friends and Family:   . Frequency of Social Gatherings with Friends and Family:   . Attends Religious Services:   . Active Member of Clubs or Organizations:   . Attends Archivist Meetings:   Marland Kitchen Marital Status:    Review of Systems - See HPI.  All other ROS are negative.  BP 118/90   Pulse 95   Temp 99.4 F (37.4 C) (Temporal)   Resp 16   Ht 4\' 11"  (1.499 m)   Wt 169 lb (76.7 kg)   SpO2 98%   BMI 34.13 kg/m   Physical Exam Vitals reviewed.  HENT:     Head: Normocephalic and atraumatic.  Eyes:     Conjunctiva/sclera: Conjunctivae normal.     Pupils: Pupils are equal, round, and reactive to light.  Cardiovascular:     Rate and Rhythm: Normal rate and regular rhythm.     Pulses: Normal pulses.     Heart sounds: Normal heart sounds.  Pulmonary:     Effort: Pulmonary effort is normal.     Breath sounds: Normal breath sounds.  Musculoskeletal:     Cervical back: Neck supple.  Neurological:     General: No focal deficit present.     Mental Status: She is alert and oriented to person, place, and time.  Psychiatric:        Mood and Affect: Mood normal.     Recent Results (from the past 2160 hour(s))  CBC with Differential/Platelet     Status: Abnormal   Collection Time: 06/20/20 10:37 AM  Result Value Ref Range   WBC 9.5 4.0 - 10.5 K/uL   RBC 5.03 3.87 - 5.11 Mil/uL   Hemoglobin 12.0 12.0 - 15.0 g/dL   HCT 37.2 36 - 46 %   MCV 74.0 (L) 78.0 - 100.0 fl   MCHC 32.2 30.0 - 36.0 g/dL   RDW 17.4 (H) 11.5 - 15.5 %   Platelets 467.0 (H) 150 - 400 K/uL   Neutrophils Relative % 71.6 43 - 77 %   Lymphocytes Relative 19.6 12 - 46 %   Monocytes Relative 5.0 3 - 12 %   Eosinophils Relative 2.7 0 - 5 %   Basophils Relative 1.1 0 - 3 %   Neutro Abs 6.8 1.4 - 7.7 K/uL   Lymphs Abs 1.9 0.7 - 4.0 K/uL   Monocytes Absolute  0.5 0 - 1 K/uL   Eosinophils Absolute 0.3 0 - 0 K/uL   Basophils Absolute 0.1  0 - 0 K/uL  Comprehensive metabolic panel     Status: None   Collection Time: 06/20/20 10:37 AM  Result Value Ref Range   Sodium 135 135 - 145 mEq/L   Potassium 4.3 3.5 - 5.1 mEq/L   Chloride 102 96 - 112 mEq/L   CO2 25 19 - 32 mEq/L   Glucose, Bld 86 70 - 99 mg/dL   BUN 8 6 - 23 mg/dL   Creatinine, Ser 0.60 0.40 - 1.20 mg/dL   Total Bilirubin 0.5 0.2 - 1.2 mg/dL   Alkaline Phosphatase 102 39 - 117 U/L   AST 15 0 - 37 U/L   ALT 12 0 - 35 U/L   Total Protein 7.9 6.0 - 8.3 g/dL   Albumin 4.6 3.5 - 5.2 g/dL   GFR 138.97 >60.00 mL/min   Calcium 9.8 8.4 - 10.5 mg/dL  Lipid panel     Status: None   Collection Time: 06/20/20 10:37 AM  Result Value Ref Range   Cholesterol 152 0 - 200 mg/dL    Comment: ATP III Classification       Desirable:  < 200 mg/dL               Borderline High:  200 - 239 mg/dL          High:  > = 240 mg/dL   Triglycerides 46.0 0 - 149 mg/dL    Comment: Normal:  <150 mg/dLBorderline High:  150 - 199 mg/dL   HDL 55.20 >39.00 mg/dL   VLDL 9.2 0.0 - 40.0 mg/dL   LDL Cholesterol 88 0 - 99 mg/dL   Total CHOL/HDL Ratio 3     Comment:                Men          Women1/2 Average Risk     3.4          3.3Average Risk          5.0          4.42X Average Risk          9.6          7.13X Average Risk          15.0          11.0                       NonHDL 96.95     Comment: NOTE:  Non-HDL goal should be 30 mg/dL higher than patient's LDL goal (i.e. LDL goal of < 70 mg/dL, would have non-HDL goal of < 100 mg/dL)  Hemoglobin A1c     Status: None   Collection Time: 06/20/20 10:37 AM  Result Value Ref Range   Hgb A1c MFr Bld 5.7 4.6 - 6.5 %    Comment: Glycemic Control Guidelines for People with Diabetes:Non Diabetic:  <6%Goal of Therapy: <7%Additional Action Suggested:  >8%   TSH     Status: None   Collection Time: 06/20/20 10:37 AM  Result Value Ref Range   TSH 0.80 0.35 - 4.50 uIU/mL    Magnesium     Status: None   Collection Time: 06/20/20 10:37 AM  Result Value Ref Range   Magnesium 1.8 1.5 - 2.5 mg/dL  Iron     Status: None   Collection Time: 06/20/20 10:37 AM  Result Value Ref Range   Iron 45 42 - 145 ug/dL  Urine Culture  Status: Abnormal   Collection Time: 06/20/20 10:47 AM   Specimen: Urine  Result Value Ref Range   MICRO NUMBER: [id number]    SPECIMEN QUALITY: Adequate    Sample Source NOT GIVEN    STATUS: FINAL    ISOLATE 1: Escherichia coli (A)     Comment: 50,000-100,000 CFU/mL of Escherichia coli      Susceptibility   Escherichia coli - URINE CULTURE, REFLEX    AMOX/CLAVULANIC 4 Sensitive     AMPICILLIN 4 Sensitive     AMPICILLIN/SULBACTAM <=2 Sensitive     CEFAZOLIN* <=4 Not Reportable      * For infections other than uncomplicated UTIcaused by E. coli, K. pneumoniae or P. mirabilis:Cefazolin is resistant if MIC > or = 8 mcg/mL.(Distinguishing susceptible versus intermediatefor isolates with MIC < or = 4 mcg/mL requiresadditional testing.)For uncomplicated UTI caused by E. coli,K. pneumoniae or P. mirabilis: Cefazolin issusceptible if MIC <32 mcg/mL and predictssusceptible to the oral agents cefaclor, cefdinir,cefpodoxime, cefprozil, cefuroxime, cephalexinand loracarbef.    CEFEPIME <=1 Sensitive     CEFTRIAXONE <=1 Sensitive     CIPROFLOXACIN <=0.25 Sensitive     LEVOFLOXACIN <=0.12 Sensitive     ERTAPENEM <=0.5 Sensitive     GENTAMICIN <=1 Sensitive     IMIPENEM <=0.25 Sensitive     NITROFURANTOIN <=16 Sensitive     PIP/TAZO <=4 Sensitive     TOBRAMYCIN <=1 Sensitive     TRIMETH/SULFA* <=20 Sensitive      * For infections other than uncomplicated UTIcaused by E. coli, K. pneumoniae or P. mirabilis:Cefazolin is resistant if MIC > or = 8 mcg/mL.(Distinguishing susceptible versus intermediatefor isolates with MIC < or = 4 mcg/mL requiresadditional testing.)For uncomplicated UTI caused by E. coli,K. pneumoniae or P. mirabilis: Cefazolin  issusceptible if MIC <32 mcg/mL and predictssusceptible to the oral agents cefaclor, cefdinir,cefpodoxime, cefprozil, cefuroxime, cephalexinand loracarbef.Legend:S = Susceptible  I = IntermediateR = Resistant  NS = Not susceptible* = Not tested  NR = Not reported**NN = See antimicrobic comments    Assessment/Plan: 1. Chronic migraine without aura without status migrainosus, not intractable Recent exacerbation of unspecified trigger.  Patient denies change to diet or hydration.  Denies increased stress levels.  Could potentially be related to the weather as she has had this to be a trigger before.  60 mg IM Toradol given to abort current migraine and also hopefully to break headache cycle.  Continue metoprolol at current dose.  Supportive measures and OTC medications reviewed.  If continuing to recur we will need to adjust her preventive medication. - ketorolac (TORADOL) injection 60 mg  This visit occurred during the SARS-CoV-2 public health emergency.  Safety protocols were in place, including screening questions prior to the visit, additional usage of staff PPE, and extensive cleaning of exam room while observing appropriate contact time as indicated for disinfecting solutions.     Leeanne Rio, PA-C

## 2020-07-09 NOTE — Patient Instructions (Addendum)
Please keep well-hydrated and get plenty of rest.  The Toradol given today should help abort the headache and hopefully break the current headache cycle.   Make sure you are eating a well-balanced diet.  Avoid fluorescent lighting as much as possible.   If recurring again we will need to either increase the Metprolol dose or start another preventive medication.   Hang in there! I am making a note to call your Thursday morning to check on you!  Remember, ER for any acutely worsening symptoms or if headache does not resolve with treatment given today.

## 2020-12-05 ENCOUNTER — Ambulatory Visit: Payer: BC Managed Care – PPO | Admitting: Physician Assistant

## 2020-12-05 ENCOUNTER — Other Ambulatory Visit: Payer: Self-pay

## 2020-12-05 ENCOUNTER — Encounter: Payer: Self-pay | Admitting: Physician Assistant

## 2020-12-05 VITALS — BP 117/80 | HR 72 | Temp 98.1°F | Resp 16 | Ht 59.0 in | Wt 172.4 lb

## 2020-12-05 DIAGNOSIS — K5900 Constipation, unspecified: Secondary | ICD-10-CM | POA: Diagnosis not present

## 2020-12-05 DIAGNOSIS — K2100 Gastro-esophageal reflux disease with esophagitis, without bleeding: Secondary | ICD-10-CM | POA: Diagnosis not present

## 2020-12-05 LAB — COMPREHENSIVE METABOLIC PANEL
ALT: 12 U/L (ref 0–35)
AST: 15 U/L (ref 0–37)
Albumin: 4.2 g/dL (ref 3.5–5.2)
Alkaline Phosphatase: 103 U/L (ref 39–117)
BUN: 9 mg/dL (ref 6–23)
CO2: 28 mEq/L (ref 19–32)
Calcium: 9.3 mg/dL (ref 8.4–10.5)
Chloride: 102 mEq/L (ref 96–112)
Creatinine, Ser: 0.63 mg/dL (ref 0.40–1.20)
GFR: 116.2 mL/min (ref >60.00)
Glucose, Bld: 89 mg/dL (ref 70–99)
Potassium: 4.4 mEq/L (ref 3.5–5.1)
Sodium: 137 mEq/L (ref 135–145)
Total Bilirubin: 0.4 mg/dL (ref 0.2–1.2)
Total Protein: 7.8 g/dL (ref 6.0–8.3)

## 2020-12-05 LAB — H. PYLORI ANTIBODY, IGG: H Pylori IgG: NEGATIVE

## 2020-12-05 LAB — TSH: TSH: 0.78 u[IU]/mL (ref 0.35–4.50)

## 2020-12-05 MED ORDER — RABEPRAZOLE SODIUM 20 MG PO TBEC
40.0000 mg | DELAYED_RELEASE_TABLET | Freq: Every day | ORAL | 1 refills | Status: DC
Start: 2020-12-05 — End: 2021-04-15

## 2020-12-05 NOTE — Patient Instructions (Signed)
Please go to the lab today for blood work.  I will call you with your results. We will alter treatment regimen(s) if indicated by your results.   Stop the Protonix. Start the Aciphex daily as directed. I want you to also take an OTC Famotidine 20 mg nightly. Follow dietary recommendations below.   I encourage you to increase hydration and the amount of fiber in your diet.  Start a daily probiotic (Align, Culturelle, Digestive Advantage, etc.). If no bowel movement within 24 hours, take 2 Tbs of Milk of Magnesia in a 4 oz glass of warmed prune juice every 2-3 days to help promote bowel movement. If no results within 24 hours, then repeat above regimen, adding a Dulcolax stool softener to regimen. If this does not promote a bowel movement, please call the office.   Food Choices for Gastroesophageal Reflux Disease, Adult When you have gastroesophageal reflux disease (GERD), the foods you eat and your eating habits are very important. Choosing the right foods can help ease your discomfort. Think about working with a nutrition specialist (dietitian) to help you make good choices. What are tips for following this plan?  Meals  Choose healthy foods that are low in fat, such as fruits, vegetables, whole grains, low-fat dairy products, and lean meat, fish, and poultry.  Eat small meals often instead of 3 large meals a day. Eat your meals slowly, and in a place where you are relaxed. Avoid bending over or lying down until 2-3 hours after eating.  Avoid eating meals 2-3 hours before bed.  Avoid drinking a lot of liquid with meals.  Cook foods using methods other than frying. Bake, grill, or broil food instead.  Avoid or limit: ? Chocolate. ? Peppermint or spearmint. ? Alcohol. ? Pepper. ? Black and decaffeinated coffee. ? Black and decaffeinated tea. ? Bubbly (carbonated) soft drinks. ? Caffeinated energy drinks and soft drinks.  Limit high-fat foods such as: ? Fatty meat or fried  foods. ? Whole milk, cream, butter, or ice cream. ? Nuts and nut butters. ? Pastries, donuts, and sweets made with butter or shortening.  Avoid foods that cause symptoms. These foods may be different for everyone. Common foods that cause symptoms include: ? Tomatoes. ? Oranges, lemons, and limes. ? Peppers. ? Spicy food. ? Onions and garlic. ? Vinegar. Lifestyle  Maintain a healthy weight. Ask your doctor what weight is healthy for you. If you need to lose weight, work with your doctor to do so safely.  Exercise for at least 30 minutes for 5 or more days each week, or as told by your doctor.  Wear loose-fitting clothes.  Do not smoke. If you need help quitting, ask your doctor.  Sleep with the head of your bed higher than your feet. Use a wedge under the mattress or blocks under the bed frame to raise the head of the bed. Summary  When you have gastroesophageal reflux disease (GERD), food and lifestyle choices are very important in easing your symptoms.  Eat small meals often instead of 3 large meals a day. Eat your meals slowly, and in a place where you are relaxed.  Limit high-fat foods such as fatty meat or fried foods.  Avoid bending over or lying down until 2-3 hours after eating.  Avoid peppermint and spearmint, caffeine, alcohol, and chocolate. This information is not intended to replace advice given to you by your health care provider. Make sure you discuss any questions you have with your health care provider. Document  Revised: 04/06/2019 Document Reviewed: 01/19/2017 Elsevier Patient Education  Kimberly Guerra.

## 2020-12-05 NOTE — Progress Notes (Signed)
Patient presents to clinic today c/o worsening of her GERD symptoms. Patient with chronic reflux, currently on a regimen of Protonix 40 mg daily. Endorses taking as directed and tolerating well. Was controlling symptoms until a few months ago. Notes now with breakthrough heartburn with occasional upper abdominal discomfort. Denies melena, hematochezia or tenesmus but notes constipation. Denies fever, chills, malaise. Denies use of NSAIDs. Rare alcohol use. Has increased exercise recently quite considerably. Denies late-night eating. Denies nighttime awakenings.   Past Medical History:  Diagnosis Date  . Anemia   . Asthma    prn inhaler  . History of gestational hypertension   . Irregular menstrual cycle   . Menorrhagia   . Migraines   . Seasonal allergies   . Sickle cell trait ([place])     Current Outpatient Medications on File Prior to Visit  Medication Sig Dispense Refill  . albuterol (VENTOLIN HFA) 108 (90 Base) MCG/ACT inhaler Inhale 2 puffs into the lungs every 6 (six) hours as needed for wheezing or shortness of breath. 18 g 1  . fexofenadine-pseudoephedrine (ALLEGRA-D 24) 180-240 MG 24 hr tablet Take 1 tablet by mouth daily.    . fluticasone (FLONASE) 50 MCG/ACT nasal spray Place 2 sprays into both nostrils daily. 16 g 0  . letrozole (FEMARA) 2.5 MG tablet Take 2.5 mg by mouth daily.  10  . metoprolol tartrate (LOPRESSOR) 50 MG tablet Take 1 tablet (50 mg total) by mouth 2 (two) times daily. 180 tablet 1  . mometasone (ASMANEX) 220 MCG/INH inhaler INHALE 2 PUFFS BY MOUTH INTO THE LUNGS DAILY 1 Inhaler 3  . Multiple Vitamins-Minerals (WOMENS MULTIVITAMIN PLUS) TABS Take by mouth daily.    . norethindrone (AYGESTIN) 5 MG tablet Take 1 tablet by mouth daily.     . pantoprazole (PROTONIX) 40 MG tablet Take 1 tablet (40 mg total) by mouth daily. 90 tablet 1  . albuterol (PROVENTIL) (5 MG/ML) 0.5% nebulizer solution Take 0.5 mLs (2.5 mg total) by nebulization every 6 (six) hours as needed  for wheezing or shortness of breath. 60 mL 0  . eletriptan (RELPAX) 20 MG tablet Take 1 tablet (20 mg total) by mouth once for 1 dose. May repeat in 2 hours if headache persists or recurs. 12 tablet 0   No current facility-administered medications on file prior to visit.    Allergies  Allergen Reactions  . Topamax [Topiramate]     Heart palpitations    Family History  Problem Relation Age of Onset  . Hypertension Mother   . Asthma Father   . Hypertension Father   . Asthma Brother   . Asthma Paternal Aunt   . Diabetes Paternal Aunt   . Cancer Maternal Grandfather        oral cancer  . Diabetes Maternal Grandfather   . Hypertension Maternal Grandfather     Social History   Socioeconomic History  . Marital status: Single    Spouse name: Not on file  . Number of children: 1  . Years of education: 60  . Highest education level: Not on file  Occupational History  . Occupation: Scientist, clinical (histocompatibility and immunogenetics): [employer]  Tobacco Use  . Smoking status: Never Smoker  . Smokeless tobacco: Never Used  Vaping Use  . Vaping Use: Never used  Substance and Sexual Activity  . Alcohol use: Yes    Alcohol/week: 3.0 standard drinks    Types: 3 Glasses of wine per week  . Drug use: No  . Sexual activity: Yes  Birth control/protection: None  Other Topics Concern  . Not on file  Social History Narrative   Lives with son (2010), employeed at [employer] part-time. Completed some college classes.    Social Determinants of Health   Financial Resource Strain: Not on file  Food Insecurity: Not on file  Transportation Needs: Not on file  Physical Activity: Not on file  Stress: Not on file  Social Connections: Not on file   Review of Systems - See HPI.  All other ROS are negative.  BP 117/80   Pulse 72   Temp 98.1 F (36.7 C) (Temporal)   Resp 16   Ht 4\' 11"  (1.499 m)   Wt 172 lb 6.4 oz (78.2 kg)   SpO2 98%   BMI 34.82 kg/m   Physical Exam Vitals reviewed.  Constitutional:       Appearance: Normal appearance.  HENT:     Head: Normocephalic and atraumatic.  Cardiovascular:     Rate and Rhythm: Normal rate and regular rhythm.     Pulses: Normal pulses.     Heart sounds: Normal heart sounds.  Pulmonary:     Effort: Pulmonary effort is normal.     Breath sounds: Normal breath sounds.  Abdominal:     General: Bowel sounds are normal. There is no distension.     Palpations: Abdomen is soft.     Tenderness: There is abdominal tenderness (mild epigastric and LUQ tenderness).  Musculoskeletal:     Cervical back: Neck supple.  Neurological:     General: No focal deficit present.     Mental Status: She is alert and oriented to person, place, and time.    Assessment/Plan: 1. Gastroesophageal reflux disease with esophagitis without hemorrhage Will stop Protonix. Start Aciphex. Famotidine OTC nightly. GERD diet reviewed. Recommend daily probiotic. Will check labs today to include CMP and H. Pylori IgG. May need referral to GI if symptoms not improving.  - Comprehensive metabolic panel - H. pylori antibody, IgG  2. Constipation, unspecified constipation type Mild. Will check TSH today. Bowel regimen reviewed with patient.  - TSH    This visit occurred during the SARS-CoV-2 public health emergency.  Safety protocols were in place, including screening questions prior to the visit, additional usage of staff PPE, and extensive cleaning of exam room while observing appropriate contact time as indicated for disinfecting solutions.     Leeanne Rio, PA-C

## 2020-12-23 ENCOUNTER — Telehealth: Payer: Self-pay | Admitting: Physician Assistant

## 2020-12-23 NOTE — Telephone Encounter (Signed)
Was tested on Wed 12/18/20. Currently having no taste, no smell, fatigue, cough. No more body aches and no fever.  Taking Dayquil and Theraflu for cough.

## 2020-12-23 NOTE — Telephone Encounter (Signed)
Patient has a cough, loss of taste and smell - no fever, no body aches - runny nose - stuffy - minor headache - patient tested positive for covid - she would like to know if there is anything she should be doing - please advise.

## 2020-12-23 NOTE — Telephone Encounter (Signed)
PCP recommendations given to patient, she is agreeable.

## 2020-12-23 NOTE — Telephone Encounter (Signed)
Recommend she keep hydrated and rest. Start following vitamin regimen: Vitamin D3 1000 units daily, vitamin C 1000 mg daily and an over-the-counter zinc supplement. Given young age and minor symptoms, not high risk.  Continue supportive measures discussed above.  Quarantine until at least 10 days from symptom onset, no fever for 48 hours and symptoms progressively improving/resolving. If she feels anything is worsening or not improving she needs a video visit with me. ER for any severe shortness of breath or chest pain

## 2020-12-26 ENCOUNTER — Ambulatory Visit: Payer: BC Managed Care – PPO | Admitting: Physician Assistant

## 2020-12-29 ENCOUNTER — Other Ambulatory Visit: Payer: Self-pay | Admitting: Physician Assistant

## 2021-01-15 ENCOUNTER — Telehealth (INDEPENDENT_AMBULATORY_CARE_PROVIDER_SITE_OTHER): Payer: BC Managed Care – PPO | Admitting: Physician Assistant

## 2021-01-15 ENCOUNTER — Encounter: Payer: Self-pay | Admitting: Physician Assistant

## 2021-01-15 ENCOUNTER — Other Ambulatory Visit: Payer: Self-pay

## 2021-01-15 DIAGNOSIS — K219 Gastro-esophageal reflux disease without esophagitis: Secondary | ICD-10-CM

## 2021-01-15 DIAGNOSIS — K5900 Constipation, unspecified: Secondary | ICD-10-CM

## 2021-01-15 MED ORDER — MOMETASONE FUROATE 220 MCG/INH IN AEPB: INHALATION_SPRAY | RESPIRATORY_TRACT | 3 refills | Status: DC

## 2021-01-15 NOTE — Progress Notes (Signed)
I have discussed the procedure for the virtual visit with the patient who has given consent to proceed with assessment and treatment.   Krysteena Stalker S Abdulkarim Eberlin, CMA     

## 2021-01-15 NOTE — Progress Notes (Signed)
Virtual Visit via Video   I connected with patient on 01/15/21 at  1:30 PM EST by a video enabled telemedicine application and verified that I am speaking with the correct person using two identifiers.  Location patient: Home Location provider: [facility], Office Persons participating in the virtual visit: Patient, Provider, [place] (Patina Moore)  I discussed the limitations of evaluation and management by telemedicine and the availability of in person appointments. The patient expressed understanding and agreed to proceed.  Subjective:   HPI:   Patient presents via [place] today to follow-up regarding GERD symptoms.  At last visit regimen was changed from Protonix daily to Aciphex.  Patient was also instructed to take famotidine at bedtime.  Patient endorses taking medications as directed, with noted improvement in symptoms. Still occasionally with breakthrough symptoms despite medication use. No longer having pain, nausea or vomiting.   Is working on diet and hydration to help with intermittent constipation. Still feels like having mild issue. Denies tenesmus, melena or hematochezia.   ROS:   See pertinent positives and negatives per HPI.  Patient Active Problem List   Diagnosis Date Noted  . Nocturnal leg cramps 06/20/2020  . Endometriosis 09/28/2017  . Screen for STD (sexually transmitted disease) 08/14/2016  . Intractable chronic migraine without aura and without status migrainosus 10/07/2015  . Visit for preventive health examination 06/21/2015  . Encounter for cervical Pap smear with pelvic exam 06/21/2015  . Rhinitis, allergic 06/21/2015  . Migraines 02/08/2014    Social History   Tobacco Use  . Smoking status: Never Smoker  . Smokeless tobacco: Never Used  Substance Use Topics  . Alcohol use: Yes    Alcohol/week: 3.0 standard drinks    Types: 3 Glasses of wine per week    Current Outpatient Medications:  .  albuterol (PROVENTIL) (5 MG/ML) 0.5%  nebulizer solution, Take 0.5 mLs (2.5 mg total) by nebulization every 6 (six) hours as needed for wheezing or shortness of breath., Disp: 60 mL, Rfl: 0 .  albuterol (VENTOLIN HFA) 108 (90 Base) MCG/ACT inhaler, Inhale 2 puffs into the lungs every 6 (six) hours as needed for wheezing or shortness of breath., Disp: 18 g, Rfl: 1 .  eletriptan (RELPAX) 20 MG tablet, Take 1 tablet (20 mg total) by mouth once for 1 dose. May repeat in 2 hours if headache persists or recurs., Disp: 12 tablet, Rfl: 0 .  famotidine (PEPCID) 40 MG tablet, Take 40 mg by mouth at bedtime., Disp: , Rfl:  .  fexofenadine-pseudoephedrine (ALLEGRA-D 24) 180-240 MG 24 hr tablet, Take 1 tablet by mouth daily., Disp: , Rfl:  .  fluticasone (FLONASE) 50 MCG/ACT nasal spray, Place 2 sprays into both nostrils daily., Disp: 16 g, Rfl: 0 .  letrozole (FEMARA) 2.5 MG tablet, Take 2.5 mg by mouth daily., Disp: , Rfl: 10 .  metoprolol tartrate (LOPRESSOR) 50 MG tablet, Take 1 tablet (50 mg total) by mouth 2 (two) times daily., Disp: 180 tablet, Rfl: 1 .  mometasone (ASMANEX) 220 MCG/INH inhaler, INHALE 2 PUFFS BY MOUTH INTO THE LUNGS DAILY, Disp: 1 Inhaler, Rfl: 3 .  Multiple Vitamins-Minerals (WOMENS MULTIVITAMIN PLUS) TABS, Take by mouth daily., Disp: , Rfl:  .  norethindrone (AYGESTIN) 5 MG tablet, Take 1 tablet by mouth daily. , Disp: , Rfl:  .  RABEprazole (ACIPHEX) 20 MG tablet, Take 2 tablets (40 mg total) by mouth daily., Disp: 60 tablet, Rfl: 1  Allergies  Allergen Reactions  . Topamax [Topiramate]     Heart  palpitations    Objective:   There were no vitals taken for this visit.  Patient is well-developed, well-nourished in no acute distress.  Resting comfortably at home.  Head is normocephalic, atraumatic.  No labored breathing.  Speech is clear and coherent with logical content.  Patient is alert and oriented at baseline.   Assessment and Plan:   1. Gastroesophageal reflux disease without esophagitis Symptoms  improved.  No residual heartburn.  Does occasionally have sensation of reflux.  Given chronic symptoms despite change in medication will, do want her to have an assessment with gastroenterology.  Referral placed.  Reviewed dietary recommendations.  Continue current medication regimen.  2. Constipation, unspecified constipation type Mild and intermittent.  Has had thyroid checked recently and a normal range.  Recommend MiraLAX 4 times daily over the next week.  Dietary recommendations reviewed.  Follow-up with GI.    Leeanne Rio, PA-C 01/15/2021

## 2021-01-16 ENCOUNTER — Ambulatory Visit: Payer: BC Managed Care – PPO | Admitting: Physician Assistant

## 2021-03-06 ENCOUNTER — Ambulatory Visit: Payer: BC Managed Care – PPO | Admitting: Gastroenterology

## 2021-04-15 ENCOUNTER — Encounter: Payer: Self-pay | Admitting: Gastroenterology

## 2021-04-15 ENCOUNTER — Ambulatory Visit (INDEPENDENT_AMBULATORY_CARE_PROVIDER_SITE_OTHER): Payer: BC Managed Care – PPO | Admitting: Gastroenterology

## 2021-04-15 VITALS — BP 140/90 | HR 84 | Ht 59.0 in | Wt 173.0 lb

## 2021-04-15 DIAGNOSIS — K219 Gastro-esophageal reflux disease without esophagitis: Secondary | ICD-10-CM

## 2021-04-15 DIAGNOSIS — K625 Hemorrhage of anus and rectum: Secondary | ICD-10-CM

## 2021-04-15 MED ORDER — POLYETHYLENE GLYCOL 3350 17 GM/SCOOP PO POWD: ORAL | 3 refills | Status: DC

## 2021-04-15 MED ORDER — RABEPRAZOLE SODIUM 20 MG PO TBEC: 40.0000 mg | DELAYED_RELEASE_TABLET | Freq: Two times a day (BID) | ORAL | 2 refills | Status: DC

## 2021-04-15 NOTE — Progress Notes (Signed)
reflux  Referring Provider: Delorse Limber Primary Care Physician:  Brunetta Jeans, PA-C  Reason for Consultation:  Reflux   IMPRESSION:  Reflux not responding to medical therapy    - given history of asthma and allergies, must consider EOE. Recent exacerbation correlates with weight gaine. Will increase PPI to BID dosing schedule. Plan EGD with biopsies to exclude eosinophilic esophagitis.    Constipation with intermittent rectal bleeding    - The differential for rectal bleeding is broad.  It includes outlet sources such as fissure or hemorrhoids, as well as polyps, mass, ulcers, and colitis.  Given this differential I am recommending a colonoscopy. I've encouraged her to use a daily dose of Miralax in the meantime  PLAN: - Increase Aciphex to 40 mg BID - Lifestyle modifications including working towards health weight - Increase Miralax to 17g daily - Colonoscopy with concurrent EGD with esophageal biopsies if not responding to increased PPI dose  Please see the "Patient Instructions" section for addition details about the plan.  HPI: Kimberly Guerra is a 35 y.o. female referred by PA Hassell Done for further evaluation of reflux and constipation.  The history is obtained through the patient and review of her electronic health record. She has headaches, seasonal allergies, asthma, endometriosis. Works at [employer].   Ongoing reflux symptoms over several months despite a trial of Aciphex QD, Protonix QD, and the addition of famotidine at bedtime. Symptoms include brash, regurgitation, nausea, some intermittent and intermittent globus. There is an unintentional weight gain during this time. Symptoms are severe enough that she's concerns that she is "tearing up her insides." No evidence for GI bleeding, iron deficiency anemia, anorexia, unexplained weight loss, dysphagia, odynophagia, dysphonia, or gastrointestinal cancer in a first-degree relative.  Has chronic constipation  that she manages with diet and hydration. Intermittently sees blood in the stool without associated rectal pain.  Stool described as pebbles. Some straining. Temporarily improves with Miralax PRN. She has never used Miralax daily.   Normal TSH and calcium in 2021 CBC 2021 was normal except for MCV 75, platelets 467  No prior abdominal imaging or colonoscopy.   Mom takes Protonix for GERD. No known family history of colon cancer or polyps. No family history of uterine/endometrial cancer, pancreatic cancer or gastric/stomach cancer.   Past Medical History:  Diagnosis Date  . Anemia   . Asthma    prn inhaler  . History of gestational hypertension   . Irregular menstrual cycle   . Menorrhagia   . Migraines   . Seasonal allergies   . Sickle cell trait [facility])     Past Surgical History:  Procedure Laterality Date  . CARPAL TUNNEL RELEASE Right 03/25/2015   Procedure:  ENDOSCOPIC CARPAL TUNNEL RELEASE;  Surgeon: Milly Jakob, MD;  Location: [facility];  Service: Orthopedics;  Laterality: Right;  . CESAREAN SECTION  01/02/2009  . DORSAL COMPARTMENT RELEASE Right 03/25/2015   Procedure: RIGHT WRIST DEQUERVAIN RELEASE AND ;  Surgeon: Milly Jakob, MD;  Location: [facility];  Service: Orthopedics;  Laterality: Right;  . HYSTEROSCOPY WITH D & C N/A 06/24/2017   Procedure: DILATATION AND CURETTAGE /HYSTEROSCOPY  pelvic biopsy and ablation of endometriosis;  Surgeon: Thurnell Lose, MD;  Location: [facility];  Service: Gynecology;  Laterality: N/A;  . LAPAROSCOPY N/A 06/24/2017   Procedure: LAPAROSCOPY DIAGNOSTIC;  Surgeon: Thurnell Lose, MD;  Location: [facility];  Service: Gynecology;  Laterality: N/A;  . WISDOM TOOTH EXTRACTION  Current Outpatient Medications  Medication Sig Dispense Refill  . albuterol (VENTOLIN HFA) 108 (90 Base) MCG/ACT inhaler Inhale 2 puffs into the lungs every 6 (six) hours as needed for wheezing or  shortness of breath. 18 g 1  . famotidine (PEPCID) 40 MG tablet Take 40 mg by mouth at bedtime.    . fexofenadine-pseudoephedrine (ALLEGRA-D 24) 180-240 MG 24 hr tablet Take 1 tablet by mouth daily.    . fluticasone (FLONASE) 50 MCG/ACT nasal spray Place 2 sprays into both nostrils daily. 16 g 0  . letrozole (FEMARA) 2.5 MG tablet Take 2.5 mg by mouth daily.  10  . metoprolol tartrate (LOPRESSOR) 50 MG tablet Take 1 tablet (50 mg total) by mouth 2 (two) times daily. 180 tablet 1  . mometasone (ASMANEX) 220 MCG/INH inhaler INHALE 2 PUFFS BY MOUTH INTO THE LUNGS DAILY 1 each 3  . Multiple Vitamins-Minerals (WOMENS MULTIVITAMIN PLUS) TABS Take by mouth daily.    . norethindrone (AYGESTIN) 5 MG tablet Take 1 tablet by mouth daily.     . RABEprazole (ACIPHEX) 20 MG tablet Take 2 tablets (40 mg total) by mouth daily. 60 tablet 1  . albuterol (PROVENTIL) (5 MG/ML) 0.5% nebulizer solution Take 0.5 mLs (2.5 mg total) by nebulization every 6 (six) hours as needed for wheezing or shortness of breath. 60 mL 0  . eletriptan (RELPAX) 20 MG tablet Take 1 tablet (20 mg total) by mouth once for 1 dose. May repeat in 2 hours if headache persists or recurs. 12 tablet 0   No current facility-administered medications for this visit.    Allergies as of 04/15/2021 - Review Complete 04/15/2021  Allergen Reaction Noted  . Topamax [topiramate]  08/08/2018    Family History  Problem Relation Age of Onset  . Hypertension Mother   . Asthma Father   . Hypertension Father   . Asthma Brother   . Asthma Paternal Aunt   . Diabetes Paternal Aunt   . Cancer Maternal Grandfather        oral cancer  . Diabetes Maternal Grandfather   . Hypertension Maternal Grandfather      Review of Systems: 12 system ROS is negative except as noted above.   Physical Exam: General:   Alert,  well-nourished, pleasant and cooperative in NAD Head:  Normocephalic and atraumatic. Eyes:  Sclera clear, no icterus.   Conjunctiva  pink. Ears:  Normal auditory acuity. Nose:  No deformity, discharge,  or lesions. Mouth:  No deformity or lesions.   Neck:  Supple; no masses or thyromegaly. Lungs:  Clear throughout to auscultation.   No wheezes. Heart:  Regular rate and rhythm; no murmurs. Abdomen:  Soft, nontender, nondistended, normal bowel sounds, no rebound or guarding. No hepatosplenomegaly.   Rectal:  Deferred  Msk:  Symmetrical. No boney deformities LAD: No inguinal or umbilical LAD Extremities:  No clubbing or edema. Neurologic:  Alert and  oriented x4;  grossly nonfocal Skin:  Intact without significant lesions or rashes. Psych:  Alert and cooperative. Normal mood and affect.     Rama Sorci L. Tarri Glenn, MD, MPH 04/15/2021, 1:49 PM

## 2021-04-15 NOTE — Patient Instructions (Addendum)
It was a pleasure to meet you today. Based on our discussion, I am providing you with my recommendations below:  RECOMMENDATION(S):   I would like to increase your Aciphex to 40mg  2 times daily and to increase Miralax to 17g daily. Below are the instructions for these medications.  I would like to evaluate your symptoms with an endoscopy and colonoscopy. I have provided you with tips below for your colonoscopy.  COLONOSCOPY AND ENDOSCOPY:   . You have been scheduled for an endoscopy and a colonoscopy. Please follow the written instructions given to you at your visit today.  PREP:   . Please pick up your prep supplies at the pharmacy within the next 1-3 days.  INHALERS:   . If you use inhalers (even only as needed), please bring them with you on the day of your procedure.  COLONOSCOPY TIPS:  . To reduce nausea and dehydration, stay well hydrated for 3-4 days prior to the exam.  . To prevent skin/hemorrhoid irritation - prior to wiping, put A&Dointment or vaseline on the toilet paper. Marland Kitchen Keep a towel or pad on the bed.  Marland Kitchen BEFORE STARTING YOUR PREP, drink  64oz of clear liquids in the morning. This will help to flush the colon and will ensure you are well hydrated!!!!  NOTE - This is in addition to the fluids required for to complete your prep. . Use of a flavored hard candy, such as grape Anise Salvo, can counteract some of the flavor of the prep and may prevent some nausea.   OVER THE COUNTER MEDICATION(S):   Please purchase the following medications over the counter and take as directed:  Marland Kitchen Miralax - Mix 1 capful in 8 ounces of juice and water daily  PRESCRIPTION MEDICATION(S):   We have sent the following medication(s) to your pharmacy:  . Aciphex - take 40mg  2 times daily  NOTE: If your medication(s) requires a PRIOR AUTHORIZATION, we will receive notification from your pharmacy. Once received, the process to submit for approval may take up to 7-10 business days. You will  be contacted about any denials we have received from your insurance company as well as alternatives recommended by your provider.  BMI:  . If you are age 85 or younger, your body mass index should be between 19-25. Your There is no height or weight on file to calculate BMI. If this is out of the aformentioned range listed, please consider follow up with your Primary Care Provider.   Thank you for trusting me with your gastrointestinal care!    Thornton Park, MD, MPH

## 2021-06-19 ENCOUNTER — Encounter: Payer: Self-pay | Admitting: Gastroenterology

## 2021-06-26 ENCOUNTER — Other Ambulatory Visit: Payer: Self-pay

## 2021-06-26 ENCOUNTER — Ambulatory Visit (AMBULATORY_SURGERY_CENTER): Payer: BC Managed Care – PPO | Admitting: Gastroenterology

## 2021-06-26 ENCOUNTER — Encounter: Payer: Self-pay | Admitting: Gastroenterology

## 2021-06-26 VITALS — BP 114/59 | HR 78 | Temp 97.5°F | Resp 14 | Ht 59.0 in | Wt 173.0 lb

## 2021-06-26 DIAGNOSIS — K635 Polyp of colon: Secondary | ICD-10-CM | POA: Diagnosis not present

## 2021-06-26 DIAGNOSIS — K5900 Constipation, unspecified: Secondary | ICD-10-CM | POA: Diagnosis not present

## 2021-06-26 DIAGNOSIS — K259 Gastric ulcer, unspecified as acute or chronic, without hemorrhage or perforation: Secondary | ICD-10-CM

## 2021-06-26 DIAGNOSIS — D125 Benign neoplasm of sigmoid colon: Secondary | ICD-10-CM

## 2021-06-26 DIAGNOSIS — K219 Gastro-esophageal reflux disease without esophagitis: Secondary | ICD-10-CM

## 2021-06-26 DIAGNOSIS — K648 Other hemorrhoids: Secondary | ICD-10-CM

## 2021-06-26 DIAGNOSIS — K625 Hemorrhage of anus and rectum: Secondary | ICD-10-CM | POA: Diagnosis not present

## 2021-06-26 DIAGNOSIS — K319 Disease of stomach and duodenum, unspecified: Secondary | ICD-10-CM | POA: Diagnosis not present

## 2021-06-26 MED ORDER — SODIUM CHLORIDE 0.9 % IV SOLN: 500.0000 mL | Freq: Once | INTRAVENOUS | Status: DC

## 2021-06-26 NOTE — Progress Notes (Signed)
Called to room to assist during endoscopic procedure.  Patient ID and intended procedure confirmed with present staff. Received instructions for my participation in the procedure from the performing physician.  

## 2021-06-26 NOTE — Op Note (Signed)
[facility] Patient Name: Kimberly Guerra Procedure Date: 06/26/2021 1:50 PM MRN: [id number] Endoscopist: Thornton Park MD, MD Age: 35 Referring MD:  Date of Birth: 11/12/1986 Gender: Female Account #: [id number] Procedure:                Colonoscopy Indications:              Rectal bleeding, Incidental constipation noted Medicines:                Monitored Anesthesia Care Procedure:                Pre-Anesthesia Assessment:                           - Prior to the procedure, a History and Physical                            was performed, and patient medications and                            allergies were reviewed. The patient's tolerance of                            previous anesthesia was also reviewed. The risks                            and benefits of the procedure and the sedation                            options and risks were discussed with the patient.                            All questions were answered, and informed consent                            was obtained. Prior Anticoagulants: The patient has                            taken no previous anticoagulant or antiplatelet                            agents. ASA Grade Assessment: II - A patient with                            mild systemic disease. After reviewing the risks                            and benefits, the patient was deemed in                            satisfactory condition to undergo the procedure.                           After obtaining informed consent, the colonoscope  was passed under direct vision. Throughout the                            procedure, the patient's blood pressure, pulse, and                            oxygen saturations were monitored continuously. The                            CF [id number] #[id number] was introduced through the anus                            and advanced to the 3 cm into the ileum. The                             colonoscopy was performed without difficulty. The                            patient tolerated the procedure well. The quality                            of the bowel preparation was excellent. The                            terminal ileum, ileocecal valve, appendiceal                            orifice, and rectum were photographed. Scope In: 2:20:22 PM Scope Out: 2:33:18 PM Scope Withdrawal Time: 0 hours 11 minutes 21 seconds  Total Procedure Duration: 0 hours 12 minutes 56 seconds  Findings:                 The perianal and digital rectal examinations were                            normal.                           A 2 mm polyp was found in the rectum. The polyp was                            flat. The polyp was removed with a cold snare.                            Resection and retrieval were complete. Estimated                            blood loss was minimal.                           Internal hemorrhoids were found.                           The exam was otherwise without abnormality on  direct and retroflexion views. Complications:            No immediate complications. Estimated blood loss:                            Minimal. Estimated Blood Loss:     Estimated blood loss was minimal. Impression:               - One 2 mm polyp in the rectum, removed with a cold                            snare. Resected and retrieved.                           - Internal hemorrhoids, the likely source of                            bleeding.                           - The examination was otherwise normal on direct                            and retroflexion views. Recommendation:           - Patient has a contact number available for                            emergencies. The signs and symptoms of potential                            delayed complications were discussed with the                            patient. Return to normal activities tomorrow.                             Written discharge instructions were provided to the                            patient.                           - Resume previous diet.                           - Continue present medications.                           - Await pathology results.                           - Repeat colonoscopy date to be determined after                            pending pathology results are reviewed for  surveillance.                           - Start using Anusol HC 2.5% applied sparingly to                            your rectum twice daily.                           - Sitz baths may provide some additional relief                            when you have bleeding.                           - If you would like more information,                            MyGIHealth.com and UpToDate.com have good                            information about hemorrhoids.                           - Emerging evidence supports eating a diet of                            fruits, vegetables, grains, calcium, and yogurt                            while reducing red meat and alcohol may reduce the                            risk of colon cancer. Thornton Park MD, MD 06/26/2021 2:41:01 PM This report has been signed electronically.

## 2021-06-26 NOTE — Patient Instructions (Signed)
YOU HAD AN ENDOSCOPIC PROCEDURE TODAY AT THE [facility]:   Refer to the procedure report that was given to you for any specific questions about what was found during the examination.  If the procedure report does not answer your questions, please call your gastroenterologist to clarify.  If you requested that your care partner not be given the details of your procedure findings, then the procedure report has been included in a sealed envelope for you to review at your convenience later.  YOU SHOULD EXPECT: Some feelings of bloating in the abdomen. Passage of more gas than usual.  Walking can help get rid of the air that was put into your GI tract during the procedure and reduce the bloating. If you had a lower endoscopy (such as a colonoscopy or flexible sigmoidoscopy) you may notice spotting of blood in your stool or on the toilet paper. If you underwent a bowel prep for your procedure, you may not have a normal bowel movement for a few days.  Please Note:  You might notice some irritation and congestion in your nose or some drainage.  This is from the oxygen used during your procedure.  There is no need for concern and it should clear up in a day or so.  SYMPTOMS TO REPORT IMMEDIATELY:   Following lower endoscopy (colonoscopy or flexible sigmoidoscopy):  Excessive amounts of blood in the stool  Significant tenderness or worsening of abdominal pains  Swelling of the abdomen that is new, acute  Fever of 100F or higher   Following upper endoscopy (EGD)  Vomiting of blood or coffee ground material  New chest pain or pain under the shoulder blades  Painful or persistently difficult swallowing  New shortness of breath  Fever of 100F or higher  Black, tarry-looking stools  For urgent or emergent issues, a gastroenterologist can be reached at any hour by calling [phone]. Do not use MyChart messaging for urgent concerns.    DIET:  We do recommend a small meal at first, but  then you may proceed to your regular diet.  Drink plenty of fluids but you should avoid alcoholic beverages for 24 hours.  ACTIVITY:  You should plan to take it easy for the rest of today and you should NOT DRIVE or use heavy machinery until tomorrow (because of the sedation medicines used during the test).    FOLLOW UP: Our staff will call the number listed on your records 48-72 hours following your procedure to check on you and address any questions or concerns that you may have regarding the information given to you following your procedure. If we do not reach you, we will leave a message.  We will attempt to reach you two times.  During this call, we will ask if you have developed any symptoms of COVID 19. If you develop any symptoms (ie: fever, flu-like symptoms, shortness of breath, cough etc.) before then, please call [phone].  If you test positive for Covid 19 in the 2 weeks post procedure, please call and report this information to us.    If any biopsies were taken you will be contacted by phone or by letter within the next 1-3 weeks.  Please call us at [phone] if you have not heard about the biopsies in 3 weeks.    SIGNATURES/CONFIDENTIALITY: You and/or your care partner have signed paperwork which will be entered into your electronic medical record.  These signatures attest to the fact that that the information above on   your After Visit Summary has been reviewed and is understood.  Full responsibility of the confidentiality of this discharge information lies with you and/or your care-partner. 

## 2021-06-26 NOTE — Progress Notes (Signed)
A/ox3, pleased with MAC, report to RN 

## 2021-06-26 NOTE — Op Note (Signed)
[facility] Patient Name: Kimberly Guerra Procedure Date: 06/26/2021 1:59 PM MRN: [id number] Endoscopist: Thornton Park MD, MD Age: 35 Referring MD:  Date of Birth: April 17, 1986 Gender: Female Account #: [id number] Procedure:                Upper GI endoscopy Indications:              Esophageal reflux symptoms that persist despite                            appropriate therapy with history of asthma and                            allergies Medicines:                Monitored Anesthesia Care Procedure:                Pre-Anesthesia Assessment:                           - Prior to the procedure, a History and Physical                            was performed, and patient medications and                            allergies were reviewed. The patient's tolerance of                            previous anesthesia was also reviewed. The risks                            and benefits of the procedure and the sedation                            options and risks were discussed with the patient.                            All questions were answered, and informed consent                            was obtained. Prior Anticoagulants: The patient has                            taken no previous anticoagulant or antiplatelet                            agents. ASA Grade Assessment: II - A patient with                            mild systemic disease. After reviewing the risks                            and benefits, the patient was deemed in  satisfactory condition to undergo the procedure.                           After obtaining informed consent, the endoscope was                            passed under direct vision. Throughout the                            procedure, the patient's blood pressure, pulse, and                            oxygen saturations were monitored continuously. The                            GIF [id number] #[id number] was introduced through  the                            mouth, and advanced to the third part of duodenum.                            The upper GI endoscopy was accomplished without                            difficulty. The patient tolerated the procedure                            well. Scope In: Scope Out: Findings:                 The examined esophagus was normal. Z-line is                            located 34 cm from the incisors. Biopsies were                            obtained from the proximal and distal esophagus                            with cold forceps for histology of suspected                            eosinophilic esophagitis.                           One non-bleeding cratered gastric ulcer with no                            stigmata of bleeding was found in the gastric                            antrum. The lesion was 2 mm in largest dimension.                            Biopsies were taken from  the antrum, body, and                            fundus with a cold forceps for histology. Estimated                            blood loss was minimal.                           The examined duodenum was normal. Complications:            No immediate complications. Estimated blood loss:                            Minimal. Estimated Blood Loss:     Estimated blood loss was minimal. Impression:               - Normal esophagus. Biopsied.                           - Non-bleeding gastric ulcer with no stigmata of                            bleeding. Biopsied.                           - Normal examined duodenum. Recommendation:           - Patient has a contact number available for                            emergencies. The signs and symptoms of potential                            delayed complications were discussed with the                            patient. Return to normal activities tomorrow.                            Written discharge instructions were provided to the                             patient.                           - Resume previous diet.                           - Continue present medications. Continue Aciphex 40                            mg BID for at least 10 weeks.                           - Avoid all NSAIDs.                           -  Await pathology results. Thornton Park MD, MD 06/26/2021 2:35:58 PM This report has been signed electronically.

## 2021-06-26 NOTE — Progress Notes (Signed)
VS by CW  I have reviewed the patient's medical history in detail and updated the computerized patient record.  

## 2021-07-21 ENCOUNTER — Encounter: Payer: Self-pay | Admitting: Gastroenterology

## 2021-08-11 ENCOUNTER — Encounter: Payer: BC Managed Care – PPO | Admitting: Nurse Practitioner

## 2021-10-16 ENCOUNTER — Encounter: Payer: Self-pay | Admitting: Nurse Practitioner

## 2021-10-16 ENCOUNTER — Ambulatory Visit (INDEPENDENT_AMBULATORY_CARE_PROVIDER_SITE_OTHER): Payer: BC Managed Care – PPO | Admitting: Nurse Practitioner

## 2021-10-16 ENCOUNTER — Other Ambulatory Visit: Payer: Self-pay

## 2021-10-16 VITALS — BP 118/64 | HR 74 | Temp 98.4°F | Ht <= 58 in | Wt 162.4 lb

## 2021-10-16 DIAGNOSIS — G43709 Chronic migraine without aura, not intractable, without status migrainosus: Secondary | ICD-10-CM

## 2021-10-16 DIAGNOSIS — Z0000 Encounter for general adult medical examination without abnormal findings: Secondary | ICD-10-CM

## 2021-10-16 DIAGNOSIS — Z13220 Encounter for screening for lipoid disorders: Secondary | ICD-10-CM

## 2021-10-16 DIAGNOSIS — Z86010 Personal history of colonic polyps: Secondary | ICD-10-CM

## 2021-10-16 DIAGNOSIS — Z8669 Personal history of other diseases of the nervous system and sense organs: Secondary | ICD-10-CM | POA: Insufficient documentation

## 2021-10-16 DIAGNOSIS — Z0001 Encounter for general adult medical examination with abnormal findings: Secondary | ICD-10-CM

## 2021-10-16 DIAGNOSIS — Z136 Encounter for screening for cardiovascular disorders: Secondary | ICD-10-CM | POA: Diagnosis not present

## 2021-10-16 DIAGNOSIS — K219 Gastro-esophageal reflux disease without esophagitis: Secondary | ICD-10-CM

## 2021-10-16 LAB — COMPREHENSIVE METABOLIC PANEL
ALT: 11 U/L (ref 0–35)
AST: 14 U/L (ref 0–37)
Albumin: 4.3 g/dL (ref 3.5–5.2)
Alkaline Phosphatase: 86 U/L (ref 39–117)
BUN: 7 mg/dL (ref 6–23)
CO2: 27 mEq/L (ref 19–32)
Calcium: 9.6 mg/dL (ref 8.4–10.5)
Chloride: 103 mEq/L (ref 96–112)
Creatinine, Ser: 0.71 mg/dL (ref 0.40–1.20)
GFR: 110.7 mL/min (ref >60.00)
Glucose, Bld: 79 mg/dL (ref 70–99)
Potassium: 4.7 mEq/L (ref 3.5–5.1)
Sodium: 136 mEq/L (ref 135–145)
Total Bilirubin: 0.5 mg/dL (ref 0.2–1.2)
Total Protein: 7.6 g/dL (ref 6.0–8.3)

## 2021-10-16 LAB — CBC
HCT: 34.5 % — ABNORMAL LOW (ref 36.0–46.0)
Hemoglobin: 11.1 g/dL — ABNORMAL LOW (ref 12.0–15.0)
MCHC: 32.1 g/dL (ref 30.0–36.0)
MCV: 71.1 fl — ABNORMAL LOW (ref 78.0–100.0)
Platelets: 507 10*3/uL — ABNORMAL HIGH (ref 150.0–400.0)
RBC: 4.86 Mil/uL (ref 3.87–5.11)
RDW: 16.9 % — ABNORMAL HIGH (ref 11.5–15.5)
WBC: 7.5 10*3/uL (ref 4.0–10.5)

## 2021-10-16 LAB — LIPID PANEL
Cholesterol: 157 mg/dL (ref 0–200)
HDL: 55.8 mg/dL (ref >39.00)
LDL Cholesterol: 91 mg/dL (ref 0–99)
NonHDL: 101.09
Total CHOL/HDL Ratio: 3
Triglycerides: 49 mg/dL (ref 0.0–149.0)
VLDL: 9.8 mg/dL (ref 0.0–40.0)

## 2021-10-16 LAB — TSH: TSH: 0.47 u[IU]/mL (ref 0.35–5.50)

## 2021-10-16 MED ORDER — METOPROLOL TARTRATE 50 MG PO TABS: 50.0000 mg | ORAL_TABLET | Freq: Two times a day (BID) | ORAL | 3 refills | Status: DC

## 2021-10-16 MED ORDER — ELETRIPTAN HYDROBROMIDE 20 MG PO TABS: 20.0000 mg | ORAL_TABLET | Freq: Once | ORAL | 0 refills | Status: DC

## 2021-10-16 NOTE — Progress Notes (Signed)
Subjective:    Patient ID: Kimberly Guerra, female    DOB: 10/13/1986, 35 y.o.   MRN: [id number]  Patient presents today for CPE   HPI Migraines Controlled with metoprolol BID and relpax prn Has maybe 65migrane per month Refill sent  Endometriosis followed by [facility] in [place] She stopped progestin and Femara tabs 53months ago due to side effects (fatigue and insomnia). Has menstrual cycle monthly, bleeding x 3-4days, painful cycle for first 2days.  Hx of colonic polyp Colonoscopy completed 08/2021: One 2 mm polyp in the rectum. Pathology report: hyperplastic polyp.  GERD (gastroesophageal reflux disease) Stable with famotidine and aciphex. EGD completed 08/2021: non bleeding pyloric ulcer, negative esophagus and stomach biopsy.  Vision:up to date Dental:will schedule Diet:regular Exercise:walking 2-3x/week, 61min Weight:  Wt Readings from Last 3 Encounters:  10/16/21 162 lb 6.4 oz (73.7 kg)  06/26/21 173 lb (78.5 kg)  04/15/21 173 lb (78.5 kg)    Sexual History (orientation,birth control, marital status, STD):no STD screen needed today, deferred breast and pelvic exam to GYN, GYN: [facility], has appt on 11/2021  Depression/Suicide: Depression screen [facility] 2/9 10/16/2021 12/05/2020 06/20/2020 10/03/2018 09/28/2017 12/01/2016 06/21/2015  Decreased Interest 0 0 0 0 0 0 0  Down, Depressed, Hopeless 0 0 0 0 1 0 0  PHQ - 2 Score 0 0 0 0 1 0 0  Altered sleeping 1 0 - - 1 - -  Tired, decreased energy 2 0 - - 0 - -  Change in appetite 0 0 - - 0 - -  Feeling bad or failure about yourself  0 0 - - 0 - -  Trouble concentrating 0 0 - - 0 - -  Moving slowly or fidgety/restless 0 0 - - 0 - -  Suicidal thoughts 0 0 - - 0 - -  PHQ-9 Score 3 0 - - 2 - -  Difficult doing work/chores Not difficult at all Not difficult at all - - Somewhat difficult - -   Immunizations: (TDAP, Hep C screen, Pneumovax, Influenza, zoster)  Health Maintenance  Topic  Date Due   COVID-19 Vaccine (1) Never done   Flu Shot  Never done   Pap Smear  09/27/2022   Tetanus Vaccine  06/20/2030   Hepatitis C Screening: USPSTF Recommendation to screen - Ages 18-79 yo.  Completed   HIV Screening  Completed   Pneumococcal Vaccination  Aged Out   HPV Vaccine  Aged Out   Fall Risk: Fall Risk  10/16/2021 12/05/2020 10/03/2018 12/01/2016 06/21/2015  Falls in the past year? 0 0 No No No  Number falls in past yr: 0 0 - - -  Injury with Fall? 0 0 - - -  Risk for fall due to : No Fall Risks No Fall Risks - - -  Follow up Falls evaluation completed - - - -   Medications and allergies reviewed with patient and updated if appropriate.  Patient Active Problem List   Diagnosis Date Noted   Hx of colonic polyp 10/16/2021   GERD (gastroesophageal reflux disease) 10/16/2021   History of retinal detachment 10/16/2021   Nocturnal leg cramps 06/20/2020   Endometriosis 09/28/2017   Screen for STD (sexually transmitted disease) 08/14/2016   Visit for preventive health examination 06/21/2015   Rhinitis, allergic 06/21/2015   Migraines 02/08/2014    Current Outpatient Medications on File Prior to Visit  Medication Sig Dispense Refill   albuterol (VENTOLIN HFA) 108 (90 Base) MCG/ACT inhaler Inhale 2 puffs  into the lungs every 6 (six) hours as needed for wheezing or shortness of breath. 18 g 1   famotidine (PEPCID) 40 MG tablet Take 40 mg by mouth at bedtime.     fexofenadine-pseudoephedrine (ALLEGRA-D 24) 180-240 MG 24 hr tablet Take 1 tablet by mouth daily.     fluticasone (FLONASE) 50 MCG/ACT nasal spray Place 2 sprays into both nostrils daily. 16 g 0   mometasone (ASMANEX) 220 MCG/INH inhaler INHALE 2 PUFFS BY MOUTH INTO THE LUNGS DAILY 1 each 3   Multiple Vitamins-Minerals (WOMENS MULTIVITAMIN PLUS) TABS Take by mouth daily.     RABEprazole (ACIPHEX) 20 MG tablet Take 2 tablets (40 mg total) by mouth 2 (two) times daily. 120 tablet 2   albuterol (PROVENTIL) (5 MG/ML) 0.5%  nebulizer solution Take 0.5 mLs (2.5 mg total) by nebulization every 6 (six) hours as needed for wheezing or shortness of breath. 60 mL 0   No current facility-administered medications on file prior to visit.    Past Medical History:  Diagnosis Date   Anemia    Asthma    prn inhaler   History of gestational hypertension    Irregular menstrual cycle    Menorrhagia    Migraines    Seasonal allergies    Sickle cell trait [facility])     Past Surgical History:  Procedure Laterality Date   CARPAL TUNNEL RELEASE Right 03/25/2015   Procedure:  ENDOSCOPIC CARPAL TUNNEL RELEASE;  Surgeon: Milly Jakob, MD;  Location: [place] [place];  Service: Orthopedics;  Laterality: Right;   CESAREAN SECTION  01/02/2009   DORSAL COMPARTMENT RELEASE Right 03/25/2015   Procedure: RIGHT WRIST DEQUERVAIN RELEASE AND ;  Surgeon: Milly Jakob, MD;  Location: [place];  Service: Orthopedics;  Laterality: Right;   HYSTEROSCOPY WITH D & C N/A 06/24/2017   Procedure: DILATATION AND CURETTAGE /HYSTEROSCOPY  pelvic biopsy and ablation of endometriosis;  Surgeon: Thurnell Lose, MD;  Location[facility]  Service: Gynecology;  Laterality: N/A;   LAPAROSCOPY N/A 06/24/2017   Procedure: LAPAROSCOPY DIAGNOSTIC;  Surgeon: Thurnell Lose, MD;  Location: [facility];  Service: Gynecology;  Laterality: N/A;   WISDOM TOOTH EXTRACTION      Social History   Socioeconomic History   Marital status: Single    Spouse name: Not on file   Number of children: 1   Years of education: 12   Highest education level: Not on file  Occupational History   Occupation: Scientist, clinical (histocompatibility and immunogenetics): [employer]  Tobacco Use   Smoking status: Never   Smokeless tobacco: Never  Vaping Use   Vaping Use: Never used  Substance and Sexual Activity   Alcohol use: Yes    Alcohol/week: 3.0 standard drinks    Types: 3 Glasses of wine per week   Drug use: No   Sexual activity: Yes    Birth  control/protection: None  Other Topics Concern   Not on file  Social History Narrative   Lives with son (2010), employeed at [employer] part-time. Completed some college classes.    Social Determinants of Health   Financial Resource Strain: Not on file  Food Insecurity: Not on file  Transportation Needs: Not on file  Physical Activity: Not on file  Stress: Not on file  Social Connections: Not on file    Family History  Problem Relation Age of Onset   Hypertension Mother    Asthma Father    Hypertension Father    Asthma Brother  Asthma Paternal Aunt    Diabetes Paternal Aunt    Cancer Maternal Grandfather        oral cancer   Diabetes Maternal Grandfather    Hypertension Maternal Grandfather    Colon cancer Neg Hx    Esophageal cancer Neg Hx    Rectal cancer Neg Hx    Stomach cancer Neg Hx         Review of Systems  Constitutional:  Negative for fever, malaise/fatigue and weight loss.  HENT:  Negative for congestion and sore throat.   Eyes:        Negative for visual changes  Respiratory:  Negative for cough and shortness of breath.   Cardiovascular:  Negative for chest pain, palpitations and leg swelling.  Gastrointestinal:  Negative for blood in stool, constipation, diarrhea and heartburn.  Genitourinary:  Negative for dysuria, frequency and urgency.  Musculoskeletal:  Negative for falls, joint pain and myalgias.  Skin:  Negative for rash.  Neurological:  Negative for dizziness, sensory change and headaches.  Endo/Heme/Allergies:  Does not bruise/bleed easily.  Psychiatric/Behavioral:  Negative for depression, substance abuse and suicidal ideas. The patient is not nervous/anxious.    Objective:   Vitals:   10/16/21 0927  BP: 118/64  Pulse: 74  Temp: 98.4 F (36.9 C)   Body mass index is 34.53 kg/m.  Physical Examination:  Physical Exam Vitals reviewed.  Constitutional:      General: She is not in acute distress.    Appearance: She is well-developed.   HENT:     Right Ear: Tympanic membrane, ear canal and external ear normal.     Left Ear: Tympanic membrane, ear canal and external ear normal.  Eyes:     Extraocular Movements: Extraocular movements intact.     Conjunctiva/sclera: Conjunctivae normal.  Cardiovascular:     Rate and Rhythm: Normal rate and regular rhythm.     Pulses: Normal pulses.     Heart sounds: Normal heart sounds.  Pulmonary:     Effort: Pulmonary effort is normal. No respiratory distress.     Breath sounds: Normal breath sounds.  Chest:     Chest wall: No tenderness.  Abdominal:     General: Bowel sounds are normal.     Palpations: Abdomen is soft.  Genitourinary:    Comments: Deferred breast and pelvic exam to GYN Musculoskeletal:        General: No tenderness. Normal range of motion.     Cervical back: Normal range of motion and neck supple.  Lymphadenopathy:     Cervical: No cervical adenopathy.  Skin:    General: Skin is warm and dry.  Neurological:     Mental Status: She is alert and oriented to person, place, and time.     Deep Tendon Reflexes: Reflexes are normal and symmetric.  Psychiatric:        Behavior: Behavior normal.        Thought Content: Thought content normal.   ASSESSMENT and PLAN: This visit occurred during the SARS-CoV-2 public health emergency.  Safety protocols were in place, including screening questions prior to the visit, additional usage of staff PPE, and extensive cleaning of exam room while observing appropriate contact time as indicated for disinfecting solutions.   [place] was seen today for establish care.  Diagnoses and all orders for this visit:  Preventative health care -     Comprehensive metabolic panel -     TSH -     CBC -  Lipid panel  Hx of colonic polyp  Encounter for lipid screening for cardiovascular disease -     Lipid panel  Chronic migraine without aura without status migrainosus, not intractable -     metoprolol tartrate (LOPRESSOR) 50  MG tablet; Take 1 tablet (50 mg total) by mouth 2 (two) times daily. -     eletriptan (RELPAX) 20 MG tablet; Take 1 tablet (20 mg total) by mouth once for 1 dose. May repeat in 2 hours if headache persists or recurs.  Gastroesophageal reflux disease without esophagitis    Sign medical release form to get your records from GYN. Increase daily exercise to 5x/week, moderate intensity. Maintain Appt with GYN.  Problem List Items Addressed This Visit       Cardiovascular and Mediastinum   Migraines    Controlled with metoprolol BID and relpax prn Has maybe 1migrane per month Refill sent      Relevant Medications   metoprolol tartrate (LOPRESSOR) 50 MG tablet   eletriptan (RELPAX) 20 MG tablet     Digestive   GERD (gastroesophageal reflux disease)    Stable with famotidine and aciphex. EGD completed 08/2021: non bleeding pyloric ulcer, negative esophagus and stomach biopsy.        Other   Hx of colonic polyp    Colonoscopy completed 08/2021: One 2 mm polyp in the rectum. Pathology report: hyperplastic polyp.      Other Visit Diagnoses     Preventative health care    -  Primary   Relevant Orders   Comprehensive metabolic panel   TSH   CBC   Lipid panel   Encounter for lipid screening for cardiovascular disease       Relevant Orders   Lipid panel       Follow up: Return in about 1 year (around 10/16/2022) for CPE (fasting).  Wilfred Lacy, NP

## 2021-10-16 NOTE — Assessment & Plan Note (Addendum)
Colonoscopy completed 08/2021: One 2 mm polyp in the rectum. Pathology report: hyperplastic polyp.

## 2021-10-16 NOTE — Assessment & Plan Note (Signed)
Stable with famotidine and aciphex. EGD completed 08/2021: non bleeding pyloric ulcer, negative esophagus and stomach biopsy.

## 2021-10-16 NOTE — Patient Instructions (Addendum)
Thank you for choosing [facility]  Go to lab for blood draw  Sign medical release form to get your records from [facility] 34-34 Years Old, Female Preventive care refers to lifestyle choices and visits with your health care provider that can promote health and wellness. This includes: A yearly physical exam. This is also called an annual wellness visit. Regular dental and eye exams. Immunizations. Screening for certain conditions. Healthy lifestyle choices, such as: Eating a healthy diet. Getting regular exercise. Not using drugs or products that contain nicotine and tobacco. Limiting alcohol use. What can I expect for my preventive care visit? Physical exam Your health care provider may check your: Height and weight. These may be used to calculate your BMI (body mass index). BMI is a measurement that tells if you are at a healthy weight. Heart rate and blood pressure. Body temperature. Skin for abnormal spots. Counseling Your health care provider may ask you questions about your: Past medical problems. Family's medical history. Alcohol, tobacco, and drug use. Emotional well-being. Home life and relationship well-being. Sexual activity. Diet, exercise, and sleep habits. Work and work Statistician. Access to firearms. Method of birth control. Menstrual cycle. Pregnancy history. What immunizations do I need? Vaccines are usually given at various ages, according to a schedule. Your health care provider will recommend vaccines for you based on your age, medical history, and lifestyle or other factors, such as travel or where you work. What tests do I need? Blood tests Lipid and cholesterol levels. These may be checked every 5 years starting at age 27. Hepatitis C test. Hepatitis B test. Screening Diabetes screening. This is done by checking your blood sugar (glucose) after you have not eaten for a while (fasting). STD (sexually transmitted disease)  testing, if you are at risk. BRCA-related cancer screening. This may be done if you have a family history of breast, ovarian, tubal, or peritoneal cancers. Pelvic exam and Pap test. This may be done every 3 years starting at age 56. Starting at age 88, this may be done every 5 years if you have a Pap test in combination with an HPV test. Talk with your health care provider about your test results, treatment options, and if necessary, the need for more tests. Follow these instructions at home: Eating and drinking  Eat a healthy diet that includes fresh fruits and vegetables, whole grains, lean protein, and low-fat dairy products. Take vitamin and mineral supplements as recommended by your health care provider. Do not drink alcohol if: Your health care provider tells you not to drink. You are pregnant, may be pregnant, or are planning to become pregnant. If you drink alcohol: Limit how much you have to 0-1 drink a day. Be aware of how much alcohol is in your drink. In the [place] one drink equals one 12 oz bottle of beer (355 mL), one 5 oz glass of wine (148 mL), or one 1 oz glass of hard liquor (44 mL). Lifestyle Take daily care of your teeth and gums. Brush your teeth every morning and night with fluoride toothpaste. Floss one time each day. Stay active. Exercise for at least 30 minutes 5 or more days each week. Do not use any products that contain nicotine or tobacco, such as cigarettes, e-cigarettes, and chewing tobacco. If you need help quitting, ask your health care provider. Do not use drugs. If you are sexually active, practice safe sex. Use a condom or other form of protection to prevent STIs (sexually transmitted  infections). If you do not wish to become pregnant, use a form of birth control. If you plan to become pregnant, see your health care provider for a prepregnancy visit. Find healthy ways to cope with stress, such as: Meditation, yoga, or listening to  music. Journaling. Talking to a trusted person. Spending time with friends and family. Safety Always wear your seat belt while driving or riding in a vehicle. Do not drive: If you have been drinking alcohol. Do not ride with someone who has been drinking. When you are tired or distracted. While texting. Wear a helmet and other protective equipment during sports activities. If you have firearms in your house, make sure you follow all gun safety procedures. Seek help if you have been physically or sexually abused. What's next? Go to your health care provider once a year for an annual wellness visit. Ask your health care provider how often you should have your eyes and teeth checked. Stay up to date on all vaccines. This information is not intended to replace advice given to you by your health care provider. Make sure you discuss any questions you have with your health care provider. Document Revised: 02/21/2021 Document Reviewed: 08/25/2018 Elsevier Patient Education  2022 [employer].

## 2021-10-16 NOTE — Assessment & Plan Note (Signed)
Controlled with metoprolol BID and relpax prn Has maybe 16migrane per month Refill sent

## 2021-10-16 NOTE — Assessment & Plan Note (Signed)
followed by [facility] in [place] She stopped progestin and Femara tabs 75months ago due to side effects (fatigue and insomnia). Has menstrual cycle monthly, bleeding x 3-4days, painful cycle for first 2days.

## 2021-10-20 ENCOUNTER — Emergency Department (HOSPITAL_BASED_OUTPATIENT_CLINIC_OR_DEPARTMENT_OTHER)
Admission: EM | Admit: 2021-10-20 | Discharge: 2021-10-20 | Disposition: A | Discharge status: Home / Self Care | Payer: BC Managed Care – PPO | Attending: Emergency Medicine | Admitting: Emergency Medicine

## 2021-10-20 ENCOUNTER — Other Ambulatory Visit: Payer: Self-pay

## 2021-10-20 ENCOUNTER — Encounter (HOSPITAL_BASED_OUTPATIENT_CLINIC_OR_DEPARTMENT_OTHER): Payer: Self-pay

## 2021-10-20 ENCOUNTER — Emergency Department (HOSPITAL_BASED_OUTPATIENT_CLINIC_OR_DEPARTMENT_OTHER): Payer: BC Managed Care – PPO

## 2021-10-20 DIAGNOSIS — R202 Paresthesia of skin: Secondary | ICD-10-CM | POA: Diagnosis not present

## 2021-10-20 DIAGNOSIS — J45909 Unspecified asthma, uncomplicated: Secondary | ICD-10-CM | POA: Diagnosis not present

## 2021-10-20 DIAGNOSIS — X501XXA Overexertion from prolonged static or awkward postures, initial encounter: Secondary | ICD-10-CM | POA: Diagnosis not present

## 2021-10-20 DIAGNOSIS — S34109A Unspecified injury to unspecified level of lumbar spinal cord, initial encounter: Secondary | ICD-10-CM | POA: Diagnosis present

## 2021-10-20 DIAGNOSIS — M545 Low back pain, unspecified: Secondary | ICD-10-CM

## 2021-10-20 DIAGNOSIS — S39012A Strain of muscle, fascia and tendon of lower back, initial encounter: Secondary | ICD-10-CM | POA: Diagnosis not present

## 2021-10-20 LAB — URINALYSIS, MICROSCOPIC (REFLEX)

## 2021-10-20 LAB — URINALYSIS, ROUTINE W REFLEX MICROSCOPIC
Bilirubin Urine: NEGATIVE
Glucose, UA: NEGATIVE mg/dL
Ketones, ur: NEGATIVE mg/dL
Leukocytes,Ua: NEGATIVE
Nitrite: NEGATIVE
Protein, ur: NEGATIVE mg/dL
Specific Gravity, Urine: 1.015 (ref 1.005–1.030)
pH: 5.5 (ref 5.0–8.0)

## 2021-10-20 LAB — PREGNANCY, URINE: Preg Test, Ur: NEGATIVE

## 2021-10-20 MED ORDER — KETOROLAC TROMETHAMINE 30 MG/ML IJ SOLN
30.0000 mg | Freq: Once | INTRAMUSCULAR | Status: AC
  Administered 2021-10-20: 30 mg via INTRAMUSCULAR
  Filled 2021-10-20: qty 1

## 2021-10-20 MED ORDER — IBUPROFEN 600 MG PO TABS: 600.0000 mg | ORAL_TABLET | Freq: Four times a day (QID) | ORAL | 0 refills | Status: DC | PRN

## 2021-10-20 MED ORDER — CYCLOBENZAPRINE HCL 10 MG PO TABS
10.0000 mg | ORAL_TABLET | Freq: Once | ORAL | Status: AC
  Administered 2021-10-20: 10 mg via ORAL
  Filled 2021-10-20: qty 1

## 2021-10-20 MED ORDER — CYCLOBENZAPRINE HCL 10 MG PO TABS: 10.0000 mg | ORAL_TABLET | Freq: Two times a day (BID) | ORAL | 0 refills | Status: DC | PRN

## 2021-10-20 NOTE — Discharge Instructions (Signed)
Recommend using the muscle relaxer and the anti-inflammatory medication as needed for pain and spasms.  If you develop worsening pain, fever, numbness, weakness, bladder or bowel incontinence or other new concerning symptom, come back to ER for reassessment.  Please follow-up with your primary care doctor.

## 2021-10-20 NOTE — ED Triage Notes (Signed)
Pt c/o lower back pain radiating down her L leg causing a tingling sensation. Pt states she has hx of endometriosis and thinks this might be a related flare up. S/S typical to her usual endometriosis pain per patient

## 2021-10-20 NOTE — ED Provider Notes (Signed)
[facility] EMERGENCY DEPARTMENT Provider Note   CSN: [id number] Arrival date & time: 10/20/21  9833     History Chief Complaint  Patient presents with   Leg Pain   Back Pain    Kimberly Guerra is a 35 y.o. female.  Presents to ER with concern for low back pain.  Patient reports pain started on Saturday after standing up after bending down.  States that pain has been intermittent, feels like a sharp and stabbing pain, cramping at times.  Radiates down left leg.  Has had prior episode and was told could be related to her endometriosis, menstrual cycle.  States that she anticipates she will start her cycle next week.  Currently does not have any vaginal bleeding, discharge, pelvic pain or dysuria.  No fevers or chills.  Reports that when she has severe pain she also has tingling in her toes.  Denies weakness.  No bladder or bowel incontinence.  HPI     Past Medical History:  Diagnosis Date   Anemia    Asthma    prn inhaler   History of gestational hypertension    Irregular menstrual cycle    Menorrhagia    Migraines    Seasonal allergies    Sickle cell trait [facility])     Patient Active Problem List   Diagnosis Date Noted   Hx of colonic polyp 10/16/2021   GERD (gastroesophageal reflux disease) 10/16/2021   History of retinal detachment 10/16/2021   Nocturnal leg cramps 06/20/2020   Endometriosis 09/28/2017   Screen for STD (sexually transmitted disease) 08/14/2016   Visit for preventive health examination 06/21/2015   Rhinitis, allergic 06/21/2015   Migraines 02/08/2014    Past Surgical History:  Procedure Laterality Date   CARPAL TUNNEL RELEASE Right 03/25/2015   Procedure:  ENDOSCOPIC CARPAL TUNNEL RELEASE;  Surgeon: Milly Jakob, MD;  Location: [facility];  Service: Orthopedics;  Laterality: Right;   CESAREAN SECTION  01/02/2009   DORSAL COMPARTMENT RELEASE Right 03/25/2015   Procedure: RIGHT WRIST DEQUERVAIN RELEASE AND ;  Surgeon: Milly Jakob, MD;  Location: [facility];  Service: Orthopedics;  Laterality: Right;   HYSTEROSCOPY WITH D & C N/A 06/24/2017   Procedure: DILATATION AND CURETTAGE /HYSTEROSCOPY  pelvic biopsy and ablation of endometriosis;  Surgeon: Thurnell Lose, MD;  Location: [facility];  Service: Gynecology;  Laterality: N/A;   LAPAROSCOPY N/A 06/24/2017   Procedure: LAPAROSCOPY DIAGNOSTIC;  Surgeon: Thurnell Lose, MD;  Location: [facility];  Service: Gynecology;  Laterality: N/A;   WISDOM TOOTH EXTRACTION       OB History     Gravida  2   Para  1   Term  1   Preterm      AB  1   Living  1      SAB      IAB  1   Ectopic      Multiple      Live Births  1           Family History  Problem Relation Age of Onset   Hypertension Mother    Asthma Father    Hypertension Father    Asthma Brother    Asthma Paternal Aunt    Diabetes Paternal Aunt    Cancer Maternal Grandfather        oral cancer   Diabetes Maternal Grandfather    Hypertension Maternal Grandfather    Colon cancer Neg Hx  Esophageal cancer Neg Hx    Rectal cancer Neg Hx    Stomach cancer Neg Hx     Social History   Tobacco Use   Smoking status: Never   Smokeless tobacco: Never  Vaping Use   Vaping Use: Never used  Substance Use Topics   Alcohol use: Yes    Alcohol/week: 3.0 standard drinks    Types: 3 Glasses of wine per week   Drug use: No    Home Medications Prior to Admission medications   Medication Sig Start Date End Date Taking? Authorizing Provider  cyclobenzaprine (FLEXERIL) 10 MG tablet Take 1 tablet (10 mg total) by mouth 2 (two) times daily as needed for muscle spasms. 10/20/21  Yes Lucrezia Starch, MD  ibuprofen (ADVIL) 600 MG tablet Take 1 tablet (600 mg total) by mouth every 6 (six) hours as needed. 10/20/21  Yes Lucrezia Starch, MD  metoprolol tartrate (LOPRESSOR) 50 MG tablet Take 1 tablet (50 mg total) by mouth 2 (two) times daily.  10/16/21  Yes Nche, Charlene Brooke, NP  RABEprazole (ACIPHEX) 20 MG tablet Take 2 tablets (40 mg total) by mouth 2 (two) times daily. 04/15/21  Yes Thornton Park, MD  albuterol (PROVENTIL) (5 MG/ML) 0.5% nebulizer solution Take 0.5 mLs (2.5 mg total) by nebulization every 6 (six) hours as needed for wheezing or shortness of breath. 11/06/19 07/09/20  Alveria Apley, PA-C  albuterol (VENTOLIN HFA) 108 (90 Base) MCG/ACT inhaler Inhale 2 puffs into the lungs every 6 (six) hours as needed for wheezing or shortness of breath. 09/05/19   Brunetta Jeans, PA-C  eletriptan (RELPAX) 20 MG tablet Take 1 tablet (20 mg total) by mouth once for 1 dose. May repeat in 2 hours if headache persists or recurs. 10/16/21 10/16/21  Nche, Charlene Brooke, NP  famotidine (PEPCID) 40 MG tablet Take 40 mg by mouth at bedtime.    [provider]  fluticasone (FLONASE) 50 MCG/ACT nasal spray Place 2 sprays into both nostrils daily. 01/09/19   Brunetta Jeans, PA-C  mometasone [facility]) 220 MCG/INH inhaler INHALE 2 PUFFS BY MOUTH INTO THE LUNGS DAILY 01/15/21   Brunetta Jeans, PA-C  Multiple Vitamins-Minerals (WOMENS MULTIVITAMIN PLUS) TABS Take by mouth daily.    [provider]    Allergies    Topamax [topiramate]  Review of Systems   Review of Systems  Constitutional:  Negative for chills and fever.  HENT:  Negative for ear pain and sore throat.   Eyes:  Negative for pain and visual disturbance.  Respiratory:  Negative for cough and shortness of breath.   Cardiovascular:  Negative for chest pain and palpitations.  Gastrointestinal:  Negative for abdominal pain and vomiting.  Genitourinary:  Negative for dysuria and hematuria.  Musculoskeletal:  Positive for arthralgias and back pain.  Skin:  Negative for color change and rash.  Neurological:  Negative for seizures and syncope.  All other systems reviewed and are negative.  Physical Exam Updated Vital Signs BP 140/88   Pulse 79   Temp 99 F  (37.2 C)   Resp 14   Ht 4\' 9"  (1.448 m)   Wt 73.5 kg   LMP  (Within Weeks) Comment: Negative u-preg  SpO2 98%   BMI 35.06 kg/m   Physical Exam Vitals and nursing note reviewed.  Constitutional:      General: She is not in acute distress.    Appearance: She is well-developed.  HENT:     Head: Normocephalic and atraumatic.  Eyes:     Conjunctiva/sclera: Conjunctivae normal.  Cardiovascular:     Rate and Rhythm: Normal rate and regular rhythm.     Heart sounds: No murmur heard. Pulmonary:     Effort: Pulmonary effort is normal. No respiratory distress.     Breath sounds: Normal breath sounds.  Abdominal:     Palpations: Abdomen is soft.     Tenderness: There is no abdominal tenderness.  Musculoskeletal:     Cervical back: Neck supple.     Comments: Some tenderness across lumbar region, no step-off or deformity noted; no tenderness to palpation throughout left leg, normal DP/PT pulses bilaterally  Skin:    General: Skin is warm and dry.  Neurological:     Mental Status: She is alert.     Comments: Normal strength and sensation in bilateral lower extremities    ED Results / Procedures / Treatments   Labs (all labs ordered are listed, but only abnormal results are displayed) Labs Reviewed  URINALYSIS, ROUTINE W REFLEX MICROSCOPIC - Abnormal; Notable for the following components:      Result Value   Hgb urine dipstick TRACE (*)    All other components within normal limits  URINALYSIS, MICROSCOPIC (REFLEX) - Abnormal; Notable for the following components:   Bacteria, UA MANY (*)    All other components within normal limits  PREGNANCY, URINE    EKG None  Radiology DG Lumbar Spine Complete  Result Date: 10/20/2021 CLINICAL DATA:  Mid to lower LEFT back pain radiating down LEFT leg with tingling sensation, low back strain, history endometriosis EXAM: LUMBAR SPINE - COMPLETE 4+ VIEW COMPARISON:  02/07/2015 FINDINGS: Five non-rib bearing lumbar vertebrae. Osseous  mineralization normal. Vertebral body and disc space heights maintained. No fracture, subluxation, bone destruction or spondylolysis. SI joints preserved. IMPRESSION: No acute abnormalities. Electronically Signed   By: Lavonia Dana M.D.   On: 10/20/2021 10:04    Procedures Procedures   Medications Ordered in ED Medications  ketorolac (TORADOL) 30 MG/ML injection 30 mg (30 mg Intramuscular Given 10/20/21 0818)  cyclobenzaprine (FLEXERIL) tablet 10 mg (10 mg Oral Given 10/20/21 0818)    ED Course  I have reviewed the triage vital signs and the nursing notes.  Pertinent labs & imaging results that were available during my care of the patient were reviewed by me and considered in my medical decision making (see chart for details).    MDM Rules/Calculators/A&P                           35 year old lady presents to ER with concern for low back pain rating down left leg.  On exam she appears well in no distress.  Plain films negative.  UA negative for definite infection.  Did not have any dysuria or suprapubic pain or fevers.  Suspect MSK in nature most likely.  Symptoms improved after NSAIDs and muscle relaxer.  Recommend follow-up with primary doctor.  Reviewed return precautions at discharge.    After the discussed management above, the patient was determined to be safe for discharge.  The patient was in agreement with this plan and all questions regarding their care were answered.  ED return precautions were discussed and the patient will return to the ED with any significant worsening of condition.  Final Clinical Impression(s) / ED Diagnoses Final diagnoses:  Strain of lumbar region, initial encounter  Left low back pain, unspecified chronicity, unspecified whether sciatica present    Rx / DC  Orders ED Discharge Orders          Ordered    ibuprofen (ADVIL) 600 MG tablet  Every 6 hours PRN        10/20/21 1014    cyclobenzaprine (FLEXERIL) 10 MG tablet  2 times daily PRN         10/20/21 1014             Lucrezia Starch, MD 10/21/21 [phone]

## 2021-12-31 ENCOUNTER — Telehealth: Payer: Self-pay | Admitting: Nurse Practitioner

## 2021-12-31 NOTE — Telephone Encounter (Signed)
LVM for patient to call back regarding need for inhaler.

## 2022-02-02 ENCOUNTER — Emergency Department (HOSPITAL_BASED_OUTPATIENT_CLINIC_OR_DEPARTMENT_OTHER)
Admission: EM | Admit: 2022-02-02 | Discharge: 2022-02-02 | Disposition: A | Discharge status: Home / Self Care | Payer: BC Managed Care – PPO | Attending: Emergency Medicine | Admitting: Emergency Medicine

## 2022-02-02 ENCOUNTER — Encounter (HOSPITAL_BASED_OUTPATIENT_CLINIC_OR_DEPARTMENT_OTHER): Payer: Self-pay | Admitting: *Deleted

## 2022-02-02 ENCOUNTER — Emergency Department (HOSPITAL_BASED_OUTPATIENT_CLINIC_OR_DEPARTMENT_OTHER): Payer: BC Managed Care – PPO

## 2022-02-02 ENCOUNTER — Other Ambulatory Visit: Payer: Self-pay

## 2022-02-02 DIAGNOSIS — R42 Dizziness and giddiness: Secondary | ICD-10-CM

## 2022-02-02 DIAGNOSIS — Z79899 Other long term (current) drug therapy: Secondary | ICD-10-CM | POA: Insufficient documentation

## 2022-02-02 DIAGNOSIS — I10 Essential (primary) hypertension: Secondary | ICD-10-CM

## 2022-02-02 LAB — URINALYSIS, ROUTINE W REFLEX MICROSCOPIC
Bilirubin Urine: NEGATIVE
Glucose, UA: NEGATIVE mg/dL
Ketones, ur: NEGATIVE mg/dL
Leukocytes,Ua: NEGATIVE
Nitrite: NEGATIVE
Protein, ur: NEGATIVE mg/dL
Specific Gravity, Urine: 1.02 (ref 1.005–1.030)
pH: 5.5 (ref 5.0–8.0)

## 2022-02-02 LAB — CBC WITH DIFFERENTIAL/PLATELET
Abs Immature Granulocytes: 0.06 10*3/uL (ref 0.00–0.07)
Basophils Absolute: 0 10*3/uL (ref 0.0–0.1)
Basophils Relative: 0 %
Eosinophils Absolute: 0.2 10*3/uL (ref 0.0–0.5)
Eosinophils Relative: 2 %
HCT: 33.6 % — ABNORMAL LOW (ref 36.0–46.0)
Hemoglobin: 10.8 g/dL — ABNORMAL LOW (ref 12.0–15.0)
Immature Granulocytes: 1 %
Lymphocytes Relative: 24 %
Lymphs Abs: 2.8 10*3/uL (ref 0.7–4.0)
MCH: 22.6 pg — ABNORMAL LOW (ref 26.0–34.0)
MCHC: 32.1 g/dL (ref 30.0–36.0)
MCV: 70.4 fL — ABNORMAL LOW (ref 80.0–100.0)
Monocytes Absolute: 0.6 10*3/uL (ref 0.1–1.0)
Monocytes Relative: 5 %
Neutro Abs: 8.3 10*3/uL — ABNORMAL HIGH (ref 1.7–7.7)
Neutrophils Relative %: 68 %
Platelets: 554 10*3/uL — ABNORMAL HIGH (ref 150–400)
RBC: 4.77 MIL/uL (ref 3.87–5.11)
RDW: 17.3 % — ABNORMAL HIGH (ref 11.5–15.5)
WBC: 12 10*3/uL — ABNORMAL HIGH (ref 4.0–10.5)
nRBC: 0 % (ref 0.0–0.2)

## 2022-02-02 LAB — COMPREHENSIVE METABOLIC PANEL
ALT: 14 U/L (ref 0–44)
AST: 19 U/L (ref 15–41)
Albumin: 3.9 g/dL (ref 3.5–5.0)
Alkaline Phosphatase: 86 U/L (ref 38–126)
Anion gap: 10 (ref 5–15)
BUN: 11 mg/dL (ref 6–20)
CO2: 23 mmol/L (ref 22–32)
Calcium: 8.9 mg/dL (ref 8.9–10.3)
Chloride: 101 mmol/L (ref 98–111)
Creatinine, Ser: 0.79 mg/dL (ref 0.44–1.00)
GFR, Estimated: >60 mL/min (ref >60)
Glucose, Bld: 123 mg/dL — ABNORMAL HIGH (ref 70–99)
Potassium: 3.8 mmol/L (ref 3.5–5.1)
Sodium: 134 mmol/L — ABNORMAL LOW (ref 135–145)
Total Bilirubin: 0.4 mg/dL (ref 0.3–1.2)
Total Protein: 8.2 g/dL — ABNORMAL HIGH (ref 6.5–8.1)

## 2022-02-02 LAB — URINALYSIS, MICROSCOPIC (REFLEX)

## 2022-02-02 LAB — PREGNANCY, URINE: Preg Test, Ur: NEGATIVE

## 2022-02-02 MED ORDER — LOSARTAN POTASSIUM 25 MG PO TABS
25.0000 mg | ORAL_TABLET | Freq: Once | ORAL | Status: AC
  Administered 2022-02-02: 25 mg via ORAL
  Filled 2022-02-02: qty 1

## 2022-02-02 MED ORDER — LOSARTAN POTASSIUM 25 MG PO TABS: 25.0000 mg | ORAL_TABLET | Freq: Every day | ORAL | 0 refills | Status: DC

## 2022-02-02 NOTE — Discharge Instructions (Signed)
Call your primary care doctor or specialist as discussed in the next 2-3 days.   Return immediately back to the ER if:  Your symptoms worsen within the next 12-24 hours. You develop new symptoms such as new fevers, persistent vomiting, new pain, shortness of breath, or new weakness or numbness, or if you have any other concerns.  

## 2022-02-02 NOTE — ED Notes (Signed)
Pt returned from CT scan via wheelchair by rad tech.

## 2022-02-02 NOTE — ED Provider Notes (Signed)
[facility] EMERGENCY DEPARTMENT Provider Note   CSN: [id number] Arrival date & time: 02/02/22  1851     History  Chief Complaint  Patient presents with   Hypertension    Kimberly Guerra is a 36 y.o. female.  Patient presents with chief complaint of lightheadedness dizziness had blurred vision.  She checked her blood pressure and stated it was high 149/110, presents ER for evaluation.  She states she takes metoprolol 50 mg twice daily for migraine prophylaxis now for blood pressure issues.  Otherwise denies any headache at this time no fevers no cough no chest pain no abdominal pain.      Home Medications Prior to Admission medications   Medication Sig Start Date End Date Taking? Authorizing Provider  losartan (COZAAR) 25 MG tablet Take 1 tablet (25 mg total) by mouth daily. 02/02/22 03/04/22 Yes Luna Fuse, MD  albuterol (PROVENTIL) (5 MG/ML) 0.5% nebulizer solution Take 0.5 mLs (2.5 mg total) by nebulization every 6 (six) hours as needed for wheezing or shortness of breath. 11/06/19 07/09/20  Alveria Apley, PA-C  albuterol (VENTOLIN HFA) 108 (90 Base) MCG/ACT inhaler Inhale 2 puffs into the lungs every 6 (six) hours as needed for wheezing or shortness of breath. 09/05/19   Brunetta Jeans, PA-C  cyclobenzaprine (FLEXERIL) 10 MG tablet Take 1 tablet (10 mg total) by mouth 2 (two) times daily as needed for muscle spasms. 10/20/21   Lucrezia Starch, MD  eletriptan (RELPAX) 20 MG tablet Take 1 tablet (20 mg total) by mouth once for 1 dose. May repeat in 2 hours if headache persists or recurs. 10/16/21 10/16/21  Nche, Charlene Brooke, NP  famotidine (PEPCID) 40 MG tablet Take 40 mg by mouth at bedtime.    [provider]  fluticasone (FLONASE) 50 MCG/ACT nasal spray Place 2 sprays into both nostrils daily. 01/09/19   Brunetta Jeans, PA-C  ibuprofen (ADVIL) 600 MG tablet Take 1 tablet (600 mg total) by mouth every 6 (six) hours as needed. 10/20/21   Lucrezia Starch, MD  metoprolol tartrate (LOPRESSOR) 50 MG tablet Take 1 tablet (50 mg total) by mouth 2 (two) times daily. 10/16/21   Flossie Buffy, NP  mometasone [facility]) 220 MCG/INH inhaler INHALE 2 PUFFS BY MOUTH INTO THE LUNGS DAILY 01/15/21   Brunetta Jeans, PA-C  Multiple Vitamins-Minerals (WOMENS MULTIVITAMIN PLUS) TABS Take by mouth daily.    [provider]  RABEprazole (ACIPHEX) 20 MG tablet Take 2 tablets (40 mg total) by mouth 2 (two) times daily. 04/15/21   Thornton Park, MD      Allergies    Topamax [topiramate]    Review of Systems   Review of Systems  Constitutional:  Negative for fever.  HENT:  Negative for ear pain.   Eyes:  Negative for pain.  Respiratory:  Negative for cough.   Cardiovascular:  Negative for chest pain.  Gastrointestinal:  Negative for abdominal pain.  Genitourinary:  Negative for flank pain.  Musculoskeletal:  Negative for back pain.  Skin:  Negative for rash.  Neurological:  Negative for headaches.   Physical Exam Updated Vital Signs BP 132/85    Pulse 71    Temp 98.4 F (36.9 C) (Oral)    Resp 18    Ht 4\' 9"  (1.448 m)    Wt 73.5 kg    LMP 01/20/2022    SpO2 99%    BMI 35.06 kg/m  Physical Exam Constitutional:      General: She  is not in acute distress.    Appearance: Normal appearance.  HENT:     Head: Normocephalic.     Nose: Nose normal.  Eyes:     Extraocular Movements: Extraocular movements intact.  Cardiovascular:     Rate and Rhythm: Normal rate.  Pulmonary:     Effort: Pulmonary effort is normal.  Musculoskeletal:        General: Normal range of motion.     Cervical back: Normal range of motion.  Neurological:     General: No focal deficit present.     Mental Status: She is alert and oriented to person, place, and time. Mental status is at baseline.     Cranial Nerves: No cranial nerve deficit.     Motor: No weakness.     Gait: Gait normal.    ED Results / Procedures / Treatments   Labs (all labs ordered are  listed, but only abnormal results are displayed) Labs Reviewed  URINALYSIS, ROUTINE W REFLEX MICROSCOPIC - Abnormal; Notable for the following components:      Result Value   APPearance HAZY (*)    Hgb urine dipstick TRACE (*)    All other components within normal limits  CBC WITH DIFFERENTIAL/PLATELET - Abnormal; Notable for the following components:   WBC 12.0 (*)    Hemoglobin 10.8 (*)    HCT 33.6 (*)    MCV 70.4 (*)    MCH 22.6 (*)    RDW 17.3 (*)    Platelets 554 (*)    Neutro Abs 8.3 (*)    All other components within normal limits  COMPREHENSIVE METABOLIC PANEL - Abnormal; Notable for the following components:   Sodium 134 (*)    Glucose, Bld 123 (*)    Total Protein 8.2 (*)    All other components within normal limits  URINALYSIS, MICROSCOPIC (REFLEX) - Abnormal; Notable for the following components:   Bacteria, UA FEW (*)    All other components within normal limits  PREGNANCY, URINE    EKG EKG Interpretation  Date/Time:  Monday February 02 2022 19:46:53 EST Ventricular Rate:  82 PR Interval:  170 QRS Duration: 80 QT Interval:  352 QTC Calculation: 411 R Axis:   58 Text Interpretation: Normal sinus rhythm Normal ECG No previous ECGs available Confirmed by Thamas Jaegers ([id number]) on 02/02/2022 8:42:12 PM  Radiology CT Head Wo Contrast  Result Date: 02/02/2022 CLINICAL DATA:  Headache, new or worsening, neuro deficit (Age 22-49y) HA, blurred vision, HTN EXAM: CT HEAD WITHOUT CONTRAST TECHNIQUE: Contiguous axial images were obtained from the base of the skull through the vertex without intravenous contrast. RADIATION DOSE REDUCTION: This exam was performed according to the departmental dose-optimization program which includes automated exposure control, adjustment of the mA and/or kV according to patient size and/or use of iterative reconstruction technique. COMPARISON:  None. FINDINGS: Brain: Normal anatomic configuration. No abnormal intra or extra-axial mass lesion or  fluid collection. No abnormal mass effect or midline shift. No evidence of acute intracranial hemorrhage or infarct. Ventricular size is normal. Cerebellum unremarkable. Vascular: Unremarkable Skull: Intact Sinuses/Orbits: Paranasal sinuses are clear. Orbits are unremarkable. Other: Mastoid air cells and middle ear cavities are clear. IMPRESSION: No acute intracranial abnormality. Electronically Signed   By: Fidela Salisbury M.D.   On: 02/02/2022 21:16    Procedures Procedures    Medications Ordered in ED Medications  losartan (COZAAR) tablet 25 mg (25 mg Oral Given 02/02/22 2129)    ED Course/ Medical Decision Making/ A&P  Medical Decision Making Amount and/or Complexity of Data Reviewed Labs: ordered.  Risk Prescription drug management.   Chart review shows office visit with the primary care doctor October 16, 2021 for preventive care.  Work-up here included labs which were unremarkable chemistry normal troponin negative.  CT imaging of the brain unremarkable as well.  Patient blood pressure was elevated here given losartan with significant improvement of blood pressure.  Given a prescription of losartan to go home with.  Advised outpatient follow-up with her primary care doctor within this week.  Advise immediate return for worsening symptoms pain or any additional concerns return immediately to the ER.        Final Clinical Impression(s) / ED Diagnoses Final diagnoses:  Hypertension, unspecified type  Dizziness    Rx / DC Orders ED Discharge Orders          Ordered    losartan (COZAAR) 25 MG tablet  Daily        02/02/22 2240              Luna Fuse, MD 02/02/22 2240

## 2022-02-02 NOTE — ED Triage Notes (Signed)
She has been taking her BP at home and it has been elevated. She does not have a hx of HTN. Blurred vision, shakes and "rocking".

## 2022-02-04 ENCOUNTER — Encounter: Payer: Self-pay | Admitting: Nurse Practitioner

## 2022-02-04 ENCOUNTER — Ambulatory Visit: Payer: BC Managed Care – PPO | Admitting: Nurse Practitioner

## 2022-02-04 ENCOUNTER — Other Ambulatory Visit: Payer: Self-pay

## 2022-02-04 VITALS — BP 110/60 | HR 90 | Temp 97.2°F | Ht <= 58 in | Wt 164.6 lb

## 2022-02-04 DIAGNOSIS — R42 Dizziness and giddiness: Secondary | ICD-10-CM | POA: Diagnosis not present

## 2022-02-04 DIAGNOSIS — D500 Iron deficiency anemia secondary to blood loss (chronic): Secondary | ICD-10-CM | POA: Insufficient documentation

## 2022-02-04 DIAGNOSIS — I10 Essential (primary) hypertension: Secondary | ICD-10-CM

## 2022-02-04 MED ORDER — LOSARTAN POTASSIUM 25 MG PO TABS: 25.0000 mg | ORAL_TABLET | Freq: Every day | ORAL | 1 refills | Status: DC

## 2022-02-04 MED ORDER — FERROUS FUMARATE 324 (106 FE) MG PO TABS: 1.0000 | ORAL_TABLET | Freq: Every day | ORAL | 0 refills | Status: DC

## 2022-02-04 NOTE — Patient Instructions (Addendum)
Start iron supplement. Continue multivitamin Change positions slowly Worknote provided for Thursday and Friday. Schedule lab appt to repeat cbc and iron panel in 76month Maintain metoprolol and losartan dose  Maintain adequate oral hydration and iron rich diet  Iron-Rich Diet Iron is a mineral that helps your body produce hemoglobin. Hemoglobin is a protein in red blood cells that carries oxygen to your body's tissues. Eating too little iron may cause you to feel weak and tired, and it can increase your risk of infection. Iron is naturally found in many foods, and many foods have iron added to them (are iron-fortified). You may need to follow an iron-rich diet if you do not have enough iron in your body due to certain medical conditions. The amount of iron that you need each day depends on your age, your sex, and any medical conditions you have. Follow instructions from your health care provider or a dietitian about how much iron you should eat each day. What are tips for following this plan? Reading food labels Check food labels to see how many milligrams (mg) of iron are in each serving. Cooking Cook foods in pots and pans that are made from iron. Take these steps to make it easier for your body to absorb iron from certain foods: Soak beans overnight before cooking. Soak whole grains overnight and drain them before using. Ferment flours before baking, such as by using yeast in bread dough. Meal planning When you eat foods that contain iron, you should eat them with foods that are high in vitamin C. These include oranges, peppers, tomatoes, potatoes, and mangoes. Vitamin C helps your body absorb iron. Certain foods and drinks prevent your body from absorbing iron properly. Avoid eating these foods in the same meal as iron-rich foods or with iron supplements. These foods include: Coffee, black tea, and red wine. Milk, dairy products, and foods that are high in calcium. Beans and  soybeans. Whole grains. General information Take iron supplements only as told by your health care provider. An overdose of iron can be life-threatening. If you were prescribed iron supplements, take them with orange juice or a vitamin C supplement. When you eat iron-fortified foods or take an iron supplement, you should also eat foods that naturally contain iron, such as meat, poultry, and fish. Eating naturally iron-rich foods helps your body absorb the iron that is added to other foods or contained in a supplement. Iron from animal sources is better absorbed than iron from plant sources. What foods should I eat? Fruits Prunes. Raisins. Eat fruits high in vitamin C, such as oranges, grapefruits, and strawberries, with iron-rich foods. Vegetables Spinach (cooked). Green peas. Broccoli. Fermented vegetables. Eat vegetables high in vitamin C, such as leafy greens, potatoes, bell peppers, and tomatoes, with iron-rich foods. Grains Iron-fortified breakfast cereal. Iron-fortified whole-wheat bread. Enriched rice. Sprouted grains. Meats and other proteins Beef liver. Beef. [place]. Chicken. Oysters. Shrimp. [place]. Sardines. Chickpeas. Nuts. Tofu. Pumpkin seeds. Beverages Tomato juice. Fresh orange juice. Prune juice. Hibiscus tea. Iron-fortified instant breakfast shakes. Sweets and desserts Blackstrap molasses. Seasonings and condiments Tahini. Fermented soy sauce. Other foods Wheat germ. The items listed above may not be a complete list of recommended foods and beverages. Contact a dietitian for more information. What foods should I limit? These are foods that should be limited while eating iron-rich foods as they can reduce the absorption of iron in your body. Grains Whole grains. Bran cereal. Bran flour. Meats and other proteins Soybeans. Products made from soy protein.  Black beans. Lentils. Mung beans. Split peas. Dairy Milk. Cream. Cheese. Yogurt. Cottage cheese. Beverages Coffee.  Black tea. Red wine. Sweets and desserts Cocoa. Chocolate. Ice cream. Seasonings and condiments Basil. Oregano. Large amounts of parsley. The items listed above may not be a complete list of foods and beverages you should limit. Contact a dietitian for more information. Summary Iron is a mineral that helps your body produce hemoglobin. Hemoglobin is a protein in red blood cells that carries oxygen to your body's tissues. Iron is naturally found in many foods, and many foods have iron added to them (are iron-fortified). When you eat foods that contain iron, you should eat them with foods that are high in vitamin C. Vitamin C helps your body absorb iron. Certain foods and drinks prevent your body from absorbing iron properly, such as whole grains and dairy products. You should avoid eating these foods in the same meal as iron-rich foods or with iron supplements. This information is not intended to replace advice given to you by your health care provider. Make sure you discuss any questions you have with your health care provider. Document Revised: 11/25/2020 Document Reviewed: 11/25/2020 Elsevier Patient Education  2022 [employer].

## 2022-02-04 NOTE — Assessment & Plan Note (Signed)
Secondary to menorrhagia. This could be contributing to current symptoms (dizziness).  Start oral iron suplement, maintain adequate oral hydration, change position slowly, repeat cbc and iron panel in 6month.

## 2022-02-04 NOTE — Progress Notes (Signed)
Subjective:  Patient ID: Kimberly Guerra, female    DOB: 09-24-1986  Age: 36 y.o. MRN: [id number]  CC: Acute Visit (Pt went to ED on Monday due to elevated BP and dizziness. /Pt states she has still been feeling slightly dizzy and off balanced at times that is why she would like to follow up/)  Dizziness This is a new problem. The current episode started in the past 7 days. The problem occurs intermittently. The problem has been unchanged. Associated symptoms include fatigue and vertigo. Pertinent negatives include no abdominal pain, anorexia, arthralgias, change in bowel habit, chest pain, chills, congestion, coughing, fever, headaches, joint swelling, myalgias, nausea, neck pain, numbness, rash, sore throat, swollen glands, urinary symptoms, visual change, vomiting or weakness. The symptoms are aggravated by bending and standing. She has tried rest for the symptoms.   Primary hypertension Improved BP control with losartan and metoprolol metoprolol was initially prescribed to manage migraine headaches Home BP reading AM 140/90, PM 125/80 HR at home 72-80 No palpitations. Reports dizziness with rapid head position. BP Readings from Last 3 Encounters:  02/04/22 110/60  02/02/22 115/77  10/20/21 140/88   Maintain current med dose  reviewed lab results from recent ED visit. F/up in 45months  Iron deficiency anemia due to chronic blood loss Secondary to menorrhagia. This could be contributing to current symptoms (dizziness).  Start oral iron suplement, maintain adequate oral hydration, change position slowly, repeat cbc and iron panel in 59month.  Reviewed past Medical, Social and Family history today.  Outpatient Medications Prior to Visit  Medication Sig Dispense Refill   albuterol (VENTOLIN HFA) 108 (90 Base) MCG/ACT inhaler Inhale 2 puffs into the lungs every 6 (six) hours as needed for wheezing or shortness of breath. 18 g 1   cyclobenzaprine (FLEXERIL) 10 MG tablet Take 1 tablet  (10 mg total) by mouth 2 (two) times daily as needed for muscle spasms. 20 tablet 0   famotidine (PEPCID) 40 MG tablet Take 40 mg by mouth at bedtime.     fluticasone (FLONASE) 50 MCG/ACT nasal spray Place 2 sprays into both nostrils daily. 16 g 0   metoprolol tartrate (LOPRESSOR) 50 MG tablet Take 1 tablet (50 mg total) by mouth 2 (two) times daily. 180 tablet 3   Multiple Vitamins-Minerals (WOMENS MULTIVITAMIN PLUS) TABS Take by mouth daily.     RABEprazole (ACIPHEX) 20 MG tablet Take 2 tablets (40 mg total) by mouth 2 (two) times daily. 120 tablet 2   losartan (COZAAR) 25 MG tablet Take 1 tablet (25 mg total) by mouth daily. 30 tablet 0   albuterol (PROVENTIL) (5 MG/ML) 0.5% nebulizer solution Take 0.5 mLs (2.5 mg total) by nebulization every 6 (six) hours as needed for wheezing or shortness of breath. 60 mL 0   eletriptan (RELPAX) 20 MG tablet Take 1 tablet (20 mg total) by mouth once for 1 dose. May repeat in 2 hours if headache persists or recurs. 12 tablet 0   ibuprofen (ADVIL) 600 MG tablet Take 1 tablet (600 mg total) by mouth every 6 (six) hours as needed. (Patient not taking: Reported on 02/04/2022) 30 tablet 0   mometasone (ASMANEX) 220 MCG/INH inhaler INHALE 2 PUFFS BY MOUTH INTO THE LUNGS DAILY (Patient not taking: Reported on 02/04/2022) 1 each 3   No facility-administered medications prior to visit.    ROS See HPI  Objective:  BP 110/60 (BP Location: Left Arm, Patient Position: Standing)    Pulse 90    Temp (!) 97.2 F (36.2  C) (Temporal)    Ht 4' 9.5" (1.461 m)    Wt 164 lb 9.6 oz (74.7 kg)    LMP 01/20/2022 (Exact Date)    BMI 35.00 kg/m   Physical Exam Vitals reviewed.  HENT:     Right Ear: Tympanic membrane, ear canal and external ear normal.     Left Ear: Tympanic membrane, ear canal and external ear normal.  Eyes:     Extraocular Movements: Extraocular movements intact.     Conjunctiva/sclera: Conjunctivae normal.     Pupils: Pupils are equal, round, and reactive to  light.  Cardiovascular:     Rate and Rhythm: Normal rate and regular rhythm.     Pulses: Normal pulses.     Heart sounds: Normal heart sounds.  Pulmonary:     Effort: Pulmonary effort is normal.     Breath sounds: Normal breath sounds.  Musculoskeletal:     Cervical back: Normal range of motion and neck supple.     Right lower leg: No edema.     Left lower leg: No edema.  Neurological:     Mental Status: She is alert and oriented to person, place, and time.     Gait: Gait is intact.     Comments: Positive epley maneuver (left side)  Psychiatric:        Mood and Affect: Mood normal.        Behavior: Behavior normal.        Thought Content: Thought content normal.   Assessment & Plan:  This visit occurred during the SARS-CoV-2 public health emergency.  Safety protocols were in place, including screening questions prior to the visit, additional usage of staff PPE, and extensive cleaning of exam room while observing appropriate contact time as indicated for disinfecting solutions.   Kimberly Guerra was seen today for acute visit.  Diagnoses and all orders for this visit:  Vertigo  Iron deficiency anemia due to chronic blood loss -     Ferrous Fumarate (HEMOCYTE - 106 MG FE) 324 (106 Fe) MG TABS tablet; Take 1 tablet (106 mg of iron total) by mouth daily. With food -     CBC; Future -     Iron, TIBC and Ferritin Panel; Future  Primary hypertension -     losartan (COZAAR) 25 MG tablet; Take 1 tablet (25 mg total) by mouth daily.   Problem List Items Addressed This Visit       Cardiovascular and Mediastinum   Primary hypertension    Improved BP control with losartan and metoprolol metoprolol was initially prescribed to manage migraine headaches Home BP reading AM 140/90, PM 125/80 HR at home 72-80 No palpitations. Reports dizziness with rapid head position. BP Readings from Last 3 Encounters:  02/04/22 110/60  02/02/22 115/77  10/20/21 140/88   Maintain current med dose   reviewed lab results from recent ED visit. F/up in 61months      Relevant Medications   losartan (COZAAR) 25 MG tablet     Other   Iron deficiency anemia due to chronic blood loss    Secondary to menorrhagia. This could be contributing to current symptoms (dizziness).  Start oral iron suplement, maintain adequate oral hydration, change position slowly, repeat cbc and iron panel in 9month.      Relevant Medications   Ferrous Fumarate (HEMOCYTE - 106 MG FE) 324 (106 Fe) MG TABS tablet   Other Relevant Orders   CBC   Iron, TIBC and Ferritin Panel   Other Visit Diagnoses  Vertigo    -  Primary       Follow-up: Return in about 3 months (around 05/04/2022) for CPE (fasting).  Wilfred Lacy, NP

## 2022-02-04 NOTE — Assessment & Plan Note (Signed)
Improved BP control with losartan and metoprolol metoprolol was initially prescribed to manage migraine headaches Home BP reading AM 140/90, PM 125/80 HR at home 72-80 No palpitations. Reports dizziness with rapid head position. BP Readings from Last 3 Encounters:  02/04/22 110/60  02/02/22 115/77  10/20/21 140/88   Maintain current med dose  reviewed lab results from recent ED visit. F/up in 12months

## 2022-02-26 ENCOUNTER — Other Ambulatory Visit: Payer: Self-pay | Admitting: Nurse Practitioner

## 2022-02-26 DIAGNOSIS — D500 Iron deficiency anemia secondary to blood loss (chronic): Secondary | ICD-10-CM

## 2022-03-23 ENCOUNTER — Other Ambulatory Visit (INDEPENDENT_AMBULATORY_CARE_PROVIDER_SITE_OTHER): Payer: BC Managed Care – PPO

## 2022-03-23 ENCOUNTER — Ambulatory Visit: Payer: BC Managed Care – PPO | Admitting: Nurse Practitioner

## 2022-03-23 DIAGNOSIS — D500 Iron deficiency anemia secondary to blood loss (chronic): Secondary | ICD-10-CM

## 2022-03-23 LAB — CBC
HCT: 34.7 % — ABNORMAL LOW (ref 36.0–46.0)
Hemoglobin: 10.9 g/dL — ABNORMAL LOW (ref 12.0–15.0)
MCHC: 31.4 g/dL (ref 30.0–36.0)
MCV: 71.6 fl — ABNORMAL LOW (ref 78.0–100.0)
Platelets: 525 10*3/uL — ABNORMAL HIGH (ref 150.0–400.0)
RBC: 4.85 Mil/uL (ref 3.87–5.11)
RDW: 18.1 % — ABNORMAL HIGH (ref 11.5–15.5)
WBC: 6.7 10*3/uL (ref 4.0–10.5)

## 2022-03-24 LAB — IRON,TIBC AND FERRITIN PANEL
%SAT: 7 % — ABNORMAL LOW (ref 16–45)
Ferritin: 16 ng/mL (ref 16–154)
Iron: 28 ug/dL — ABNORMAL LOW (ref 40–190)
TIBC: 416 ug/dL (ref 250–450)

## 2022-03-24 NOTE — Addendum Note (Signed)
Addended by: Leana Gamer on: 03/24/2022 02:45 PM ? ? Modules accepted: Orders ? ?

## 2022-03-27 ENCOUNTER — Inpatient Hospital Stay (HOSPITAL_BASED_OUTPATIENT_CLINIC_OR_DEPARTMENT_OTHER): Payer: BC Managed Care – PPO | Admitting: Hematology & Oncology

## 2022-03-27 ENCOUNTER — Other Ambulatory Visit: Payer: Self-pay

## 2022-03-27 ENCOUNTER — Inpatient Hospital Stay: Payer: BC Managed Care – PPO | Attending: Hematology & Oncology

## 2022-03-27 ENCOUNTER — Encounter: Payer: Self-pay | Admitting: Hematology & Oncology

## 2022-03-27 VITALS — BP 148/70 | HR 91 | Temp 98.7°F | Resp 16 | Ht <= 58 in | Wt 163.0 lb

## 2022-03-27 DIAGNOSIS — D573 Sickle-cell trait: Secondary | ICD-10-CM | POA: Insufficient documentation

## 2022-03-27 DIAGNOSIS — N921 Excessive and frequent menstruation with irregular cycle: Secondary | ICD-10-CM | POA: Diagnosis not present

## 2022-03-27 DIAGNOSIS — Z79899 Other long term (current) drug therapy: Secondary | ICD-10-CM | POA: Diagnosis not present

## 2022-03-27 DIAGNOSIS — D500 Iron deficiency anemia secondary to blood loss (chronic): Secondary | ICD-10-CM

## 2022-03-27 HISTORY — DX: Excessive and frequent menstruation with irregular cycle: N92.1

## 2022-03-27 LAB — CBC WITH DIFFERENTIAL (CANCER CENTER ONLY)
Abs Immature Granulocytes: 0.03 10*3/uL (ref 0.00–0.07)
Basophils Absolute: 0 10*3/uL (ref 0.0–0.1)
Basophils Relative: 0 %
Eosinophils Absolute: 0.3 10*3/uL (ref 0.0–0.5)
Eosinophils Relative: 3 %
HCT: 32.2 % — ABNORMAL LOW (ref 36.0–46.0)
Hemoglobin: 10.4 g/dL — ABNORMAL LOW (ref 12.0–15.0)
Immature Granulocytes: 0 %
Lymphocytes Relative: 19 %
Lymphs Abs: 1.7 10*3/uL (ref 0.7–4.0)
MCH: 23 pg — ABNORMAL LOW (ref 26.0–34.0)
MCHC: 32.3 g/dL (ref 30.0–36.0)
MCV: 71.1 fL — ABNORMAL LOW (ref 80.0–100.0)
Monocytes Absolute: 0.3 10*3/uL (ref 0.1–1.0)
Monocytes Relative: 4 %
Neutro Abs: 6.7 10*3/uL (ref 1.7–7.7)
Neutrophils Relative %: 74 %
Platelet Count: 487 10*3/uL — ABNORMAL HIGH (ref 150–400)
RBC: 4.53 MIL/uL (ref 3.87–5.11)
RDW: 16.9 % — ABNORMAL HIGH (ref 11.5–15.5)
WBC Count: 9.1 10*3/uL (ref 4.0–10.5)
nRBC: 0 % (ref 0.0–0.2)

## 2022-03-27 LAB — CMP (CANCER CENTER ONLY)
ALT: 8 U/L (ref 0–44)
AST: 14 U/L — ABNORMAL LOW (ref 15–41)
Albumin: 4.3 g/dL (ref 3.5–5.0)
Alkaline Phosphatase: 99 U/L (ref 38–126)
Anion gap: 8 (ref 5–15)
BUN: 6 mg/dL (ref 6–20)
CO2: 28 mmol/L (ref 22–32)
Calcium: 9.7 mg/dL (ref 8.9–10.3)
Chloride: 104 mmol/L (ref 98–111)
Creatinine: 0.74 mg/dL (ref 0.44–1.00)
GFR, Estimated: >60 mL/min (ref >60)
Glucose, Bld: 126 mg/dL — ABNORMAL HIGH (ref 70–99)
Potassium: 3.9 mmol/L (ref 3.5–5.1)
Sodium: 140 mmol/L (ref 135–145)
Total Bilirubin: 0.3 mg/dL (ref 0.3–1.2)
Total Protein: 7.8 g/dL (ref 6.5–8.1)

## 2022-03-27 LAB — RETICULOCYTES
Immature Retic Fract: 16.1 % — ABNORMAL HIGH (ref 2.3–15.9)
RBC.: 4.51 MIL/uL (ref 3.87–5.11)
Retic Count, Absolute: 51.4 10*3/uL (ref 19.0–186.0)
Retic Ct Pct: 1.1 % (ref 0.4–3.1)

## 2022-03-27 LAB — SAVE SMEAR(SSMR), FOR PROVIDER SLIDE REVIEW

## 2022-03-27 MED ORDER — FOLIC ACID 1 MG PO TABS: 2.0000 mg | ORAL_TABLET | Freq: Every day | ORAL | 6 refills | Status: DC

## 2022-03-27 NOTE — Progress Notes (Signed)
Referral MD ? ?Reason for Referral: Iron deficiency anemia secondary to menometrorrhagia; sickle cell trait ? ?Chief Complaint  ?Patient presents with  ? New Patient (Initial Visit)  ?: My iron is very low. ? ?HPI: Kimberly Guerra is an incredibly nice 36 year old Afro-American female.  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  With really fun about her is that she grew up in [place], [place].  We talked about this for quite a while. ? ?She does have menometrorrhagia  She is followed by Gynecology. ? ?She was placed on some oral iron.  She has a hard time taking the oral iron. ? ?She does have sickle cell trait.  She is not on folic acid for this.  I think that she needs to be on 2 mg of folic acid daily. ? ?She has had no obvious bleeding otherwise. ? ?She says that she does have endometriosis.  She has had surgery for this in the past. ? ?She does not smoke.  She has some alcohol use but really not much. ? ?She has had no cough or shortness of breath.  I do not think there is been any problems with COVID. ? ?She did have iron studies done 4 days ago.  Her ferritin was 16 with an iron saturation of 7%.  She had a CBC that was done which showed a white cell count 6.7.  Hemoglobin 10.9.  Platelet count 525,000. ? ?There is been no problems with diabetes.  She does have migraines.  She does have high blood pressure. ? ?She was referred to the [facility] so we try to help with her iron deficiency. ? ?Overall, I would say that her performance status is probably ECOG 1. ? ? ? ?Past Medical History:  ?Diagnosis Date  ? Anemia   ? Asthma   ? prn inhaler  ? History of gestational hypertension   ? Irregular menstrual cycle   ? Menometrorrhagia 03/27/2022  ? Menorrhagia   ? Migraines   ? Seasonal allergies   ? Sickle cell trait ([place])   ?: ? ? ?Past Surgical History:  ?Procedure Laterality Date  ? CARPAL TUNNEL RELEASE Right 03/25/2015  ? Procedure:  ENDOSCOPIC CARPAL TUNNEL RELEASE;  Surgeon: Milly Jakob, MD;  Location: [facility];  Service: Orthopedics;  Laterality: Right;  ? CESAREAN SECTION  01/02/2009  ? DORSAL COMPARTMENT RELEASE Right 03/25/2015  ? Procedure: RIGHT WRIST DEQUERVAIN RELEASE AND ;  Surgeon: Milly Jakob, MD;  Location: [facility];  Service: Orthopedics;  Laterality: Right;  ? HYSTEROSCOPY WITH D & C N/A 06/24/2017  ? Procedure: DILATATION AND CURETTAGE /HYSTEROSCOPY  pelvic biopsy and ablation of endometriosis;  Surgeon: Thurnell Lose, MD;  [facility];  Service: Gynecology;  Laterality: N/A;  ? LAPAROSCOPY N/A 06/24/2017  ? Procedure: LAPAROSCOPY DIAGNOSTIC;  Surgeon: Thurnell Lose, MD;  Location: [facility];  Service: Gynecology;  Laterality: N/A;  ? WISDOM TOOTH EXTRACTION    ?: ? ? ?Current Outpatient Medications:  ?  albuterol (PROVENTIL) (5 MG/ML) 0.5% nebulizer solution, Take 0.5 mLs (2.5 mg total) by nebulization every 6 (six) hours as needed for wheezing or shortness of breath., Disp: 60 mL, Rfl: 0 ?  albuterol (VENTOLIN HFA) 108 (90 Base) MCG/ACT inhaler, Inhale 2 puffs into the lungs every 6 (six) hours as needed for wheezing or shortness of breath., Disp: 18 g, Rfl: 1 ?  cyclobenzaprine (FLEXERIL) 10 MG tablet, Take 1 tablet (10 mg  total) by mouth 2 (two) times daily as needed for muscle spasms., Disp: 20 tablet, Rfl: 0 ?  eletriptan (RELPAX) 20 MG tablet, Take 1 tablet (20 mg total) by mouth once for 1 dose. May repeat in 2 hours if headache persists or recurs., Disp: 12 tablet, Rfl: 0 ?  famotidine (PEPCID) 40 MG tablet, Take 40 mg by mouth at bedtime., Disp: , Rfl:  ?  fluticasone (FLONASE) 50 MCG/ACT nasal spray, Place 2 sprays into both nostrils daily., Disp: 16 g, Rfl: 0 ?  losartan (COZAAR) 25 MG tablet, Take 1 tablet (25 mg total) by mouth daily., Disp: 90 tablet, Rfl: 1 ?  metoprolol tartrate (LOPRESSOR) 50 MG tablet, Take 1 tablet (50 mg total) by mouth 2 (two) times daily., Disp: 180 tablet,  Rfl: 3 ?  Multiple Vitamins-Minerals (WOMENS MULTIVITAMIN PLUS) TABS, Take by mouth daily., Disp: , Rfl:  ?  RABEprazole (ACIPHEX) 20 MG tablet, Take 2 tablets (40 mg total) by mouth 2 (two) times daily., Disp: 120 tablet, Rfl: 2: ? ?: ? ? ?Allergies  ?Allergen Reactions  ? Topamax [Topiramate]   ?  Heart palpitations  ?: ? ? ?Family History  ?Problem Relation Age of Onset  ? Hypertension Mother   ? Asthma Father   ? Hypertension Father   ? Asthma Brother   ? Asthma Paternal Aunt   ? Diabetes Paternal Aunt   ? Cancer Maternal Grandfather   ?     oral cancer  ? Diabetes Maternal Grandfather   ? Hypertension Maternal Grandfather   ? Colon cancer Neg Hx   ? Esophageal cancer Neg Hx   ? Rectal cancer Neg Hx   ? Stomach cancer Neg Hx   ?: ? ? ?Social History  ? ?Socioeconomic History  ? Marital status: Single  ?  Spouse name: Not on file  ? Number of children: 1  ? Years of education: 80  ? Highest education level: Not on file  ?Occupational History  ? Occupation: Press photographer  ?  Employer: [employer]  ?Tobacco Use  ? Smoking status: Never  ? Smokeless tobacco: Never  ?Vaping Use  ? Vaping Use: Never used  ?Substance and Sexual Activity  ? Alcohol use: Yes  ?  Alcohol/week: 3.0 standard drinks  ?  Types: 3 Glasses of wine per week  ? Drug use: No  ? Sexual activity: Yes  ?  Birth control/protection: None  ?Other Topics Concern  ? Not on file  ?Social History Narrative  ? Lives with son (2010), employeed at [employer] part-time. Completed some college classes.   ? ?Social Determinants of Health  ? ?Financial Resource Strain: Not on file  ?Food Insecurity: Not on file  ?Transportation Needs: Not on file  ?Physical Activity: Not on file  ?Stress: Not on file  ?Social Connections: Not on file  ?Intimate Partner Violence: Not on file  ?: ?Review of Systems  ?Constitutional:  Positive for malaise/fatigue.  ?HENT: Negative.    ?Eyes: Negative.   ?Respiratory: Negative.    ?Cardiovascular: Negative.   ?Gastrointestinal: Negative.    ?Genitourinary: Negative.   ?Musculoskeletal: Negative.   ?Skin: Negative.   ?Neurological:  Positive for headaches.  ?Endo/Heme/Allergies: Negative.   ?Psychiatric/Behavioral: Negative.    ? ? ?Exam: ?'@IPVITALS'$ @ ?Physical Exam ?Vitals reviewed.  ?HENT:  ?   Head: Normocephalic and atraumatic.  ?Eyes:  ?   Pupils: Pupils are equal, round, and reactive to light.  ?Cardiovascular:  ?   Rate and Rhythm: Normal rate and regular rhythm.  ?  Heart sounds: Normal heart sounds.  ?Pulmonary:  ?   Effort: Pulmonary effort is normal.  ?   Breath sounds: Normal breath sounds.  ?Abdominal:  ?   General: Bowel sounds are normal.  ?   Palpations: Abdomen is soft.  ?Musculoskeletal:     ?   General: No tenderness or deformity. Normal range of motion.  ?   Cervical back: Normal range of motion.  ?Lymphadenopathy:  ?   Cervical: No cervical adenopathy.  ?Skin: ?   General: Skin is warm and dry.  ?   Findings: No erythema or rash.  ?Neurological:  ?   Mental Status: She is alert and oriented to person, place, and time.  ?Psychiatric:     ?   Behavior: Behavior normal.     ?   Thought Content: Thought content normal.     ?   Judgment: Judgment normal.  ? ? ? ? ?Recent Labs  ?  03/27/22 ?1402  ?WBC 9.1  ?HGB 10.4*  ?HCT 32.2*  ?PLT 487*  ? ? ?Recent Labs  ?  03/27/22 ?1402  ?NA 140  ?K 3.9  ?CL 104  ?CO2 28  ?GLUCOSE 126*  ?BUN 6  ?CREATININE 0.74  ?CALCIUM 9.7  ? ? ?Blood smear review: Microcytic and hypochromic red blood cells.  There is no nucleated red blood cells.  There may be a couple target cells.  There are no schistocytes or spherocytes.  I see no rouleaux formation.  White blood cells are normal in morphology maturation.  There is no immature myeloid or lymphoid forms.  There is no hypersegmented polys.  Platelets are increased in number.  Platelets are uniform in size.  Platelets are well granulated. ? ?Pathology: None ? ? ? ?Assessment and Plan: Kimberly Guerra is a very charming 36 year old Afro-American female.  Again she  is a whole lot of fun talking to.  We talked a lot about [place], [place].  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 ? ?She clearly is iron deficient.  We are going to have to see if w

## 2022-03-30 ENCOUNTER — Encounter: Payer: Self-pay | Admitting: *Deleted

## 2022-03-30 LAB — FERRITIN: Ferritin: 25 ng/mL (ref 11–307)

## 2022-03-30 LAB — IRON AND IRON BINDING CAPACITY (CC-WL,HP ONLY)
Iron: 25 ug/dL — ABNORMAL LOW (ref 28–170)
Saturation Ratios: 6 % — ABNORMAL LOW (ref 10.4–31.8)
TIBC: 452 ug/dL — ABNORMAL HIGH (ref 250–450)
UIBC: 427 ug/dL (ref 148–442)

## 2022-03-31 ENCOUNTER — Other Ambulatory Visit: Payer: Self-pay | Admitting: Family

## 2022-03-31 LAB — HGB SOLUBILITY: Hgb Solubility: POSITIVE — AB

## 2022-03-31 LAB — HGB FRACTIONATION CASCADE
Hgb A2: 2.7 % (ref 1.8–3.2)
Hgb A: 59.4 % — ABNORMAL LOW (ref 96.4–98.8)
Hgb F: 0 % (ref 0.0–2.0)
Hgb S: 37.9 % — ABNORMAL HIGH

## 2022-04-02 ENCOUNTER — Inpatient Hospital Stay: Payer: BC Managed Care – PPO | Attending: Hematology & Oncology

## 2022-04-02 VITALS — BP 124/90 | HR 77 | Temp 98.7°F | Resp 18

## 2022-04-02 DIAGNOSIS — N921 Excessive and frequent menstruation with irregular cycle: Secondary | ICD-10-CM

## 2022-04-02 DIAGNOSIS — D500 Iron deficiency anemia secondary to blood loss (chronic): Secondary | ICD-10-CM | POA: Insufficient documentation

## 2022-04-02 DIAGNOSIS — N920 Excessive and frequent menstruation with regular cycle: Secondary | ICD-10-CM | POA: Diagnosis not present

## 2022-04-02 MED ORDER — SODIUM CHLORIDE 0.9 % IV SOLN: Freq: Once | INTRAVENOUS | Status: AC

## 2022-04-02 MED ORDER — SODIUM CHLORIDE 0.9 % IV SOLN
300.0000 mg | Freq: Once | INTRAVENOUS | Status: AC
  Administered 2022-04-02: 300 mg via INTRAVENOUS
  Filled 2022-04-02: qty 300

## 2022-04-02 NOTE — Patient Instructions (Signed)

## 2022-04-30 ENCOUNTER — Inpatient Hospital Stay (HOSPITAL_BASED_OUTPATIENT_CLINIC_OR_DEPARTMENT_OTHER): Payer: BC Managed Care – PPO | Admitting: Hematology & Oncology

## 2022-04-30 ENCOUNTER — Other Ambulatory Visit: Payer: Self-pay

## 2022-04-30 ENCOUNTER — Inpatient Hospital Stay: Payer: BC Managed Care – PPO | Attending: Hematology & Oncology

## 2022-04-30 ENCOUNTER — Encounter: Payer: Self-pay | Admitting: Hematology & Oncology

## 2022-04-30 VITALS — BP 133/77 | HR 80 | Temp 98.9°F | Resp 18 | Wt 163.0 lb

## 2022-04-30 DIAGNOSIS — N921 Excessive and frequent menstruation with irregular cycle: Secondary | ICD-10-CM | POA: Diagnosis not present

## 2022-04-30 DIAGNOSIS — D500 Iron deficiency anemia secondary to blood loss (chronic): Secondary | ICD-10-CM | POA: Insufficient documentation

## 2022-04-30 DIAGNOSIS — N920 Excessive and frequent menstruation with regular cycle: Secondary | ICD-10-CM | POA: Insufficient documentation

## 2022-04-30 DIAGNOSIS — D573 Sickle-cell trait: Secondary | ICD-10-CM | POA: Diagnosis not present

## 2022-04-30 LAB — CMP (CANCER CENTER ONLY)
ALT: 9 U/L (ref 0–44)
AST: 12 U/L — ABNORMAL LOW (ref 15–41)
Albumin: 4.3 g/dL (ref 3.5–5.0)
Alkaline Phosphatase: 87 U/L (ref 38–126)
Anion gap: 6 (ref 5–15)
BUN: 10 mg/dL (ref 6–20)
CO2: 28 mmol/L (ref 22–32)
Calcium: 9.6 mg/dL (ref 8.9–10.3)
Chloride: 105 mmol/L (ref 98–111)
Creatinine: 0.67 mg/dL (ref 0.44–1.00)
GFR, Estimated: >60 mL/min (ref >60)
Glucose, Bld: 91 mg/dL (ref 70–99)
Potassium: 4.3 mmol/L (ref 3.5–5.1)
Sodium: 139 mmol/L (ref 135–145)
Total Bilirubin: 0.3 mg/dL (ref 0.3–1.2)
Total Protein: 7.7 g/dL (ref 6.5–8.1)

## 2022-04-30 LAB — IRON AND IRON BINDING CAPACITY (CC-WL,HP ONLY)
Iron: 44 ug/dL (ref 28–170)
Saturation Ratios: 11 % (ref 10.4–31.8)
TIBC: 395 ug/dL (ref 250–450)
UIBC: 351 ug/dL (ref 148–442)

## 2022-04-30 LAB — CBC WITH DIFFERENTIAL (CANCER CENTER ONLY)
Abs Immature Granulocytes: 0.04 10*3/uL (ref 0.00–0.07)
Basophils Absolute: 0 10*3/uL (ref 0.0–0.1)
Basophils Relative: 0 %
Eosinophils Absolute: 0.2 10*3/uL (ref 0.0–0.5)
Eosinophils Relative: 2 %
HCT: 35 % — ABNORMAL LOW (ref 36.0–46.0)
Hemoglobin: 11.2 g/dL — ABNORMAL LOW (ref 12.0–15.0)
Immature Granulocytes: 0 %
Lymphocytes Relative: 22 %
Lymphs Abs: 2.1 10*3/uL (ref 0.7–4.0)
MCH: 23.8 pg — ABNORMAL LOW (ref 26.0–34.0)
MCHC: 32 g/dL (ref 30.0–36.0)
MCV: 74.3 fL — ABNORMAL LOW (ref 80.0–100.0)
Monocytes Absolute: 0.6 10*3/uL (ref 0.1–1.0)
Monocytes Relative: 6 %
Neutro Abs: 6.3 10*3/uL (ref 1.7–7.7)
Neutrophils Relative %: 70 %
Platelet Count: 461 10*3/uL — ABNORMAL HIGH (ref 150–400)
RBC: 4.71 MIL/uL (ref 3.87–5.11)
RDW: 18.6 % — ABNORMAL HIGH (ref 11.5–15.5)
WBC Count: 9.2 10*3/uL (ref 4.0–10.5)
nRBC: 0 % (ref 0.0–0.2)

## 2022-04-30 LAB — RETICULOCYTES
Immature Retic Fract: 20.1 % — ABNORMAL HIGH (ref 2.3–15.9)
RBC.: 4.73 MIL/uL (ref 3.87–5.11)
Retic Count, Absolute: 80.4 10*3/uL (ref 19.0–186.0)
Retic Ct Pct: 1.7 % (ref 0.4–3.1)

## 2022-04-30 LAB — FERRITIN: Ferritin: 83 ng/mL (ref 11–307)

## 2022-04-30 LAB — SAVE SMEAR(SSMR), FOR PROVIDER SLIDE REVIEW

## 2022-04-30 NOTE — Progress Notes (Signed)
?Hematology and Oncology Follow Up Visit ? ?Kimberly Guerra ?[id number] ?10/03/1986 36 y.o. ?04/30/2022 ? ? ?Principle Diagnosis:  ?Iron deficiency anemia-menometrorrhagia ?Sickle cell trait ? ?Current Therapy:   ?IV iron-Venofer given on 04/03/2022 ?Folic acid 2 mg p.o. daily ?    ?Interim History:  Kimberly Guerra is back for follow-up.  This is her second office visit.  We first saw her back in late March.  At that time, she had iron deficiency.  At that time, her ferritin was 25 with an iron saturation of 6%. ? ?She got IV iron.  She said for a week she felt quite good.  Then things dropped down again. ? 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  I am unsure when she goes back to see her gynecologist. ? ?She has had no change in bowel or bladder habits.  She has had no rashes.  She still chews ice. ? ?She has had no cough or shortness of breath.  There is no nausea or vomiting.  She is eating well. ? ?Overall, her performance status is ECOG 1. ? ?Medications:  ?Current Outpatient Medications:  ?  albuterol (PROVENTIL) (5 MG/ML) 0.5% nebulizer solution, Take 0.5 mLs (2.5 mg total) by nebulization every 6 (six) hours as needed for wheezing or shortness of breath., Disp: 60 mL, Rfl: 0 ?  albuterol (VENTOLIN HFA) 108 (90 Base) MCG/ACT inhaler, Inhale 2 puffs into the lungs every 6 (six) hours as needed for wheezing or shortness of breath., Disp: 18 g, Rfl: 1 ?  cyclobenzaprine (FLEXERIL) 10 MG tablet, Take 1 tablet (10 mg total) by mouth 2 (two) times daily as needed for muscle spasms., Disp: 20 tablet, Rfl: 0 ?  eletriptan (RELPAX) 20 MG tablet, Take 1 tablet (20 mg total) by mouth once for 1 dose. May repeat in 2 hours if headache persists or recurs., Disp: 12 tablet, Rfl: 0 ?  famotidine (PEPCID) 40 MG tablet, Take 40 mg by mouth at bedtime., Disp: , Rfl:  ?  fluticasone (FLONASE) 50 MCG/ACT nasal spray, Place 2 sprays into both nostrils daily., Disp: 16 g, Rfl: 0 ?  folic acid (FOLVITE) 1 MG  tablet, Take 2 tablets (2 mg total) by mouth daily., Disp: 180 tablet, Rfl: 6 ?  losartan (COZAAR) 25 MG tablet, Take 1 tablet (25 mg total) by mouth daily., Disp: 90 tablet, Rfl: 1 ?  metoprolol tartrate (LOPRESSOR) 50 MG tablet, Take 1 tablet (50 mg total) by mouth 2 (two) times daily., Disp: 180 tablet, Rfl: 3 ?  Multiple Vitamins-Minerals (WOMENS MULTIVITAMIN PLUS) TABS, Take by mouth daily., Disp: , Rfl:  ?  RABEprazole (ACIPHEX) 20 MG tablet, Take 2 tablets (40 mg total) by mouth 2 (two) times daily., Disp: 120 tablet, Rfl: 2 ? ?Allergies:  ?Allergies  ?Allergen Reactions  ? Topamax [Topiramate]   ?  Heart palpitations  ? ? ?Past Medical History, Surgical history, Social history, and Family History were reviewed and updated. ? ?Review of Systems: ?Review of Systems  ?Constitutional: Negative.   ?HENT:  Negative.    ?Eyes: Negative.   ?Respiratory: Negative.    ?Cardiovascular: Negative.   ?Gastrointestinal: Negative.   ?Endocrine: Negative.   ?Genitourinary: Negative.    ?Musculoskeletal: Negative.   ?Skin: Negative.   ?Neurological: Negative.   ?Hematological: Negative.   ?Psychiatric/Behavioral: Negative.    ? ?Physical Exam: ? weight is 163 lb (73.9 kg). Her oral temperature is 98.9 ?F (37.2 ?C). Her blood pressure is 133/77 and her  pulse is 80. Her respiration is 18 and oxygen saturation is 100%.  ? ?Wt Readings from Last 3 Encounters:  ?04/30/22 163 lb (73.9 kg)  ?03/27/22 163 lb (73.9 kg)  ?02/04/22 164 lb 9.6 oz (74.7 kg)  ? ? ?Physical Exam ?Vitals reviewed.  ?HENT:  ?   Head: Normocephalic and atraumatic.  ?Eyes:  ?   Pupils: Pupils are equal, round, and reactive to light.  ?Cardiovascular:  ?   Rate and Rhythm: Normal rate and regular rhythm.  ?   Heart sounds: Normal heart sounds.  ?Pulmonary:  ?   Effort: Pulmonary effort is normal.  ?   Breath sounds: Normal breath sounds.  ?Abdominal:  ?   General: Bowel sounds are normal.  ?   Palpations: Abdomen is soft.  ?Musculoskeletal:     ?   General: No  tenderness or deformity. Normal range of motion.  ?   Cervical back: Normal range of motion.  ?Lymphadenopathy:  ?   Cervical: No cervical adenopathy.  ?Skin: ?   General: Skin is warm and dry.  ?   Findings: No erythema or rash.  ?Neurological:  ?   Mental Status: She is alert and oriented to person, place, and time.  ?Psychiatric:     ?   Behavior: Behavior normal.     ?   Thought Content: Thought content normal.     ?   Judgment: Judgment normal.  ? ? ? ?Lab Results  ?Component Value Date  ? WBC 9.2 04/30/2022  ? HGB 11.2 (L) 04/30/2022  ? HCT 35.0 (L) 04/30/2022  ? MCV 74.3 (L) 04/30/2022  ? PLT 461 (H) 04/30/2022  ? ?  Chemistry   ?   ?Component Value Date/Time  ? NA 139 04/30/2022 0806  ? K 4.3 04/30/2022 0806  ? CL 105 04/30/2022 0806  ? CO2 28 04/30/2022 0806  ? BUN 10 04/30/2022 0806  ? CREATININE 0.67 04/30/2022 0806  ? CREATININE 0.69 12/06/2013 1631  ?    ?Component Value Date/Time  ? CALCIUM 9.6 04/30/2022 0806  ? ALKPHOS 87 04/30/2022 0806  ? AST 12 (L) 04/30/2022 0806  ? ALT 9 04/30/2022 0806  ? BILITOT 0.3 04/30/2022 0806  ?  ? ? ?Impression and Plan: ?Kimberly Guerra is a very charming 36 year old Afro-American female.  She is a whole lot of fun to talk to.  She has a very interesting job. ? ?Her hemoglobin is a little bit better.  She still has the low MCV.  This might be from the sickle cell trait. ? ?We will see about her iron studies.  It is possible that we may have to give her more iron. ? ?She is taking the folic acid for the sickle cell trait. ? ?Again, we will see what her iron level looks like.  I will likely plan to get her back in about 6 weeks or so.  Hopefully, we can get her hemoglobin little bit higher so that she will feel better. ? ? ?Volanda Napoleon, MD ?5/4/20238:50 AM  ?

## 2022-05-07 ENCOUNTER — Encounter: Payer: BC Managed Care – PPO | Admitting: Nurse Practitioner

## 2022-05-08 ENCOUNTER — Inpatient Hospital Stay: Payer: BC Managed Care – PPO

## 2022-05-08 VITALS — BP 127/74 | HR 83 | Temp 98.0°F | Resp 18

## 2022-05-08 DIAGNOSIS — D500 Iron deficiency anemia secondary to blood loss (chronic): Secondary | ICD-10-CM | POA: Diagnosis not present

## 2022-05-08 DIAGNOSIS — N921 Excessive and frequent menstruation with irregular cycle: Secondary | ICD-10-CM

## 2022-05-08 MED ORDER — SODIUM CHLORIDE 0.9 % IV SOLN
300.0000 mg | Freq: Once | INTRAVENOUS | Status: AC
  Administered 2022-05-08: 300 mg via INTRAVENOUS
  Filled 2022-05-08: qty 300

## 2022-05-08 MED ORDER — SODIUM CHLORIDE 0.9 % IV SOLN: Freq: Once | INTRAVENOUS | Status: AC

## 2022-05-08 NOTE — Patient Instructions (Signed)

## 2022-05-15 ENCOUNTER — Inpatient Hospital Stay: Payer: BC Managed Care – PPO

## 2022-05-15 VITALS — BP 134/81 | HR 89 | Temp 97.3°F | Resp 17

## 2022-05-15 DIAGNOSIS — D500 Iron deficiency anemia secondary to blood loss (chronic): Secondary | ICD-10-CM | POA: Diagnosis not present

## 2022-05-15 DIAGNOSIS — N921 Excessive and frequent menstruation with irregular cycle: Secondary | ICD-10-CM

## 2022-05-15 MED ORDER — SODIUM CHLORIDE 0.9 % IV SOLN: Freq: Once | INTRAVENOUS | Status: AC

## 2022-05-15 MED ORDER — SODIUM CHLORIDE 0.9 % IV SOLN
300.0000 mg | Freq: Once | INTRAVENOUS | Status: AC
  Administered 2022-05-15: 300 mg via INTRAVENOUS
  Filled 2022-05-15: qty 300

## 2022-05-15 NOTE — Patient Instructions (Signed)

## 2022-05-28 ENCOUNTER — Other Ambulatory Visit: Payer: Self-pay | Admitting: Nurse Practitioner

## 2022-05-28 DIAGNOSIS — D500 Iron deficiency anemia secondary to blood loss (chronic): Secondary | ICD-10-CM

## 2022-06-11 ENCOUNTER — Inpatient Hospital Stay: Payer: BC Managed Care – PPO | Attending: Hematology & Oncology

## 2022-06-11 ENCOUNTER — Other Ambulatory Visit: Payer: Self-pay | Admitting: Oncology

## 2022-06-11 ENCOUNTER — Inpatient Hospital Stay: Payer: BC Managed Care – PPO | Admitting: Hematology & Oncology

## 2022-06-11 ENCOUNTER — Encounter: Payer: Self-pay | Admitting: Hematology & Oncology

## 2022-06-11 VITALS — BP 131/86 | HR 77 | Temp 98.3°F | Resp 20 | Ht <= 58 in | Wt 162.8 lb

## 2022-06-11 DIAGNOSIS — D500 Iron deficiency anemia secondary to blood loss (chronic): Secondary | ICD-10-CM | POA: Insufficient documentation

## 2022-06-11 DIAGNOSIS — D573 Sickle-cell trait: Secondary | ICD-10-CM | POA: Insufficient documentation

## 2022-06-11 DIAGNOSIS — N921 Excessive and frequent menstruation with irregular cycle: Secondary | ICD-10-CM | POA: Diagnosis present

## 2022-06-11 HISTORY — DX: Sickle-cell trait: D57.3

## 2022-06-11 LAB — CMP (CANCER CENTER ONLY)
ALT: 11 U/L (ref 0–44)
AST: 13 U/L — ABNORMAL LOW (ref 15–41)
Albumin: 4.3 g/dL (ref 3.5–5.0)
Alkaline Phosphatase: 80 U/L (ref 38–126)
Anion gap: 7 (ref 5–15)
BUN: 10 mg/dL (ref 6–20)
CO2: 28 mmol/L (ref 22–32)
Calcium: 9.5 mg/dL (ref 8.9–10.3)
Chloride: 105 mmol/L (ref 98–111)
Creatinine: 0.7 mg/dL (ref 0.44–1.00)
GFR, Estimated: >60 mL/min (ref >60)
Glucose, Bld: 96 mg/dL (ref 70–99)
Potassium: 4.5 mmol/L (ref 3.5–5.1)
Sodium: 140 mmol/L (ref 135–145)
Total Bilirubin: 0.4 mg/dL (ref 0.3–1.2)
Total Protein: 8.1 g/dL (ref 6.5–8.1)

## 2022-06-11 LAB — RETICULOCYTES
Immature Retic Fract: 19.8 % — ABNORMAL HIGH (ref 2.3–15.9)
RBC.: 4.91 MIL/uL (ref 3.87–5.11)
Retic Count, Absolute: 84.9 10*3/uL (ref 19.0–186.0)
Retic Ct Pct: 1.7 % (ref 0.4–3.1)

## 2022-06-11 LAB — CBC WITH DIFFERENTIAL (CANCER CENTER ONLY)
Abs Immature Granulocytes: 0.06 10*3/uL (ref 0.00–0.07)
Basophils Absolute: 0 10*3/uL (ref 0.0–0.1)
Basophils Relative: 0 %
Eosinophils Absolute: 0.1 10*3/uL (ref 0.0–0.5)
Eosinophils Relative: 2 %
HCT: 37.7 % (ref 36.0–46.0)
Hemoglobin: 12.3 g/dL (ref 12.0–15.0)
Immature Granulocytes: 1 %
Lymphocytes Relative: 20 %
Lymphs Abs: 1.7 10*3/uL (ref 0.7–4.0)
MCH: 25.2 pg — ABNORMAL LOW (ref 26.0–34.0)
MCHC: 32.6 g/dL (ref 30.0–36.0)
MCV: 77.1 fL — ABNORMAL LOW (ref 80.0–100.0)
Monocytes Absolute: 0.4 10*3/uL (ref 0.1–1.0)
Monocytes Relative: 5 %
Neutro Abs: 6.2 10*3/uL (ref 1.7–7.7)
Neutrophils Relative %: 72 %
Platelet Count: 421 10*3/uL — ABNORMAL HIGH (ref 150–400)
RBC: 4.89 MIL/uL (ref 3.87–5.11)
RDW: 19.5 % — ABNORMAL HIGH (ref 11.5–15.5)
WBC Count: 8.6 10*3/uL (ref 4.0–10.5)
nRBC: 0 % (ref 0.0–0.2)

## 2022-06-11 LAB — IRON AND IRON BINDING CAPACITY (CC-WL,HP ONLY)
Iron: 47 ug/dL (ref 28–170)
Saturation Ratios: 13 % (ref 10.4–31.8)
TIBC: 358 ug/dL (ref 250–450)
UIBC: 311 ug/dL (ref 148–442)

## 2022-06-11 LAB — FERRITIN: Ferritin: 241 ng/mL (ref 11–307)

## 2022-06-11 NOTE — Progress Notes (Signed)
Hematology and Oncology Follow Up Visit  Kimberly Guerra [id number] 04-16-1986 36 y.o. 06/11/2022   Principle Diagnosis:  Iron deficiency anemia-menometrorrhagia Sickle cell trait  Current Therapy:   IV iron-Venofer given on 14/48/1856 Folic acid 2 mg p.o. daily     Interim History:  [name] is back for follow-up.  She is feeling a little bit better.  She got iron back in May.  At that time, her iron saturation was 11%.  She is still quite busy.  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  She and her family were in the [place] recently.  They did have a good time.  She has had no issues with cough.  There is been no fever.  She has had no nausea or vomiting.  She has had no change in bowel or bladder habits.  She still has her heavy cycles.  I think that her gynecologist try to help with this.  There has been no problems with rashes.  She has had no leg swelling.  Overall, I would say performance status is probably ECOG 1.    Medications:  Current Outpatient Medications:    albuterol (VENTOLIN HFA) 108 (90 Base) MCG/ACT inhaler, Inhale 2 puffs into the lungs every 6 (six) hours as needed for wheezing or shortness of breath., Disp: 18 g, Rfl: 1   cyclobenzaprine (FLEXERIL) 10 MG tablet, Take 1 tablet (10 mg total) by mouth 2 (two) times daily as needed for muscle spasms., Disp: 20 tablet, Rfl: 0   famotidine (PEPCID) 40 MG tablet, Take 40 mg by mouth at bedtime., Disp: , Rfl:    fluticasone (FLONASE) 50 MCG/ACT nasal spray, Place 2 sprays into both nostrils daily., Disp: 16 g, Rfl: 0   folic acid (FOLVITE) 1 MG tablet, Take 2 tablets (2 mg total) by mouth daily., Disp: 180 tablet, Rfl: 6   metoprolol tartrate (LOPRESSOR) 50 MG tablet, Take 1 tablet (50 mg total) by mouth 2 (two) times daily., Disp: 180 tablet, Rfl: 3   Multiple Vitamins-Minerals (WOMENS MULTIVITAMIN PLUS) TABS, Take by mouth daily., Disp: , Rfl:    RABEprazole (ACIPHEX) 20 MG tablet, Take  2 tablets (40 mg total) by mouth 2 (two) times daily., Disp: 120 tablet, Rfl: 2   albuterol (PROVENTIL) (5 MG/ML) 0.5% nebulizer solution, Take 0.5 mLs (2.5 mg total) by nebulization every 6 (six) hours as needed for wheezing or shortness of breath., Disp: 60 mL, Rfl: 0   eletriptan (RELPAX) 20 MG tablet, Take 1 tablet (20 mg total) by mouth once for 1 dose. May repeat in 2 hours if headache persists or recurs., Disp: 12 tablet, Rfl: 0   losartan (COZAAR) 25 MG tablet, Take 1 tablet (25 mg total) by mouth daily., Disp: 90 tablet, Rfl: 1  Allergies:  Allergies  Allergen Reactions   Topamax [Topiramate]     Heart palpitations    Past Medical History, Surgical history, Social history, and Family History were reviewed and updated.  Review of Systems: Review of Systems  Constitutional: Negative.   HENT:  Negative.    Eyes: Negative.   Respiratory: Negative.    Cardiovascular: Negative.   Gastrointestinal: Negative.   Endocrine: Negative.   Genitourinary: Negative.    Musculoskeletal: Negative.   Skin: Negative.   Neurological: Negative.   Hematological: Negative.   Psychiatric/Behavioral: Negative.      Physical Exam:  height is '4\' 10"'$  (1.473 m) and weight is 162 lb 12.8 oz (73.8 kg). Her temperature is 98.3 F (  36.8 C). Her blood pressure is 131/86 and her pulse is 77. Her respiration is 20.   Wt Readings from Last 3 Encounters:  06/11/22 162 lb 12.8 oz (73.8 kg)  04/30/22 163 lb (73.9 kg)  03/27/22 163 lb (73.9 kg)    Physical Exam Vitals reviewed.  HENT:     Head: Normocephalic and atraumatic.  Eyes:     Pupils: Pupils are equal, round, and reactive to light.  Cardiovascular:     Rate and Rhythm: Normal rate and regular rhythm.     Heart sounds: Normal heart sounds.  Pulmonary:     Effort: Pulmonary effort is normal.     Breath sounds: Normal breath sounds.  Abdominal:     General: Bowel sounds are normal.     Palpations: Abdomen is soft.  Musculoskeletal:         General: No tenderness or deformity. Normal range of motion.     Cervical back: Normal range of motion.  Lymphadenopathy:     Cervical: No cervical adenopathy.  Skin:    General: Skin is warm and dry.     Findings: No erythema or rash.  Neurological:     Mental Status: She is alert and oriented to person, place, and time.  Psychiatric:        Behavior: Behavior normal.        Thought Content: Thought content normal.        Judgment: Judgment normal.      Lab Results  Component Value Date   WBC 8.6 06/11/2022   HGB 12.3 06/11/2022   HCT 37.7 06/11/2022   MCV 77.1 (L) 06/11/2022   PLT 421 (H) 06/11/2022     Chemistry      Component Value Date/Time   NA 139 04/30/2022 0806   K 4.3 04/30/2022 0806   CL 105 04/30/2022 0806   CO2 28 04/30/2022 0806   BUN 10 04/30/2022 0806   CREATININE 0.67 04/30/2022 0806   CREATININE 0.69 12/06/2013 1631      Component Value Date/Time   CALCIUM 9.6 04/30/2022 0806   ALKPHOS 87 04/30/2022 0806   AST 12 (L) 04/30/2022 0806   ALT 9 04/30/2022 0806   BILITOT 0.3 04/30/2022 0806      Impression and Plan: [name] is a very charming 36 year old Afro-American female.  She is a whole lot of fun to talk to.  She has a very interesting job.  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  Her hemoglobin continues to improve.  The MCV continues to go up.  She will always have a low MCV because of the sickle cell trait.  She is on folic acid for this.  We will still follow her along.  Hopefully, we can now try to move her out to 2 months.     Volanda Napoleon, MD 6/15/202310:27 AM

## 2022-07-09 ENCOUNTER — Other Ambulatory Visit: Payer: Self-pay | Admitting: Nurse Practitioner

## 2022-07-09 ENCOUNTER — Other Ambulatory Visit (HOSPITAL_COMMUNITY)
Admission: RE | Admit: 2022-07-09 | Discharge: 2022-07-09 | Disposition: A | Discharge status: Home / Self Care | Payer: BC Managed Care – PPO | Source: Ambulatory Visit | Attending: Nurse Practitioner | Admitting: Nurse Practitioner

## 2022-07-09 DIAGNOSIS — Z124 Encounter for screening for malignant neoplasm of cervix: Secondary | ICD-10-CM | POA: Insufficient documentation

## 2022-07-16 LAB — CYTOLOGY - PAP
Comment: NEGATIVE
Diagnosis: NEGATIVE
High risk HPV: NEGATIVE

## 2022-08-13 ENCOUNTER — Encounter: Payer: Self-pay | Admitting: Hematology & Oncology

## 2022-08-13 ENCOUNTER — Inpatient Hospital Stay: Payer: BC Managed Care – PPO | Attending: Hematology & Oncology

## 2022-08-13 ENCOUNTER — Other Ambulatory Visit: Payer: Self-pay

## 2022-08-13 ENCOUNTER — Telehealth: Payer: Self-pay

## 2022-08-13 ENCOUNTER — Inpatient Hospital Stay (HOSPITAL_BASED_OUTPATIENT_CLINIC_OR_DEPARTMENT_OTHER): Payer: BC Managed Care – PPO | Admitting: Hematology & Oncology

## 2022-08-13 VITALS — BP 142/95 | HR 78 | Temp 98.2°F | Resp 18 | Ht <= 58 in | Wt 163.0 lb

## 2022-08-13 DIAGNOSIS — N921 Excessive and frequent menstruation with irregular cycle: Secondary | ICD-10-CM | POA: Insufficient documentation

## 2022-08-13 DIAGNOSIS — D573 Sickle-cell trait: Secondary | ICD-10-CM | POA: Insufficient documentation

## 2022-08-13 DIAGNOSIS — D500 Iron deficiency anemia secondary to blood loss (chronic): Secondary | ICD-10-CM

## 2022-08-13 LAB — IRON AND IRON BINDING CAPACITY (CC-WL,HP ONLY)
Iron: 55 ug/dL (ref 28–170)
Saturation Ratios: 16 % (ref 10.4–31.8)
TIBC: 343 ug/dL (ref 250–450)
UIBC: 288 ug/dL (ref 148–442)

## 2022-08-13 LAB — CBC WITH DIFFERENTIAL (CANCER CENTER ONLY)
Abs Immature Granulocytes: 0.02 10*3/uL (ref 0.00–0.07)
Basophils Absolute: 0 10*3/uL (ref 0.0–0.1)
Basophils Relative: 0 %
Eosinophils Absolute: 0.2 10*3/uL (ref 0.0–0.5)
Eosinophils Relative: 3 %
HCT: 36 % (ref 36.0–46.0)
Hemoglobin: 11.9 g/dL — ABNORMAL LOW (ref 12.0–15.0)
Immature Granulocytes: 0 %
Lymphocytes Relative: 23 %
Lymphs Abs: 1.4 10*3/uL (ref 0.7–4.0)
MCH: 26.4 pg (ref 26.0–34.0)
MCHC: 33.1 g/dL (ref 30.0–36.0)
MCV: 80 fL (ref 80.0–100.0)
Monocytes Absolute: 0.3 10*3/uL (ref 0.1–1.0)
Monocytes Relative: 5 %
Neutro Abs: 4.3 10*3/uL (ref 1.7–7.7)
Neutrophils Relative %: 69 %
Platelet Count: 413 10*3/uL — ABNORMAL HIGH (ref 150–400)
RBC: 4.5 MIL/uL (ref 3.87–5.11)
RDW: 14.8 % (ref 11.5–15.5)
WBC Count: 6.2 10*3/uL (ref 4.0–10.5)
nRBC: 0 % (ref 0.0–0.2)

## 2022-08-13 LAB — CMP (CANCER CENTER ONLY)
ALT: 9 U/L (ref 0–44)
AST: 12 U/L — ABNORMAL LOW (ref 15–41)
Albumin: 4.2 g/dL (ref 3.5–5.0)
Alkaline Phosphatase: 80 U/L (ref 38–126)
Anion gap: 8 (ref 5–15)
BUN: 7 mg/dL (ref 6–20)
CO2: 25 mmol/L (ref 22–32)
Calcium: 9 mg/dL (ref 8.9–10.3)
Chloride: 104 mmol/L (ref 98–111)
Creatinine: 0.65 mg/dL (ref 0.44–1.00)
GFR, Estimated: >60 mL/min (ref >60)
Glucose, Bld: 89 mg/dL (ref 70–99)
Potassium: 4.1 mmol/L (ref 3.5–5.1)
Sodium: 137 mmol/L (ref 135–145)
Total Bilirubin: 0.4 mg/dL (ref 0.3–1.2)
Total Protein: 7.8 g/dL (ref 6.5–8.1)

## 2022-08-13 LAB — RETICULOCYTES
Immature Retic Fract: 14.2 % (ref 2.3–15.9)
RBC.: 4.49 MIL/uL (ref 3.87–5.11)
Retic Count, Absolute: 51.2 10*3/uL (ref 19.0–186.0)
Retic Ct Pct: 1.1 % (ref 0.4–3.1)

## 2022-08-13 LAB — FERRITIN: Ferritin: 108 ng/mL (ref 11–307)

## 2022-08-13 NOTE — Telephone Encounter (Signed)
-----   Message from Volanda Napoleon, MD sent at 08/13/2022  1:12 PM EDT ----- Call her and let her know that the iron level is borderline low.  We will give her dose of IV iron.  This is made her feel better before.  Please set this up.  Laurey Arrow

## 2022-08-13 NOTE — Progress Notes (Signed)
Hematology and Oncology Follow Up Visit  Kimberly Guerra [id number] 06/08/1986 36 y.o. 08/13/2022   Principle Diagnosis:  Iron deficiency anemia-menometrorrhagia Sickle cell trait  Current Therapy:   IV iron-Venofer given on 83/38/2505 Folic acid 2 mg p.o. daily     Interim History:  Kimberly Guerra is back for follow-up.  She is not feeling as well today.  She is having some arthralgias.  She just feels more tired.  She last got iron 3 months ago.  Is certainly possible that she may need to have another dose of iron.  When we last saw her back in June, her iron saturation was 13%.  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  She still has her heavy monthly cycles.  I am unsure her gynecologist can help with this.  She has had no change in bowel or bladder habits.  She has had no nausea or vomiting.  She does have sickle cell trait.  She is on folic acid for this.  There is been no problems with leg swelling.  She has had no fever.  She has had no cough.  Overall, I would say her performance status is probably ECOG 1.    Medications:  Current Outpatient Medications:    albuterol (VENTOLIN HFA) 108 (90 Base) MCG/ACT inhaler, Inhale 2 puffs into the lungs every 6 (six) hours as needed for wheezing or shortness of breath., Disp: 18 g, Rfl: 1   cyclobenzaprine (FLEXERIL) 10 MG tablet, Take 1 tablet (10 mg total) by mouth 2 (two) times daily as needed for muscle spasms., Disp: 20 tablet, Rfl: 0   famotidine (PEPCID) 40 MG tablet, Take 40 mg by mouth at bedtime., Disp: , Rfl:    fluticasone (FLONASE) 50 MCG/ACT nasal spray, Place 2 sprays into both nostrils daily., Disp: 16 g, Rfl: 0   folic acid (FOLVITE) 1 MG tablet, Take 2 tablets (2 mg total) by mouth daily., Disp: 180 tablet, Rfl: 6   metoprolol tartrate (LOPRESSOR) 50 MG tablet, Take 1 tablet (50 mg total) by mouth 2 (two) times daily., Disp: 180 tablet, Rfl: 3   Multiple Vitamins-Minerals (WOMENS MULTIVITAMIN PLUS)  TABS, Take by mouth daily., Disp: , Rfl:    RABEprazole (ACIPHEX) 20 MG tablet, Take 2 tablets (40 mg total) by mouth 2 (two) times daily., Disp: 120 tablet, Rfl: 2   eletriptan (RELPAX) 20 MG tablet, Take 1 tablet (20 mg total) by mouth once for 1 dose. May repeat in 2 hours if headache persists or recurs., Disp: 12 tablet, Rfl: 0   losartan (COZAAR) 25 MG tablet, Take 1 tablet (25 mg total) by mouth daily., Disp: 90 tablet, Rfl: 1  Allergies:  Allergies  Allergen Reactions   Topamax [Topiramate] Palpitations    Heart palpitations    Past Medical History, Surgical history, Social history, and Family History were reviewed and updated.  Review of Systems: Review of Systems  Constitutional: Negative.   HENT:  Negative.    Eyes: Negative.   Respiratory: Negative.    Cardiovascular: Negative.   Gastrointestinal: Negative.   Endocrine: Negative.   Genitourinary: Negative.    Musculoskeletal: Negative.   Skin: Negative.   Neurological: Negative.   Hematological: Negative.   Psychiatric/Behavioral: Negative.      Physical Exam:  height is '4\' 10"'$  (1.473 m) and weight is 163 lb (73.9 kg). Her oral temperature is 98.2 F (36.8 C). Her blood pressure is 142/95 (abnormal) and her pulse is 78. Her respiration is 18  and oxygen saturation is 100%.   Wt Readings from Last 3 Encounters:  08/13/22 163 lb (73.9 kg)  06/11/22 162 lb 12.8 oz (73.8 kg)  04/30/22 163 lb (73.9 kg)    Physical Exam Vitals reviewed.  HENT:     Head: Normocephalic and atraumatic.  Eyes:     Pupils: Pupils are equal, round, and reactive to light.  Cardiovascular:     Rate and Rhythm: Normal rate and regular rhythm.     Heart sounds: Normal heart sounds.  Pulmonary:     Effort: Pulmonary effort is normal.     Breath sounds: Normal breath sounds.  Abdominal:     General: Bowel sounds are normal.     Palpations: Abdomen is soft.  Musculoskeletal:        General: No tenderness or deformity. Normal range of  motion.     Cervical back: Normal range of motion.  Lymphadenopathy:     Cervical: No cervical adenopathy.  Skin:    General: Skin is warm and dry.     Findings: No erythema or rash.  Neurological:     Mental Status: She is alert and oriented to person, place, and time.  Psychiatric:        Behavior: Behavior normal.        Thought Content: Thought content normal.        Judgment: Judgment normal.      Lab Results  Component Value Date   WBC 6.2 08/13/2022   HGB 11.9 (L) 08/13/2022   HCT 36.0 08/13/2022   MCV 80.0 08/13/2022   PLT 413 (H) 08/13/2022     Chemistry      Component Value Date/Time   NA 137 08/13/2022 0928   K 4.1 08/13/2022 0928   CL 104 08/13/2022 0928   CO2 25 08/13/2022 0928   BUN 7 08/13/2022 0928   CREATININE 0.65 08/13/2022 0928   CREATININE 0.69 12/06/2013 1631      Component Value Date/Time   CALCIUM 9.0 08/13/2022 0928   ALKPHOS 80 08/13/2022 0928   AST 12 (L) 08/13/2022 0928   ALT 9 08/13/2022 0928   BILITOT 0.4 08/13/2022 0928      Impression and Plan: Kimberly Guerra is a very charming 36 year old Afro-American female.  She is a whole lot of fun to talk to.  I really enjoy coming up with automobile "puns" on her.  She really enjoys these.  Again, we will see what her iron levels are.  Her MCV has come up a little bit so I would like to think that the iron might be okay.  However, given her symptoms, the iron might still be on the low side.  We will have the results back in a day or so.  We will plan to see her back myself in 2 months.    Volanda Napoleon, MD 8/17/202310:34 AM

## 2022-08-14 ENCOUNTER — Telehealth: Payer: Self-pay | Admitting: Hematology & Oncology

## 2022-08-14 NOTE — Telephone Encounter (Signed)
Called to schedule dose of iron IV per 8/17 sch msg , left voicemail

## 2022-08-20 ENCOUNTER — Inpatient Hospital Stay: Payer: BC Managed Care – PPO

## 2022-08-20 ENCOUNTER — Telehealth: Payer: Self-pay | Admitting: *Deleted

## 2022-08-20 VITALS — BP 129/80 | HR 81 | Temp 98.5°F | Resp 18

## 2022-08-20 DIAGNOSIS — N921 Excessive and frequent menstruation with irregular cycle: Secondary | ICD-10-CM

## 2022-08-20 DIAGNOSIS — D500 Iron deficiency anemia secondary to blood loss (chronic): Secondary | ICD-10-CM | POA: Diagnosis not present

## 2022-08-20 MED ORDER — SODIUM CHLORIDE 0.9 % IV SOLN
300.0000 mg | Freq: Once | INTRAVENOUS | Status: AC
  Administered 2022-08-20: 300 mg via INTRAVENOUS
  Filled 2022-08-20: qty 10

## 2022-08-20 MED ORDER — SODIUM CHLORIDE 0.9 % IV SOLN: Freq: Once | INTRAVENOUS | Status: AC

## 2022-08-20 NOTE — Telephone Encounter (Signed)
Per scheduling message Tiffany - called and lvm for callback to schedule (1) dose of IV Iron

## 2022-08-20 NOTE — Patient Instructions (Signed)

## 2022-10-15 ENCOUNTER — Other Ambulatory Visit: Payer: Self-pay

## 2022-10-15 ENCOUNTER — Inpatient Hospital Stay: Payer: BC Managed Care – PPO | Attending: Hematology & Oncology

## 2022-10-15 ENCOUNTER — Inpatient Hospital Stay (HOSPITAL_BASED_OUTPATIENT_CLINIC_OR_DEPARTMENT_OTHER): Payer: BC Managed Care – PPO | Admitting: Hematology & Oncology

## 2022-10-15 ENCOUNTER — Encounter: Payer: Self-pay | Admitting: Hematology & Oncology

## 2022-10-15 ENCOUNTER — Encounter: Payer: Self-pay | Admitting: *Deleted

## 2022-10-15 VITALS — BP 128/86 | HR 76 | Temp 98.5°F | Resp 18 | Ht <= 58 in | Wt 167.0 lb

## 2022-10-15 DIAGNOSIS — D573 Sickle-cell trait: Secondary | ICD-10-CM

## 2022-10-15 DIAGNOSIS — N921 Excessive and frequent menstruation with irregular cycle: Secondary | ICD-10-CM | POA: Diagnosis present

## 2022-10-15 DIAGNOSIS — D500 Iron deficiency anemia secondary to blood loss (chronic): Secondary | ICD-10-CM | POA: Insufficient documentation

## 2022-10-15 LAB — CMP (CANCER CENTER ONLY)
ALT: 10 U/L (ref 0–44)
AST: 12 U/L — ABNORMAL LOW (ref 15–41)
Albumin: 4.3 g/dL (ref 3.5–5.0)
Alkaline Phosphatase: 92 U/L (ref 38–126)
Anion gap: 8 (ref 5–15)
BUN: 9 mg/dL (ref 6–20)
CO2: 26 mmol/L (ref 22–32)
Calcium: 9.7 mg/dL (ref 8.9–10.3)
Chloride: 104 mmol/L (ref 98–111)
Creatinine: 0.71 mg/dL (ref 0.44–1.00)
GFR, Estimated: >60 mL/min (ref >60)
Glucose, Bld: 94 mg/dL (ref 70–99)
Potassium: 4.1 mmol/L (ref 3.5–5.1)
Sodium: 138 mmol/L (ref 135–145)
Total Bilirubin: 0.3 mg/dL (ref 0.3–1.2)
Total Protein: 7.8 g/dL (ref 6.5–8.1)

## 2022-10-15 LAB — CBC WITH DIFFERENTIAL (CANCER CENTER ONLY)
Abs Immature Granulocytes: 0.1 10*3/uL — ABNORMAL HIGH (ref 0.00–0.07)
Basophils Absolute: 0 10*3/uL (ref 0.0–0.1)
Basophils Relative: 0 %
Eosinophils Absolute: 0.1 10*3/uL (ref 0.0–0.5)
Eosinophils Relative: 1 %
HCT: 37.5 % (ref 36.0–46.0)
Hemoglobin: 12.2 g/dL (ref 12.0–15.0)
Immature Granulocytes: 1 %
Lymphocytes Relative: 20 %
Lymphs Abs: 1.6 10*3/uL (ref 0.7–4.0)
MCH: 26.3 pg (ref 26.0–34.0)
MCHC: 32.5 g/dL (ref 30.0–36.0)
MCV: 80.8 fL (ref 80.0–100.0)
Monocytes Absolute: 0.6 10*3/uL (ref 0.1–1.0)
Monocytes Relative: 7 %
Neutro Abs: 5.7 10*3/uL (ref 1.7–7.7)
Neutrophils Relative %: 71 %
Platelet Count: 446 10*3/uL — ABNORMAL HIGH (ref 150–400)
RBC: 4.64 MIL/uL (ref 3.87–5.11)
RDW: 14.3 % (ref 11.5–15.5)
WBC Count: 8.1 10*3/uL (ref 4.0–10.5)
nRBC: 0 % (ref 0.0–0.2)

## 2022-10-15 LAB — IRON AND IRON BINDING CAPACITY (CC-WL,HP ONLY)
Iron: 45 ug/dL (ref 28–170)
Saturation Ratios: 12 % (ref 10.4–31.8)
TIBC: 368 ug/dL (ref 250–450)
UIBC: 323 ug/dL (ref 148–442)

## 2022-10-15 LAB — RETICULOCYTES
Immature Retic Fract: 15.4 % (ref 2.3–15.9)
RBC.: 4.62 MIL/uL (ref 3.87–5.11)
Retic Count, Absolute: 79.9 10*3/uL (ref 19.0–186.0)
Retic Ct Pct: 1.7 % (ref 0.4–3.1)

## 2022-10-15 LAB — FERRITIN: Ferritin: 146 ng/mL (ref 11–307)

## 2022-10-15 NOTE — Progress Notes (Signed)
Hematology and Oncology Follow Up Visit  Kimberly Guerra [id number] [birth year] 36 y.o. 10/15/2022   Principle Diagnosis:  Iron deficiency anemia-menometrorrhagia Sickle cell trait  Current Therapy:   IV iron-Venofer given on 4/40/1027  Folic acid 2 mg p.o. daily     Interim History:  Kimberly Guerra is back for follow-up.  So far, she is doing okay.  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  She is not sleeping all that well.  I told her to try some melatonin to see if this may help.  She is having some bruising.  I am not sure exactly why this would be the case.  She does take some nonsteroidals for headaches.  This could be the etiology.  However, the nonsteroidals do seem to help her headaches.  Her last iron studies that were done back in August showed a ferritin of 108 with an iron saturation of 16%.  We did give her a dose of IV iron in August.  She does have the heavy monthly cycles.  She is taking the folic acid for her sickle cell trait.  She has had no obvious change in bowel or bladder habits.  She has had no leg swelling.  She has had no fever.  She has been avoiding COVID which is nice.  Overall, I would say her performance status is probably ECOG 1.   Medications:  Current Outpatient Medications:    albuterol (VENTOLIN HFA) 108 (90 Base) MCG/ACT inhaler, Inhale 2 puffs into the lungs every 6 (six) hours as needed for wheezing or shortness of breath., Disp: 18 g, Rfl: 1   cyclobenzaprine (FLEXERIL) 10 MG tablet, Take 1 tablet (10 mg total) by mouth 2 (two) times daily as needed for muscle spasms., Disp: 20 tablet, Rfl: 0   eletriptan (RELPAX) 20 MG tablet, Take 1 tablet (20 mg total) by mouth once for 1 dose. May repeat in 2 hours if headache persists or recurs., Disp: 12 tablet, Rfl: 0   famotidine (PEPCID) 40 MG tablet, Take 40 mg by mouth at bedtime., Disp: , Rfl:    fluticasone (FLONASE) 50 MCG/ACT nasal spray, Place 2  sprays into both nostrils daily., Disp: 16 g, Rfl: 0   folic acid (FOLVITE) 1 MG tablet, Take 2 tablets (2 mg total) by mouth daily., Disp: 180 tablet, Rfl: 6   losartan (COZAAR) 25 MG tablet, Take 1 tablet (25 mg total) by mouth daily., Disp: 90 tablet, Rfl: 1   metoprolol tartrate (LOPRESSOR) 50 MG tablet, Take 1 tablet (50 mg total) by mouth 2 (two) times daily., Disp: 180 tablet, Rfl: 3   Multiple Vitamins-Minerals (WOMENS MULTIVITAMIN PLUS) TABS, Take by mouth daily., Disp: , Rfl:    RABEprazole (ACIPHEX) 20 MG tablet, Take 2 tablets (40 mg total) by mouth 2 (two) times daily., Disp: 120 tablet, Rfl: 2  Allergies:  Allergies  Allergen Reactions   Topamax [Topiramate] Palpitations    Heart palpitations    Past Medical History, Surgical history, Social history, and Family History were reviewed and updated.  Review of Systems: Review of Systems  Constitutional: Negative.   HENT:  Negative.    Eyes: Negative.   Respiratory: Negative.    Cardiovascular: Negative.   Gastrointestinal: Negative.   Endocrine: Negative.   Genitourinary: Negative.    Musculoskeletal: Negative.   Skin: Negative.   Neurological: Negative.   Hematological: Negative.   Psychiatric/Behavioral: Negative.      Physical Exam:  height is '4\' 10"'$  (1.473 m)  and weight is 167 lb (75.8 kg). Her oral temperature is 98.5 F (36.9 C). Her blood pressure is 128/86 and her pulse is 76. Her respiration is 18 and oxygen saturation is 96%.   Wt Readings from Last 3 Encounters:  10/15/22 167 lb (75.8 kg)  08/13/22 163 lb (73.9 kg)  06/11/22 162 lb 12.8 oz (73.8 kg)    Physical Exam Vitals reviewed.  HENT:     Head: Normocephalic and atraumatic.  Eyes:     Pupils: Pupils are equal, round, and reactive to light.  Cardiovascular:     Rate and Rhythm: Normal rate and regular rhythm.     Heart sounds: Normal heart sounds.  Pulmonary:     Effort: Pulmonary effort is normal.     Breath sounds: Normal breath sounds.   Abdominal:     General: Bowel sounds are normal.     Palpations: Abdomen is soft.  Musculoskeletal:        General: No tenderness or deformity. Normal range of motion.     Cervical back: Normal range of motion.  Lymphadenopathy:     Cervical: No cervical adenopathy.  Skin:    General: Skin is warm and dry.     Findings: No erythema or rash.  Neurological:     Mental Status: She is alert and oriented to person, place, and time.  Psychiatric:        Behavior: Behavior normal.        Thought Content: Thought content normal.        Judgment: Judgment normal.      Lab Results  Component Value Date   WBC 8.1 10/15/2022   HGB 12.2 10/15/2022   HCT 37.5 10/15/2022   MCV 80.8 10/15/2022   PLT 446 (H) 10/15/2022     Chemistry      Component Value Date/Time   NA 137 08/13/2022 0928   K 4.1 08/13/2022 0928   CL 104 08/13/2022 0928   CO2 25 08/13/2022 0928   BUN 7 08/13/2022 0928   CREATININE 0.65 08/13/2022 0928   CREATININE 0.69 12/06/2013 1631      Component Value Date/Time   CALCIUM 9.0 08/13/2022 0928   ALKPHOS 80 08/13/2022 0928   AST 12 (L) 08/13/2022 0928   ALT 9 08/13/2022 0928   BILITOT 0.4 08/13/2022 0928      Impression and Plan: Kimberly Guerra is a very charming 36 year old Afro-American female.  She is a whole lot of fun to talk to.  Her hemoglobin continues to improve.  Hopefully, we will see that her iron studies are okay.  Not sure why she has the bruising.  Again I have to believe this might be from the nonsteroidals that she takes.  Hopefully, melatonin will help with her headaches.  I would like to get her back after the Holidays.  I think we can do this.  We will see what her iron studies look like.  Marland Kitchen    Volanda Napoleon, MD 10/19/20238:40 AM

## 2022-10-19 ENCOUNTER — Encounter: Payer: BC Managed Care – PPO | Admitting: Nurse Practitioner

## 2022-10-22 ENCOUNTER — Inpatient Hospital Stay: Payer: BC Managed Care – PPO

## 2022-10-22 VITALS — BP 126/78 | HR 83 | Temp 98.3°F | Resp 18

## 2022-10-22 DIAGNOSIS — N921 Excessive and frequent menstruation with irregular cycle: Secondary | ICD-10-CM

## 2022-10-22 DIAGNOSIS — D500 Iron deficiency anemia secondary to blood loss (chronic): Secondary | ICD-10-CM | POA: Diagnosis not present

## 2022-10-22 MED ORDER — SODIUM CHLORIDE 0.9 % IV SOLN
300.0000 mg | Freq: Once | INTRAVENOUS | Status: AC
  Administered 2022-10-22: 300 mg via INTRAVENOUS
  Filled 2022-10-22: qty 300

## 2022-10-22 MED ORDER — SODIUM CHLORIDE 0.9 % IV SOLN: Freq: Once | INTRAVENOUS | Status: AC

## 2022-10-22 NOTE — Patient Instructions (Signed)

## 2022-12-01 ENCOUNTER — Encounter: Payer: BC Managed Care – PPO | Admitting: Nurse Practitioner

## 2022-12-23 ENCOUNTER — Telehealth: Payer: Self-pay | Admitting: Nurse Practitioner

## 2022-12-23 NOTE — Telephone Encounter (Signed)
Pt needs appt to get medication refills. Pt hasn't been seen since February

## 2022-12-23 NOTE — Telephone Encounter (Signed)
Caller Name: Nitasha Jewel Call back phone #: [phone]   MEDICATION(S):  Lopressor  Days of Med Remaining:   Has the patient contacted their pharmacy (YES/NO)? Yes, advised to contact PCP What did pharmacy advise?   Preferred Pharmacy:  [id number] [place] [address] [id number]: [phone] Fax: [phone]  ~~~Please advise patient/caregiver to allow 2-3 business days to process RX refills.

## 2023-01-07 ENCOUNTER — Encounter: Payer: Self-pay | Admitting: *Deleted

## 2023-01-07 ENCOUNTER — Inpatient Hospital Stay (HOSPITAL_BASED_OUTPATIENT_CLINIC_OR_DEPARTMENT_OTHER): Payer: BC Managed Care – PPO | Admitting: Medical Oncology

## 2023-01-07 ENCOUNTER — Inpatient Hospital Stay: Payer: BC Managed Care – PPO | Attending: Hematology & Oncology

## 2023-01-07 ENCOUNTER — Other Ambulatory Visit: Payer: Self-pay

## 2023-01-07 ENCOUNTER — Encounter: Payer: Self-pay | Admitting: Medical Oncology

## 2023-01-07 VITALS — BP 116/79 | HR 77 | Temp 98.5°F | Resp 18 | Ht <= 58 in | Wt 165.0 lb

## 2023-01-07 DIAGNOSIS — M2550 Pain in unspecified joint: Secondary | ICD-10-CM

## 2023-01-07 DIAGNOSIS — D500 Iron deficiency anemia secondary to blood loss (chronic): Secondary | ICD-10-CM

## 2023-01-07 DIAGNOSIS — N921 Excessive and frequent menstruation with irregular cycle: Secondary | ICD-10-CM | POA: Diagnosis present

## 2023-01-07 DIAGNOSIS — R5383 Other fatigue: Secondary | ICD-10-CM | POA: Diagnosis not present

## 2023-01-07 DIAGNOSIS — D573 Sickle-cell trait: Secondary | ICD-10-CM

## 2023-01-07 DIAGNOSIS — R519 Headache, unspecified: Secondary | ICD-10-CM | POA: Diagnosis not present

## 2023-01-07 LAB — IRON AND IRON BINDING CAPACITY (CC-WL,HP ONLY)
Iron: 65 ug/dL (ref 28–170)
Saturation Ratios: 19 % (ref 10.4–31.8)
TIBC: 343 ug/dL (ref 250–450)
UIBC: 278 ug/dL (ref 148–442)

## 2023-01-07 LAB — CBC WITH DIFFERENTIAL (CANCER CENTER ONLY)
Abs Immature Granulocytes: 0.03 10*3/uL (ref 0.00–0.07)
Basophils Absolute: 0 10*3/uL (ref 0.0–0.1)
Basophils Relative: 1 %
Eosinophils Absolute: 0.2 10*3/uL (ref 0.0–0.5)
Eosinophils Relative: 2 %
HCT: 39.1 % (ref 36.0–46.0)
Hemoglobin: 12.8 g/dL (ref 12.0–15.0)
Immature Granulocytes: 0 %
Lymphocytes Relative: 22 %
Lymphs Abs: 1.6 10*3/uL (ref 0.7–4.0)
MCH: 26.9 pg (ref 26.0–34.0)
MCHC: 32.7 g/dL (ref 30.0–36.0)
MCV: 82.3 fL (ref 80.0–100.0)
Monocytes Absolute: 0.5 10*3/uL (ref 0.1–1.0)
Monocytes Relative: 7 %
Neutro Abs: 4.9 10*3/uL (ref 1.7–7.7)
Neutrophils Relative %: 68 %
Platelet Count: 431 10*3/uL — ABNORMAL HIGH (ref 150–400)
RBC: 4.75 MIL/uL (ref 3.87–5.11)
RDW: 14.3 % (ref 11.5–15.5)
WBC Count: 7.3 10*3/uL (ref 4.0–10.5)
nRBC: 0 % (ref 0.0–0.2)

## 2023-01-07 LAB — CMP (CANCER CENTER ONLY)
ALT: 11 U/L (ref 0–44)
AST: 14 U/L — ABNORMAL LOW (ref 15–41)
Albumin: 4.4 g/dL (ref 3.5–5.0)
Alkaline Phosphatase: 80 U/L (ref 38–126)
Anion gap: 9 (ref 5–15)
BUN: 9 mg/dL (ref 6–20)
CO2: 27 mmol/L (ref 22–32)
Calcium: 9.7 mg/dL (ref 8.9–10.3)
Chloride: 103 mmol/L (ref 98–111)
Creatinine: 0.64 mg/dL (ref 0.44–1.00)
GFR, Estimated: >60 mL/min (ref >60)
Glucose, Bld: 97 mg/dL (ref 70–99)
Potassium: 4.1 mmol/L (ref 3.5–5.1)
Sodium: 139 mmol/L (ref 135–145)
Total Bilirubin: 0.5 mg/dL (ref 0.3–1.2)
Total Protein: 7.9 g/dL (ref 6.5–8.1)

## 2023-01-07 LAB — TSH: TSH: 0.765 u[IU]/mL (ref 0.350–4.500)

## 2023-01-07 LAB — T4, FREE: Free T4: 0.89 ng/dL (ref 0.61–1.12)

## 2023-01-07 LAB — FERRITIN: Ferritin: 195 ng/mL (ref 11–307)

## 2023-01-07 LAB — LACTATE DEHYDROGENASE: LDH: 137 U/L (ref 98–192)

## 2023-01-07 LAB — VITAMIN D 25 HYDROXY (VIT D DEFICIENCY, FRACTURES): Vit D, 25-Hydroxy: <4.2 ng/mL — ABNORMAL LOW (ref 30–100)

## 2023-01-07 NOTE — Progress Notes (Signed)
Hematology and Oncology Follow Up Visit  Zehra Rucci [id number] 16-Jul-1986 37 y.o. 01/07/2023   Principle Diagnosis:  Iron deficiency anemia-menometrorrhagia Sickle cell trait  Current Therapy:   IV iron-Venofer given on 9/38/1017  Folic acid 2 mg p.o. daily     Interim History:  Ms. Daloia is back for follow-up  She reports that other than continued fatigue and chronic headaches she is doing well. These are not new are acutely worsened. She questions if her iron levels may be low. Also noting some generalized chronic aches and pains without joint swelling.   Cycles stable.   She is not taking oral iron. She is not having any bleeding or bruising. She is taking folic acid daily. No chest pain.   Overall, I would say her performance status is probably ECOG 1.   Medications:  Current Outpatient Medications:    valACYclovir (VALTREX) 1000 MG tablet, Take 1,000 mg by mouth., Disp: , Rfl:    albuterol (VENTOLIN HFA) 108 (90 Base) MCG/ACT inhaler, Inhale 2 puffs into the lungs every 6 (six) hours as needed for wheezing or shortness of breath. (Patient not taking: Reported on 10/22/2022), Disp: 18 g, Rfl: 1   cyclobenzaprine (FLEXERIL) 10 MG tablet, Take 1 tablet (10 mg total) by mouth 2 (two) times daily as needed for muscle spasms., Disp: 20 tablet, Rfl: 0   eletriptan (RELPAX) 20 MG tablet, Take 1 tablet (20 mg total) by mouth once for 1 dose. May repeat in 2 hours if headache persists or recurs., Disp: 12 tablet, Rfl: 0   famotidine (PEPCID) 40 MG tablet, Take 40 mg by mouth at bedtime., Disp: , Rfl:    fluticasone (FLONASE) 50 MCG/ACT nasal spray, Place 2 sprays into both nostrils daily., Disp: 16 g, Rfl: 0   folic acid (FOLVITE) 1 MG tablet, Take 2 tablets (2 mg total) by mouth daily., Disp: 180 tablet, Rfl: 6   losartan (COZAAR) 25 MG tablet, Take 1 tablet (25 mg total) by mouth daily., Disp: 90 tablet, Rfl: 1   metoprolol tartrate (LOPRESSOR) 50 MG tablet, Take 1 tablet  (50 mg total) by mouth 2 (two) times daily., Disp: 180 tablet, Rfl: 3   Multiple Vitamins-Minerals (WOMENS MULTIVITAMIN PLUS) TABS, Take by mouth daily., Disp: , Rfl:    RABEprazole (ACIPHEX) 20 MG tablet, Take 2 tablets (40 mg total) by mouth 2 (two) times daily., Disp: 120 tablet, Rfl: 2  Allergies:  Allergies  Allergen Reactions   Topamax [Topiramate] Palpitations    Heart palpitations    Past Medical History, Surgical history, Social history, and Family History were reviewed and updated.  Review of Systems: Review of Systems  Constitutional: Negative.   HENT:  Negative.    Eyes: Negative.   Respiratory: Negative.    Cardiovascular: Negative.   Gastrointestinal: Negative.   Endocrine: Negative.   Genitourinary: Negative.    Musculoskeletal: Negative.   Skin: Negative.   Neurological: Negative.   Hematological: Negative.   Psychiatric/Behavioral: Negative.      Physical Exam:  height is '4\' 10"'$  (1.473 m) and weight is 165 lb (74.8 kg). Her oral temperature is 98.5 F (36.9 C). Her blood pressure is 116/79 and her pulse is 77. Her respiration is 18 and oxygen saturation is 99%.   Wt Readings from Last 3 Encounters:  01/07/23 165 lb (74.8 kg)  10/15/22 167 lb (75.8 kg)  08/13/22 163 lb (73.9 kg)    Physical Exam Vitals reviewed.  HENT:     Head: Normocephalic and atraumatic.  Eyes:     Pupils: Pupils are equal, round, and reactive to light.  Cardiovascular:     Rate and Rhythm: Normal rate and regular rhythm.     Heart sounds: Normal heart sounds.  Pulmonary:     Effort: Pulmonary effort is normal.     Breath sounds: Normal breath sounds.  Abdominal:     General: Bowel sounds are normal.     Palpations: Abdomen is soft.  Musculoskeletal:        General: No tenderness or deformity. Normal range of motion.     Cervical back: Normal range of motion.  Lymphadenopathy:     Cervical: No cervical adenopathy.  Skin:    General: Skin is warm and dry.     Findings:  No erythema or rash.  Neurological:     Mental Status: She is alert and oriented to person, place, and time.  Psychiatric:        Behavior: Behavior normal.        Thought Content: Thought content normal.        Judgment: Judgment normal.      Lab Results  Component Value Date   WBC 7.3 01/07/2023   HGB 12.8 01/07/2023   HCT 39.1 01/07/2023   MCV 82.3 01/07/2023   PLT 431 (H) 01/07/2023     Chemistry      Component Value Date/Time   NA 139 01/07/2023 0937   K 4.1 01/07/2023 0937   CL 103 01/07/2023 0937   CO2 27 01/07/2023 0937   BUN 9 01/07/2023 0937   CREATININE 0.64 01/07/2023 0937   CREATININE 0.69 12/06/2013 1631      Component Value Date/Time   CALCIUM 9.7 01/07/2023 0937   ALKPHOS 80 01/07/2023 0937   AST 14 (L) 01/07/2023 0937   ALT 11 01/07/2023 0937   BILITOT 0.5 01/07/2023 0937      Impression and Plan: Ms. Smigel is a nice 37 year old Afro-American female.   Hemoglobin is 12.8 today and normocytic. Platelets still a bit elevated but improved. Bruising resolved after stopping her NSAIDs. Last ferritin was 146. LDH 137. Today iron storage labs pending at time of the visit.   Given her fatigue despite normal iron levels will also check a vitamin D level and thyroid panels. If normal she would benefit from rheumatological labs at follow up  Adding TSH, free T3/T4 and vitamin D level to labs from today RTC 3 months MD, labs (CBC w/, CMP, ferritin, iron/TIBC, reticulocyte, LDH, sed, CRP)  Holli Humbles [place], PA-C 1/11/202412:47 PM

## 2023-01-08 ENCOUNTER — Other Ambulatory Visit: Payer: Self-pay | Admitting: Medical Oncology

## 2023-01-08 LAB — T3, FREE: T3, Free: 2.9 pg/mL (ref 2.0–4.4)

## 2023-01-08 MED ORDER — ERGOCALCIFEROL 1.25 MG (50000 UT) PO CAPS: 50000.0000 [IU] | ORAL_CAPSULE | ORAL | 3 refills | Status: DC

## 2023-03-17 ENCOUNTER — Ambulatory Visit (INDEPENDENT_AMBULATORY_CARE_PROVIDER_SITE_OTHER): Payer: BC Managed Care – PPO | Admitting: Nurse Practitioner

## 2023-03-17 ENCOUNTER — Other Ambulatory Visit (HOSPITAL_COMMUNITY)
Admission: RE | Admit: 2023-03-17 | Discharge: 2023-03-17 | Disposition: A | Discharge status: Home / Self Care | Payer: BC Managed Care – PPO | Source: Ambulatory Visit | Attending: Nurse Practitioner | Admitting: Nurse Practitioner

## 2023-03-17 ENCOUNTER — Encounter: Payer: Self-pay | Admitting: Nurse Practitioner

## 2023-03-17 VITALS — BP 130/82 | HR 83 | Temp 98.2°F | Resp 16 | Ht <= 58 in | Wt 165.4 lb

## 2023-03-17 DIAGNOSIS — Z1159 Encounter for screening for other viral diseases: Secondary | ICD-10-CM | POA: Diagnosis not present

## 2023-03-17 DIAGNOSIS — Z13220 Encounter for screening for lipoid disorders: Secondary | ICD-10-CM | POA: Diagnosis not present

## 2023-03-17 DIAGNOSIS — R002 Palpitations: Secondary | ICD-10-CM

## 2023-03-17 DIAGNOSIS — Z136 Encounter for screening for cardiovascular disorders: Secondary | ICD-10-CM | POA: Diagnosis not present

## 2023-03-17 DIAGNOSIS — Z0001 Encounter for general adult medical examination with abnormal findings: Secondary | ICD-10-CM

## 2023-03-17 DIAGNOSIS — E559 Vitamin D deficiency, unspecified: Secondary | ICD-10-CM

## 2023-03-17 DIAGNOSIS — N912 Amenorrhea, unspecified: Secondary | ICD-10-CM | POA: Diagnosis not present

## 2023-03-17 DIAGNOSIS — I10 Essential (primary) hypertension: Secondary | ICD-10-CM

## 2023-03-17 DIAGNOSIS — G43709 Chronic migraine without aura, not intractable, without status migrainosus: Secondary | ICD-10-CM | POA: Diagnosis not present

## 2023-03-17 LAB — LIPID PANEL
Cholesterol: 173 mg/dL (ref 0–200)
HDL: 65.2 mg/dL (ref >39.00)
LDL Cholesterol: 95 mg/dL (ref 0–99)
NonHDL: 108.17
Total CHOL/HDL Ratio: 3
Triglycerides: 67 mg/dL (ref 0.0–149.0)
VLDL: 13.4 mg/dL (ref 0.0–40.0)

## 2023-03-17 LAB — POCT URINE PREGNANCY: Preg Test, Ur: NEGATIVE

## 2023-03-17 LAB — VITAMIN D 25 HYDROXY (VIT D DEFICIENCY, FRACTURES): VITD: 36.46 ng/mL (ref 30.00–100.00)

## 2023-03-17 MED ORDER — ELETRIPTAN HYDROBROMIDE 20 MG PO TABS: 20.0000 mg | ORAL_TABLET | Freq: Every day | ORAL | 0 refills | Status: DC | PRN

## 2023-03-17 MED ORDER — LOSARTAN POTASSIUM 25 MG PO TABS: 25.0000 mg | ORAL_TABLET | Freq: Every day | ORAL | 3 refills | Status: DC

## 2023-03-17 MED ORDER — METOPROLOL TARTRATE 50 MG PO TABS: 50.0000 mg | ORAL_TABLET | Freq: Two times a day (BID) | ORAL | 3 refills | Status: DC

## 2023-03-17 NOTE — Assessment & Plan Note (Addendum)
Migraine 2x/month, resolves with use of relpax prn Med refill sent

## 2023-03-17 NOTE — Assessment & Plan Note (Signed)
Chronic, describes as fluttering sensation, occurs randomly once a week, last about 69mins, associated with tightness in throat and sweaty palms. Unknown trigger Did not check rate Denies any dizziness or syncope or chest pain or SOB or anxiety Fhx of A-fib: mother diagnosed at age 36 No caffeine, social ETOH consumption, no tobacco use, no illicit drug use Recent labs 12/2022: normal cbc, iron panel, cmp, TSH, T4, T3.  Ordered cardiac even monitor

## 2023-03-17 NOTE — Assessment & Plan Note (Signed)
Improved fatigue with vit. D supplement Repeat vit. D today

## 2023-03-17 NOTE — Patient Instructions (Signed)
Go to lab Continue Heart healthy diet and daily exercise. Maintain current medications.  Preventive Care 21-36 Years Old, Female Preventive care refers to lifestyle choices and visits with your health care provider that can promote health and wellness. Preventive care visits are also called wellness exams. What can I expect for my preventive care visit? Counseling During your preventive care visit, your health care provider may ask about your: Medical history, including: Past medical problems. Family medical history. Pregnancy history. Current health, including: Menstrual cycle. Method of birth control. Emotional well-being. Home life and relationship well-being. Sexual activity and sexual health. Lifestyle, including: Alcohol, nicotine or tobacco, and drug use. Access to firearms. Diet, exercise, and sleep habits. Work and work environment. Sunscreen use. Safety issues such as seatbelt and bike helmet use. Physical exam Your health care provider may check your: Height and weight. These may be used to calculate your BMI (body mass index). BMI is a measurement that tells if you are at a healthy weight. Waist circumference. This measures the distance around your waistline. This measurement also tells if you are at a healthy weight and may help predict your risk of certain diseases, such as type 2 diabetes and high blood pressure. Heart rate and blood pressure. Body temperature. Skin for abnormal spots. What immunizations do I need?  Vaccines are usually given at various ages, according to a schedule. Your health care provider will recommend vaccines for you based on your age, medical history, and lifestyle or other factors, such as travel or where you work. What tests do I need? Screening Your health care provider may recommend screening tests for certain conditions. This may include: Pelvic exam and Pap test. Lipid and cholesterol levels. Diabetes screening. This is done by  checking your blood sugar (glucose) after you have not eaten for a while (fasting). Hepatitis B test. Hepatitis C test. HIV (human immunodeficiency virus) test. STI (sexually transmitted infection) testing, if you are at risk. BRCA-related cancer screening. This may be done if you have a family history of breast, ovarian, tubal, or peritoneal cancers. Talk with your health care provider about your test results, treatment options, and if necessary, the need for more tests. Follow these instructions at home: Eating and drinking  Eat a healthy diet that includes fresh fruits and vegetables, whole grains, lean protein, and low-fat dairy products. Take vitamin and mineral supplements as recommended by your health care provider. Do not drink alcohol if: Your health care provider tells you not to drink. You are pregnant, may be pregnant, or are planning to become pregnant. If you drink alcohol: Limit how much you have to 0-1 drink a day. Know how much alcohol is in your drink. In the [place] one drink equals one 12 oz bottle of beer (355 mL), one 5 oz glass of wine (148 mL), or one 1 oz glass of hard liquor (44 mL). Lifestyle Brush your teeth every morning and night with fluoride toothpaste. Floss one time each day. Exercise for at least 30 minutes 5 or more days each week. Do not use any products that contain nicotine or tobacco. These products include cigarettes, chewing tobacco, and vaping devices, such as e-cigarettes. If you need help quitting, ask your health care provider. Do not use drugs. If you are sexually active, practice safe sex. Use a condom or other form of protection to prevent STIs. If you do not wish to become pregnant, use a form of birth control. If you plan to become pregnant, see your health care   provider for a prepregnancy visit. Find healthy ways to manage stress, such as: Meditation, yoga, or listening to music. Journaling. Talking to a trusted person. Spending time  with friends and family. Minimize exposure to UV radiation to reduce your risk of skin cancer. Safety Always wear your seat belt while driving or riding in a vehicle. Do not drive: If you have been drinking alcohol. Do not ride with someone who has been drinking. If you have been using any mind-altering substances or drugs. While texting. When you are tired or distracted. Wear a helmet and other protective equipment during sports activities. If you have firearms in your house, make sure you follow all gun safety procedures. Seek help if you have been physically or sexually abused. What's next? Go to your health care provider once a year for an annual wellness visit. Ask your health care provider how often you should have your eyes and teeth checked. Stay up to date on all vaccines. This information is not intended to replace advice given to you by your health care provider. Make sure you discuss any questions you have with your health care provider. Document Revised: 06/11/2021 Document Reviewed: 06/11/2021 Elsevier Patient Education  2023 Elsevier Inc.  

## 2023-03-17 NOTE — Progress Notes (Unsigned)
Complete physical exam  Patient: Lilianah Stophel   DOB: April 01, 1986   37 y.o. Female  MRN: [id number] Visit Date: 03/18/2023  Subjective:    Chief Complaint  Patient presents with   Annual Exam    Fasting - yes   need refills - metropolol, albuterol and relpax    Teina Campana is a 37 y.o. female who presents today for a complete physical exam. She reports consuming a general diet.  Walking daily x74mins  She generally feels well. She reports sleeping well. She does have additional problems to discuss today.  Vision:Yes Dental:Yes STD Screen:Yes  BP Readings from Last 3 Encounters:  03/17/23 130/82  01/07/23 116/79  10/22/22 126/78   Wt Readings from Last 3 Encounters:  03/17/23 165 lb 6.4 oz (75 kg)  01/07/23 165 lb (74.8 kg)  10/15/22 167 lb (75.8 kg)   Most recent fall risk assessment:    03/17/2023    8:30 AM  [place] in the past year? 0  Number falls in past yr: 0  Injury with Fall? 0  Risk for fall due to : No Fall Risks  Follow up Falls evaluation completed   Depression screen:Yes - No Depression  Most recent depression screenings:    03/17/2023    8:30 AM 10/16/2021   10:04 AM  PHQ 2/9 Scores  PHQ - 2 Score 0 0  PHQ- 9 Score  3   HPI  She requested for urine pregnancy test due to unprotected intercourse.  Migraines Migraine 2x/month, resolves with use of relpax prn Med refill sent   Vitamin D deficiency Improved fatigue with vit. D supplement Repeat vit. D today  Intermittent palpitations Chronic, describes as fluttering sensation, occurs randomly once a week, last about 36mins, associated with tightness in throat and sweaty palms. Unknown trigger Did not check rate Denies any dizziness or syncope or chest pain or SOB or anxiety Fhx of A-fib: mother diagnosed at age 6 No caffeine, social ETOH consumption, no tobacco use, no illicit drug use Recent labs 12/2022: normal cbc, iron panel, cmp, TSH, T4, T3.  Ordered cardiac even  monitor   Past Medical History:  Diagnosis Date   Anemia    Asthma    prn inhaler   History of gestational hypertension    Irregular menstrual cycle    Menometrorrhagia 03/27/2022   Menorrhagia    Migraines    Seasonal allergies    Sickle cell trait ([facility])    Sickle cell trait ([place]) 06/11/2022   Past Surgical History:  Procedure Laterality Date   CARPAL TUNNEL RELEASE Right 03/25/2015   Procedure:  ENDOSCOPIC CARPAL TUNNEL RELEASE;  Surgeon: Milly Jakob, MD;  Location: [facility];  Service: Orthopedics;  Laterality: Right;   CESAREAN SECTION  01/02/2009   DORSAL COMPARTMENT RELEASE Right 03/25/2015   Procedure: RIGHT WRIST DEQUERVAIN RELEASE AND ;  Surgeon: Milly Jakob, MD;  Location: [facility];  Service: Orthopedics;  Laterality: Right;   HYSTEROSCOPY WITH D & C N/A 06/24/2017   Procedure: DILATATION AND CURETTAGE /HYSTEROSCOPY  pelvic biopsy and ablation of endometriosis;  Surgeon: Thurnell Lose, MD;  Location: [facility];  Service: Gynecology;  Laterality: N/A;   LAPAROSCOPY N/A 06/24/2017   Procedure: LAPAROSCOPY DIAGNOSTIC;  Surgeon: Thurnell Lose, MD;  Location: [facility];  Service: Gynecology;  Laterality: N/A;   WISDOM TOOTH EXTRACTION     Social History   Socioeconomic History   Marital status: Single  Spouse name: Not on file   Number of children: 1   Years of education: 82   Highest education level: Not on file  Occupational History   Occupation: Scientist, clinical (histocompatibility and immunogenetics): [employer]  Tobacco Use   Smoking status: Never   Smokeless tobacco: Never  Vaping Use   Vaping Use: Never used  Substance and Sexual Activity   Alcohol use: Yes    Alcohol/week: 3.0 standard drinks of alcohol    Types: 3 Glasses of wine per week   Drug use: No   Sexual activity: Yes    Birth control/protection: None  Other Topics Concern   Not on file  Social History Narrative   Lives with son (2010), employeed at [employer]  part-time. Completed some college classes.    Social Determinants of Health   Financial Resource Strain: Not on file  Food Insecurity: Not on file  Transportation Needs: Not on file  Physical Activity: Not on file  Stress: Not on file  Social Connections: Not on file  Intimate Partner Violence: Not on file   Family Status  Relation Name Status   Mother  Alive   Father  Alive   Brother  (Not Specified)   Ethlyn Daniels  (Not Specified)   MGF  (Not Specified)   Neg Hx  (Not Specified)   Family History  Problem Relation Age of Onset   Hypertension Mother    Asthma Father    Hypertension Father    Asthma Brother    Asthma Paternal Aunt    Diabetes Paternal Aunt    Cancer Maternal Grandfather        oral cancer   Diabetes Maternal Grandfather    Hypertension Maternal Grandfather    Colon cancer Neg Hx    Esophageal cancer Neg Hx    Rectal cancer Neg Hx    Stomach cancer Neg Hx    Allergies  Allergen Reactions   Topamax [Topiramate] Palpitations    Heart palpitations    Patient Care Team: Khalil Belote, Charlene Brooke, NP as PCP - General (Internal Medicine) Meisinger, Sherren Mocha, MD as Consulting Physician (Obstetrics and Gynecology)   Medications: Outpatient Medications Prior to Visit  Medication Sig   albuterol (VENTOLIN HFA) 108 (90 Base) MCG/ACT inhaler Inhale 2 puffs into the lungs every 6 (six) hours as needed for wheezing or shortness of breath.   cyclobenzaprine (FLEXERIL) 10 MG tablet Take 1 tablet (10 mg total) by mouth 2 (two) times daily as needed for muscle spasms.   ergocalciferol (VITAMIN D2) 1.25 MG (50000 UT) capsule Take 1 capsule (50,000 Units total) by mouth once a week.   famotidine (PEPCID) 40 MG tablet Take 40 mg by mouth at bedtime.   fluticasone (FLONASE) 50 MCG/ACT nasal spray Place 2 sprays into both nostrils daily.   folic acid (FOLVITE) 1 MG tablet Take 2 tablets (2 mg total) by mouth daily.   Multiple Vitamins-Minerals (WOMENS MULTIVITAMIN PLUS) TABS Take by  mouth daily.   valACYclovir (VALTREX) 1000 MG tablet Take 1,000 mg by mouth.   [DISCONTINUED] eletriptan (RELPAX) 20 MG tablet Take 1 tablet (20 mg total) by mouth once for 1 dose. May repeat in 2 hours if headache persists or recurs.   [DISCONTINUED] losartan (COZAAR) 25 MG tablet Take 1 tablet (25 mg total) by mouth daily.   [DISCONTINUED] metoprolol tartrate (LOPRESSOR) 50 MG tablet Take 1 tablet (50 mg total) by mouth 2 (two) times daily.   [DISCONTINUED] RABEprazole (ACIPHEX) 20 MG tablet Take 2 tablets (40 mg  total) by mouth 2 (two) times daily. (Patient not taking: Reported on 03/17/2023)   No facility-administered medications prior to visit.    Review of Systems  Constitutional:  Negative for activity change, appetite change and unexpected weight change.  Respiratory: Negative.    Cardiovascular: Negative.   Gastrointestinal: Negative.   Endocrine: Negative for cold intolerance and heat intolerance.  Genitourinary: Negative.   Musculoskeletal: Negative.   Skin: Negative.   Neurological: Negative.   Hematological: Negative.   Psychiatric/Behavioral:  Negative for behavioral problems, decreased concentration, dysphoric mood, hallucinations, self-injury, sleep disturbance and suicidal ideas. The patient is not nervous/anxious.         Objective:  BP 130/82 (BP Location: Left Arm, Patient Position: Sitting, Cuff Size: Large)   Pulse 83   Temp 98.2 F (36.8 C) (Temporal)   Resp 16   Ht 4\' 10"  (1.473 m)   Wt 165 lb 6.4 oz (75 kg)   LMP 02/23/2023 (Exact Date)   SpO2 97%   BMI 34.57 kg/m     Physical Exam Vitals and nursing note reviewed.  Constitutional:      General: She is not in acute distress. HENT:     Right Ear: Tympanic membrane, ear canal and external ear normal.     Left Ear: Tympanic membrane, ear canal and external ear normal.     Nose: Nose normal.  Eyes:     Extraocular Movements: Extraocular movements intact.     Conjunctiva/sclera: Conjunctivae  normal.     Pupils: Pupils are equal, round, and reactive to light.  Neck:     Thyroid: No thyroid mass, thyromegaly or thyroid tenderness.  Cardiovascular:     Rate and Rhythm: Normal rate and regular rhythm.     Pulses: Normal pulses.     Heart sounds: Normal heart sounds.  Pulmonary:     Effort: Pulmonary effort is normal.     Breath sounds: Normal breath sounds.  Abdominal:     General: Bowel sounds are normal.     Palpations: Abdomen is soft.  Genitourinary:    Comments: Deferred to GYN Musculoskeletal:        General: Normal range of motion.     Cervical back: Normal range of motion and neck supple.     Right lower leg: No edema.     Left lower leg: No edema.  Lymphadenopathy:     Cervical: No cervical adenopathy.  Skin:    General: Skin is warm and dry.  Neurological:     Mental Status: She is alert and oriented to person, place, and time.     Cranial Nerves: No cranial nerve deficit.  Psychiatric:        Mood and Affect: Mood normal.        Behavior: Behavior normal.        Thought Content: Thought content normal.     Results for orders placed or performed in visit on 03/17/23  VITAMIN D 25 Hydroxy (Vit-D Deficiency, Fractures)  Result Value Ref Range   VITD 36.46 30.00 - 100.00 ng/mL  Lipid panel  Result Value Ref Range   Cholesterol 173 0 - 200 mg/dL   Triglycerides 67.0 0.0 - 149.0 mg/dL   HDL 65.20 >39.00 mg/dL   VLDL 13.4 0.0 - 40.0 mg/dL   LDL Cholesterol 95 0 - 99 mg/dL   Total CHOL/HDL Ratio 3    NonHDL 108.17   POCT urine pregnancy  Result Value Ref Range   Preg Test, Ur Negative Negative  Assessment & Plan:    Routine Health Maintenance and Physical Exam  Immunization History  Administered Date(s) Administered   Tdap 01/02/2009, 06/20/2020   Health Maintenance  Topic Date Due   INFLUENZA VACCINE  03/28/2023 (Originally 07/28/2022)   COVID-19 Vaccine (1) 04/02/2023 (Originally 01/27/1992)   PAP SMEAR-Modifier  07/09/2025    DTaP/Tdap/Td (3 - Td or Tdap) 06/20/2030   Hepatitis C Screening  Completed   HIV Screening  Completed   HPV VACCINES  Aged Out   Discussed health benefits of physical activity, and encouraged her to engage in regular exercise appropriate for her age and condition.  Problem List Items Addressed This Visit       Cardiovascular and Mediastinum   Migraines    Migraine 2x/month, resolves with use of relpax prn Med refill sent       Relevant Medications   eletriptan (RELPAX) 20 MG tablet   losartan (COZAAR) 25 MG tablet   metoprolol tartrate (LOPRESSOR) 50 MG tablet   Primary hypertension   Relevant Medications   losartan (COZAAR) 25 MG tablet   metoprolol tartrate (LOPRESSOR) 50 MG tablet     Other   Intermittent palpitations    Chronic, describes as fluttering sensation, occurs randomly once a week, last about 57mins, associated with tightness in throat and sweaty palms. Unknown trigger Did not check rate Denies any dizziness or syncope or chest pain or SOB or anxiety Fhx of A-fib: mother diagnosed at age 71 No caffeine, social ETOH consumption, no tobacco use, no illicit drug use Recent labs 12/2022: normal cbc, iron panel, cmp, TSH, T4, T3.  Ordered cardiac even monitor      Relevant Orders   Cardiac event monitor   Vitamin D deficiency    Improved fatigue with vit. D supplement Repeat vit. D today      Relevant Orders   VITAMIN D 25 Hydroxy (Vit-D Deficiency, Fractures) (Completed)   Other Visit Diagnoses     Encounter for preventative adult health care exam with abnormal findings    -  Primary   Encounter for lipid screening for cardiovascular disease       Relevant Orders   Lipid panel (Completed)   Encounter for screening for viral disease       Relevant Orders   HIV Antibody (routine testing w rflx)   RPR   Urine cytology ancillary only   Hepatitis C antibody   Amenorrhea       Relevant Orders   POCT urine pregnancy (Completed)      Return in about  6 months (around 09/17/2023) for HTN.     Wilfred Lacy, NP

## 2023-03-18 LAB — HEPATITIS C ANTIBODY: Hepatitis C Ab: NONREACTIVE

## 2023-03-18 LAB — URINE CYTOLOGY ANCILLARY ONLY
Chlamydia: NEGATIVE
Comment: NEGATIVE
Comment: NEGATIVE
Comment: NORMAL
Neisseria Gonorrhea: NEGATIVE
Trichomonas: NEGATIVE

## 2023-03-18 LAB — RPR: RPR Ser Ql: NONREACTIVE

## 2023-03-18 LAB — HIV ANTIBODY (ROUTINE TESTING W REFLEX): HIV 1&2 Ab, 4th Generation: NONREACTIVE

## 2023-03-18 NOTE — Progress Notes (Signed)
Stable Follow instructions as discussed during office visit.

## 2023-03-19 NOTE — Progress Notes (Signed)
Stable Follow instructions as discussed during office visit.

## 2023-03-28 ENCOUNTER — Ambulatory Visit: Payer: BC Managed Care – PPO | Attending: Nurse Practitioner

## 2023-03-28 DIAGNOSIS — R002 Palpitations: Secondary | ICD-10-CM | POA: Diagnosis not present

## 2023-04-09 ENCOUNTER — Inpatient Hospital Stay: Payer: BC Managed Care – PPO

## 2023-04-09 ENCOUNTER — Ambulatory Visit: Payer: BC Managed Care – PPO | Admitting: Hematology & Oncology

## 2023-04-16 ENCOUNTER — Encounter: Payer: Self-pay | Admitting: Nurse Practitioner

## 2023-04-21 ENCOUNTER — Other Ambulatory Visit: Payer: Self-pay | Admitting: *Deleted

## 2023-04-21 DIAGNOSIS — R5383 Other fatigue: Secondary | ICD-10-CM

## 2023-04-21 DIAGNOSIS — D500 Iron deficiency anemia secondary to blood loss (chronic): Secondary | ICD-10-CM

## 2023-04-21 DIAGNOSIS — D573 Sickle-cell trait: Secondary | ICD-10-CM

## 2023-04-21 DIAGNOSIS — M2550 Pain in unspecified joint: Secondary | ICD-10-CM

## 2023-04-22 ENCOUNTER — Encounter: Payer: Self-pay | Admitting: *Deleted

## 2023-04-22 ENCOUNTER — Inpatient Hospital Stay: Payer: BC Managed Care – PPO | Attending: Hematology & Oncology

## 2023-04-22 ENCOUNTER — Inpatient Hospital Stay: Payer: BC Managed Care – PPO | Admitting: Hematology & Oncology

## 2023-04-22 ENCOUNTER — Encounter: Payer: Self-pay | Admitting: Hematology & Oncology

## 2023-04-22 DIAGNOSIS — G43709 Chronic migraine without aura, not intractable, without status migrainosus: Secondary | ICD-10-CM | POA: Diagnosis not present

## 2023-04-22 DIAGNOSIS — N921 Excessive and frequent menstruation with irregular cycle: Secondary | ICD-10-CM | POA: Insufficient documentation

## 2023-04-22 DIAGNOSIS — M2550 Pain in unspecified joint: Secondary | ICD-10-CM

## 2023-04-22 DIAGNOSIS — D573 Sickle-cell trait: Secondary | ICD-10-CM | POA: Insufficient documentation

## 2023-04-22 DIAGNOSIS — R5383 Other fatigue: Secondary | ICD-10-CM

## 2023-04-22 DIAGNOSIS — Z79899 Other long term (current) drug therapy: Secondary | ICD-10-CM | POA: Diagnosis not present

## 2023-04-22 DIAGNOSIS — D500 Iron deficiency anemia secondary to blood loss (chronic): Secondary | ICD-10-CM | POA: Diagnosis present

## 2023-04-22 LAB — CBC WITH DIFFERENTIAL (CANCER CENTER ONLY)
Abs Immature Granulocytes: 0.02 10*3/uL (ref 0.00–0.07)
Basophils Absolute: 0 10*3/uL (ref 0.0–0.1)
Basophils Relative: 1 %
Eosinophils Absolute: 0.2 10*3/uL (ref 0.0–0.5)
Eosinophils Relative: 2 %
HCT: 36 % (ref 36.0–46.0)
Hemoglobin: 11.9 g/dL — ABNORMAL LOW (ref 12.0–15.0)
Immature Granulocytes: 0 %
Lymphocytes Relative: 21 %
Lymphs Abs: 1.5 10*3/uL (ref 0.7–4.0)
MCH: 26.9 pg (ref 26.0–34.0)
MCHC: 33.1 g/dL (ref 30.0–36.0)
MCV: 81.3 fL (ref 80.0–100.0)
Monocytes Absolute: 0.4 10*3/uL (ref 0.1–1.0)
Monocytes Relative: 5 %
Neutro Abs: 5 10*3/uL (ref 1.7–7.7)
Neutrophils Relative %: 71 %
Platelet Count: 431 10*3/uL — ABNORMAL HIGH (ref 150–400)
RBC: 4.43 MIL/uL (ref 3.87–5.11)
RDW: 13.9 % (ref 11.5–15.5)
WBC Count: 7.1 10*3/uL (ref 4.0–10.5)
nRBC: 0 % (ref 0.0–0.2)

## 2023-04-22 LAB — CMP (CANCER CENTER ONLY)
ALT: 8 U/L (ref 0–44)
AST: 11 U/L — ABNORMAL LOW (ref 15–41)
Albumin: 4 g/dL (ref 3.5–5.0)
Alkaline Phosphatase: 78 U/L (ref 38–126)
Anion gap: 9 (ref 5–15)
BUN: 8 mg/dL (ref 6–20)
CO2: 31 mmol/L (ref 22–32)
Calcium: 9.2 mg/dL (ref 8.9–10.3)
Chloride: 102 mmol/L (ref 98–111)
Creatinine: 0.74 mg/dL (ref 0.44–1.00)
GFR, Estimated: >60 mL/min (ref >60)
Glucose, Bld: 104 mg/dL — ABNORMAL HIGH (ref 70–99)
Potassium: 4.2 mmol/L (ref 3.5–5.1)
Sodium: 142 mmol/L (ref 135–145)
Total Bilirubin: 0.3 mg/dL (ref 0.3–1.2)
Total Protein: 7.3 g/dL (ref 6.5–8.1)

## 2023-04-22 LAB — LACTATE DEHYDROGENASE: LDH: 114 U/L (ref 98–192)

## 2023-04-22 LAB — IRON AND IRON BINDING CAPACITY (CC-WL,HP ONLY)
Iron: 83 ug/dL (ref 28–170)
Saturation Ratios: 25 % (ref 10.4–31.8)
TIBC: 330 ug/dL (ref 250–450)
UIBC: 247 ug/dL (ref 148–442)

## 2023-04-22 MED ORDER — METOPROLOL TARTRATE 75 MG PO TABS
75.0000 mg | ORAL_TABLET | Freq: Two times a day (BID) | ORAL | 3 refills | Status: DC
Start: 2023-04-22 — End: 2023-11-10

## 2023-04-22 NOTE — Progress Notes (Signed)
Hematology and Oncology Follow Up Visit  Kimberly Guerra [id number] 02/18/1986 37 y.o. 04/22/2023   Principle Diagnosis:  Iron deficiency anemia-menometrorrhagia Sickle cell trait  Current Therapy:   IV iron-Venofer given on 10/22/2022  Folic acid 2 mg p.o. daily     Interim History:  Ms. Fackrell is back for follow-up.  She is doing pretty well.  She is still selling cars.  The interest rates are little bit of a deterrent for some people to buy a car.  She last got iron I think back in October 2023.  When we last saw her I think back in January, the ferritin was 195 with an iron saturation of 19%..  She is having her monthly cycle right now.  She has had no problem with cough.  Her appetite is doing okay.  She and her mom had a quiet Easter together.  She has had no change in bowel or bladder habits.  Her blood pressure is on the high side.  I think we will probably increase the metoprolol up to 75 mg p.o. twice daily.  She has had no rashes.  There is been no fever.  She has had no issues with COVID.  Overall, I would say that her performance status is ECOG 1.    Medications:  Current Outpatient Medications:    ergocalciferol (VITAMIN D2) 1.25 MG (50000 UT) capsule, Take 1 capsule (50,000 Units total) by mouth once a week., Disp: 4 capsule, Rfl: 3   folic acid (FOLVITE) 1 MG tablet, Take 2 tablets (2 mg total) by mouth daily., Disp: 180 tablet, Rfl: 6   losartan (COZAAR) 25 MG tablet, Take 1 tablet (25 mg total) by mouth daily., Disp: 90 tablet, Rfl: 3   metoprolol tartrate (LOPRESSOR) 50 MG tablet, Take 1 tablet (50 mg total) by mouth 2 (two) times daily., Disp: 180 tablet, Rfl: 3   albuterol (VENTOLIN HFA) 108 (90 Base) MCG/ACT inhaler, Inhale 2 puffs into the lungs every 6 (six) hours as needed for wheezing or shortness of breath. (Patient not taking: Reported on 04/22/2023), Disp: 18 g, Rfl: 1   cyclobenzaprine (FLEXERIL) 10 MG tablet, Take 1 tablet (10 mg total) by mouth 2  (two) times daily as needed for muscle spasms. (Patient not taking: Reported on 04/22/2023), Disp: 20 tablet, Rfl: 0   eletriptan (RELPAX) 20 MG tablet, Take 1 tablet (20 mg total) by mouth daily as needed for migraine or headache. May repeat in 2 hours if headache persists or recurs. No more than  in 24hrs (Patient not taking: Reported on 04/22/2023), Disp: 12 tablet, Rfl: 0   famotidine (PEPCID) 40 MG tablet, Take 40 mg by mouth at bedtime. (Patient not taking: Reported on 04/22/2023), Disp: , Rfl:    fluticasone (FLONASE) 50 MCG/ACT nasal spray, Place 2 sprays into both nostrils daily. (Patient not taking: Reported on 04/22/2023), Disp: 16 g, Rfl: 0   Multiple Vitamins-Minerals (WOMENS MULTIVITAMIN PLUS) TABS, Take by mouth daily. (Patient not taking: Reported on 04/22/2023), Disp: , Rfl:    valACYclovir (VALTREX) 1000 MG tablet, Take 1,000 mg by mouth. (Patient not taking: Reported on 04/22/2023), Disp: , Rfl:   Allergies:  Allergies  Allergen Reactions   Topamax [Topiramate] Palpitations    Heart palpitations    Past Medical History, Surgical history, Social history, and Family History were reviewed and updated.  Review of Systems: Review of Systems  Constitutional: Negative.   HENT:  Negative.    Eyes: Negative.   Respiratory: Negative.    Cardiovascular:  Negative.   Gastrointestinal: Negative.   Endocrine: Negative.   Genitourinary: Negative.    Musculoskeletal: Negative.   Skin: Negative.   Neurological: Negative.   Hematological: Negative.   Psychiatric/Behavioral: Negative.      Physical Exam:  height is  (1.473 m) and weight is 166 lb 4 oz (75.4 kg). Her oral temperature is 98.9 F (37.2 C). Her blood pressure is 135/95 (abnormal) and her pulse is 87. Her respiration is 18 and oxygen saturation is 100%.   Wt Readings from Last 3 Encounters:  04/22/23 166 lb 4 oz (75.4 kg)  03/17/23 165 lb 6.4 oz (75 kg)  01/07/23 165 lb (74.8 kg)    Physical Exam Vitals  reviewed.  HENT:     Head: Normocephalic and atraumatic.  Eyes:     Pupils: Pupils are equal, round, and reactive to light.  Cardiovascular:     Rate and Rhythm: Normal rate and regular rhythm.     Heart sounds: Normal heart sounds.  Pulmonary:     Effort: Pulmonary effort is normal.     Breath sounds: Normal breath sounds.  Abdominal:     General: Bowel sounds are normal.     Palpations: Abdomen is soft.  Musculoskeletal:        General: No tenderness or deformity. Normal range of motion.     Cervical back: Normal range of motion.  Lymphadenopathy:     Cervical: No cervical adenopathy.  Skin:    General: Skin is warm and dry.     Findings: No erythema or rash.  Neurological:     Mental Status: She is alert and oriented to person, place, and time.  Psychiatric:        Behavior: Behavior normal.        Thought Content: Thought content normal.        Judgment: Judgment normal.      Lab Results  Component Value Date   WBC 7.1 04/22/2023   HGB 11.9 (L) 04/22/2023   HCT 36.0 04/22/2023   MCV 81.3 04/22/2023   PLT 431 (H) 04/22/2023     Chemistry      Component Value Date/Time   NA 142 04/22/2023 1323   K 4.2 04/22/2023 1323   CL 102 04/22/2023 1323   CO2 31 04/22/2023 1323   BUN 8 04/22/2023 1323   CREATININE 0.74 04/22/2023 1323   CREATININE 0.69 12/06/2013 1631      Component Value Date/Time   CALCIUM 9.2 04/22/2023 1323   ALKPHOS 78 04/22/2023 1323   AST 11 (L) 04/22/2023 1323   ALT 8 04/22/2023 1323   BILITOT 0.3 04/22/2023 1323      Impression and Plan: Ms. Abramo is a very charming 37 year old Afro-American female.  She is a whole lot of fun to talk to.  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  We will see what her iron levels look like.  Hopefully, they are not too bad.  Her hemoglobin is little on the lower side so her iron might be on the low side.  We will increase the metoprolol.  Hopefully this will help get her blood pressure  down a little bit.  We will plan to see her back in about 3 months.  I think she is going to go down to [place] for a baby shower.  Josph Macho, MD 4/25/20242:11 PM

## 2023-04-23 LAB — FERRITIN: Ferritin: 136 ng/mL (ref 11–307)

## 2023-04-25 ENCOUNTER — Other Ambulatory Visit: Payer: Self-pay | Admitting: Nurse Practitioner

## 2023-04-25 DIAGNOSIS — G43709 Chronic migraine without aura, not intractable, without status migrainosus: Secondary | ICD-10-CM

## 2023-05-02 ENCOUNTER — Telehealth: Payer: BC Managed Care – PPO | Admitting: Physician Assistant

## 2023-05-02 DIAGNOSIS — R3989 Other symptoms and signs involving the genitourinary system: Secondary | ICD-10-CM | POA: Diagnosis not present

## 2023-05-03 MED ORDER — NITROFURANTOIN MONOHYD MACRO 100 MG PO CAPS
100.0000 mg | ORAL_CAPSULE | Freq: Two times a day (BID) | ORAL | 0 refills | Status: DC
Start: 2023-05-03 — End: 2023-07-22

## 2023-05-03 NOTE — Progress Notes (Signed)
E-Visit for Urinary Problems  We are sorry that you are not feeling well.  Here is how we plan to help!  Based on what you shared with me it looks like you most likely have a simple urinary tract infection.  A UTI (Urinary Tract Infection) is a bacterial infection of the bladder.  Most cases of urinary tract infections are simple to treat but a key part of your care is to encourage you to drink plenty of fluids and watch your symptoms carefully.  I have prescribed MacroBid 100 mg twice a day for 5 days.  Your symptoms should gradually improve. Call us if the burning in your urine worsens, you develop worsening fever, back pain or pelvic pain or if your symptoms do not resolve after completing the antibiotic.  Urinary tract infections can be prevented by drinking plenty of water to keep your body hydrated.  Also be sure when you wipe, wipe from front to back and don't hold it in!  If possible, empty your bladder every 4 hours.  HOME CARE Drink plenty of fluids Compete the full course of the antibiotics even if the symptoms resolve Remember, when you need to go.go. Holding in your urine can increase the likelihood of getting a UTI! GET HELP RIGHT AWAY IF: You cannot urinate You get a high fever Worsening back pain occurs You see blood in your urine You feel sick to your stomach or throw up You feel like you are going to pass out  MAKE SURE YOU  Understand these instructions. Will watch your condition. Will get help right away if you are not doing well or get worse.   Thank you for choosing an e-visit.  Your e-visit answers were reviewed by a board certified advanced clinical practitioner to complete your personal care plan. Depending upon the condition, your plan could have included both over the counter or prescription medications.  Please review your pharmacy choice. Make sure the pharmacy is open so you can pick up prescription now. If there is a problem, you may contact your  provider through MyChart messaging and have the prescription routed to another pharmacy.  Your safety is important to us. If you have drug allergies check your prescription carefully.   For the next 24 hours you can use MyChart to ask questions about today's visit, request a non-urgent call back, or ask for a work or school excuse. You will get an email in the next two days asking about your experience. I hope that your e-visit has been valuable and will speed your recovery.  I have spent 5 minutes in review of e-visit questionnaire, review and updating patient chart, medical decision making and response to patient.   Davi Kroon M Koki Buxton, PA-C  

## 2023-07-22 ENCOUNTER — Inpatient Hospital Stay (HOSPITAL_BASED_OUTPATIENT_CLINIC_OR_DEPARTMENT_OTHER): Payer: BC Managed Care – PPO | Admitting: Medical Oncology

## 2023-07-22 ENCOUNTER — Inpatient Hospital Stay: Payer: BC Managed Care – PPO | Attending: Hematology & Oncology

## 2023-07-22 ENCOUNTER — Other Ambulatory Visit: Payer: Self-pay

## 2023-07-22 ENCOUNTER — Encounter: Payer: Self-pay | Admitting: Medical Oncology

## 2023-07-22 VITALS — BP 127/86 | HR 70 | Temp 98.6°F | Resp 18 | Ht <= 58 in | Wt 171.0 lb

## 2023-07-22 DIAGNOSIS — R5383 Other fatigue: Secondary | ICD-10-CM | POA: Diagnosis not present

## 2023-07-22 DIAGNOSIS — N921 Excessive and frequent menstruation with irregular cycle: Secondary | ICD-10-CM | POA: Diagnosis not present

## 2023-07-22 DIAGNOSIS — D500 Iron deficiency anemia secondary to blood loss (chronic): Secondary | ICD-10-CM

## 2023-07-22 DIAGNOSIS — G43709 Chronic migraine without aura, not intractable, without status migrainosus: Secondary | ICD-10-CM

## 2023-07-22 DIAGNOSIS — D573 Sickle-cell trait: Secondary | ICD-10-CM | POA: Diagnosis not present

## 2023-07-22 LAB — RETICULOCYTES
Immature Retic Fract: 14.3 % (ref 2.3–15.9)
RBC.: 4.48 MIL/uL (ref 3.87–5.11)
Retic Count, Absolute: 60 10*3/uL (ref 19.0–186.0)
Retic Ct Pct: 1.3 % (ref 0.4–3.1)

## 2023-07-22 LAB — CBC WITH DIFFERENTIAL (CANCER CENTER ONLY)
Abs Immature Granulocytes: 0.11 10*3/uL — ABNORMAL HIGH (ref 0.00–0.07)
Basophils Absolute: 0 10*3/uL (ref 0.0–0.1)
Basophils Relative: 0 %
Eosinophils Absolute: 0.1 10*3/uL (ref 0.0–0.5)
Eosinophils Relative: 1 %
HCT: 35.2 % — ABNORMAL LOW (ref 36.0–46.0)
Hemoglobin: 11.5 g/dL — ABNORMAL LOW (ref 12.0–15.0)
Immature Granulocytes: 1 %
Lymphocytes Relative: 21 %
Lymphs Abs: 1.8 10*3/uL (ref 0.7–4.0)
MCH: 26.1 pg (ref 26.0–34.0)
MCHC: 32.7 g/dL (ref 30.0–36.0)
MCV: 79.8 fL — ABNORMAL LOW (ref 80.0–100.0)
Monocytes Absolute: 0.4 10*3/uL (ref 0.1–1.0)
Monocytes Relative: 5 %
Neutro Abs: 6.2 10*3/uL (ref 1.7–7.7)
Neutrophils Relative %: 72 %
Platelet Count: 477 10*3/uL — ABNORMAL HIGH (ref 150–400)
RBC: 4.41 MIL/uL (ref 3.87–5.11)
RDW: 13.8 % (ref 11.5–15.5)
WBC Count: 8.7 10*3/uL (ref 4.0–10.5)
nRBC: 0 % (ref 0.0–0.2)

## 2023-07-22 LAB — CMP (CANCER CENTER ONLY)
ALT: 9 U/L (ref 0–44)
AST: 11 U/L — ABNORMAL LOW (ref 15–41)
Albumin: 4.3 g/dL (ref 3.5–5.0)
Alkaline Phosphatase: 83 U/L (ref 38–126)
Anion gap: 9 (ref 5–15)
BUN: 10 mg/dL (ref 6–20)
CO2: 25 mmol/L (ref 22–32)
Calcium: 9.1 mg/dL (ref 8.9–10.3)
Chloride: 102 mmol/L (ref 98–111)
Creatinine: 0.61 mg/dL (ref 0.44–1.00)
GFR, Estimated: >60 mL/min (ref >60)
Glucose, Bld: 98 mg/dL (ref 70–99)
Potassium: 4.1 mmol/L (ref 3.5–5.1)
Sodium: 136 mmol/L (ref 135–145)
Total Bilirubin: 0.3 mg/dL (ref 0.3–1.2)
Total Protein: 7.6 g/dL (ref 6.5–8.1)

## 2023-07-22 LAB — IRON AND IRON BINDING CAPACITY (CC-WL,HP ONLY)
Iron: 38 ug/dL (ref 28–170)
Saturation Ratios: 10 % — ABNORMAL LOW (ref 10.4–31.8)
TIBC: 367 ug/dL (ref 250–450)
UIBC: 329 ug/dL (ref 148–442)

## 2023-07-22 LAB — FERRITIN: Ferritin: 77 ng/mL (ref 11–307)

## 2023-07-22 NOTE — Progress Notes (Signed)
Hematology and Oncology Follow Up Visit  Kimberly Guerra [id number] 1986/08/12 37 y.o. 07/22/2023   Principle Diagnosis:  Iron deficiency anemia-menometrorrhagia Sickle cell trait  Current Therapy:   IV iron-Venofer given on 10/22/2022  Folic acid 2 mg p.o. daily     Interim History:  Kimberly Guerra is back for follow-up.  She is doing pretty well despite chronic fatigue.  She has finished her prescription vitamin D supplement which she took once weekly. Now on OTC supplementation.   She has had no change in bowel or bladder habits. No bleeding or clotting events other than her menstrual cycles.   At her last visit BP was a bit elevated so metoprolol was dosed up to 75 mg p.o. twice daily. She has done well on this. She does not feel that this is contributing to her chronic fatigue.   Overall, I would say that her performance status is ECOG 1.   Wt Readings from Last 3 Encounters:  07/22/23 171 lb (77.6 kg)  04/22/23 166 lb 4 oz (75.4 kg)  03/17/23 165 lb 6.4 oz (75 kg)   Medications:  Current Outpatient Medications:    albuterol (VENTOLIN HFA) 108 (90 Base) MCG/ACT inhaler, Inhale 2 puffs into the lungs every 6 (six) hours as needed for wheezing or shortness of breath., Disp: 18 g, Rfl: 1   cyclobenzaprine (FLEXERIL) 10 MG tablet, Take 1 tablet (10 mg total) by mouth 2 (two) times daily as needed for muscle spasms., Disp: 20 tablet, Rfl: 0   eletriptan (RELPAX) 20 MG tablet, TAKE 1 TABLET (20 MG TOTAL) BY MOUTH DAILY AS NEEDED FOR MIGRAINE OR HEADACHE. MAY REPEAT IN 2 HOURS IF HEADACHE PERSISTS OR RECURS. NO MORE THAN 80MG  IN 24HRS, Disp: 12 tablet, Rfl: 0   folic acid (FOLVITE) 1 MG tablet, Take 2 tablets (2 mg total) by mouth daily., Disp: 180 tablet, Rfl: 6   losartan (COZAAR) 25 MG tablet, Take 1 tablet (25 mg total) by mouth daily., Disp: 90 tablet, Rfl: 3   metoprolol tartrate 75 MG TABS, Take 1 tablet (75 mg total) by mouth 2 (two) times daily., Disp: 60 tablet, Rfl: 3    Multiple Vitamins-Minerals (WOMENS MULTIVITAMIN PLUS) TABS, Take by mouth daily., Disp: , Rfl:    valACYclovir (VALTREX) 1000 MG tablet, Take 1,000 mg by mouth., Disp: , Rfl:    fluticasone (FLONASE) 50 MCG/ACT nasal spray, Place 2 sprays into both nostrils daily. (Patient not taking: Reported on 04/22/2023), Disp: 16 g, Rfl: 0  Allergies:  Allergies  Allergen Reactions   Procardia Xl [Nifedipine Er] Hives, Itching and Other (See Comments)    Severe headache   Topamax [Topiramate] Palpitations    Heart palpitations    Past Medical History, Surgical history, Social history, and Family History were reviewed and updated.  Review of Systems: Review of Systems  Constitutional: Negative.   HENT:  Negative.    Eyes: Negative.   Respiratory: Negative.    Cardiovascular: Negative.   Gastrointestinal: Negative.   Endocrine: Negative.   Genitourinary: Negative.    Musculoskeletal: Negative.   Skin: Negative.   Neurological: Negative.   Hematological: Negative.   Psychiatric/Behavioral: Negative.      Physical Exam:  height is 4\' 10"  (1.473 m) and weight is 171 lb (77.6 kg). Her oral temperature is 98.6 F (37 C). Her blood pressure is 127/86 and her pulse is 70. Her respiration is 18 and oxygen saturation is 100%.   Wt Readings from Last 3 Encounters:  07/22/23 171 lb (77.6  kg)  04/22/23 166 lb 4 oz (75.4 kg)  03/17/23 165 lb 6.4 oz (75 kg)    Physical Exam Vitals reviewed.  HENT:     Head: Normocephalic and atraumatic.  Eyes:     Pupils: Pupils are equal, round, and reactive to light.  Cardiovascular:     Rate and Rhythm: Normal rate and regular rhythm.     Heart sounds: Normal heart sounds.  Pulmonary:     Effort: Pulmonary effort is normal.     Breath sounds: Normal breath sounds.  Abdominal:     General: Bowel sounds are normal.     Palpations: Abdomen is soft.  Musculoskeletal:        General: No tenderness or deformity. Normal range of motion.     Cervical back:  Normal range of motion.  Lymphadenopathy:     Cervical: No cervical adenopathy.  Skin:    General: Skin is warm and dry.     Findings: No erythema or rash.  Neurological:     Mental Status: She is alert and oriented to person, place, and time.  Psychiatric:        Behavior: Behavior normal.        Thought Content: Thought content normal.        Judgment: Judgment normal.      Lab Results  Component Value Date   WBC 8.7 07/22/2023   HGB 11.5 (L) 07/22/2023   HCT 35.2 (L) 07/22/2023   MCV 79.8 (L) 07/22/2023   PLT 477 (H) 07/22/2023     Chemistry      Component Value Date/Time   NA 142 04/22/2023 1323   K 4.2 04/22/2023 1323   CL 102 04/22/2023 1323   CO2 31 04/22/2023 1323   BUN 8 04/22/2023 1323   CREATININE 0.74 04/22/2023 1323   CREATININE 0.69 12/06/2013 1631      Component Value Date/Time   CALCIUM 9.2 04/22/2023 1323   ALKPHOS 78 04/22/2023 1323   AST 11 (L) 04/22/2023 1323   ALT 8 04/22/2023 1323   BILITOT 0.3 04/22/2023 1323      Impression and Plan: Kimberly Guerra is a very charming 37 year old Afro-American female.   Iron studied pending. Hgb 11.5, (previously 11.9), MCV now 79.8, platelets 477 (previously 431). Reticulocytes 14.3%. Suspect she may need iron.   We will plan to see her back in about 3 months.   Disposition RTC 3 months APP, labs ( CBC w/, CMP, ferritin, iron, retic, LDH, Vit D, TSH)-[place]   Brand Males [place], [place] 7/25/202410:33 AM

## 2023-07-27 ENCOUNTER — Other Ambulatory Visit: Payer: Self-pay | Admitting: Hematology & Oncology

## 2023-07-28 ENCOUNTER — Telehealth: Payer: BC Managed Care – PPO | Admitting: Nurse Practitioner

## 2023-07-28 DIAGNOSIS — R3989 Other symptoms and signs involving the genitourinary system: Secondary | ICD-10-CM

## 2023-07-28 MED ORDER — CEPHALEXIN 500 MG PO CAPS
500.0000 mg | ORAL_CAPSULE | Freq: Two times a day (BID) | ORAL | 0 refills | Status: AC
Start: 2023-07-28 — End: 2023-08-04

## 2023-07-28 NOTE — Progress Notes (Signed)
E-Visit for Urinary Problems  We are sorry that you are not feeling well.  Here is how we plan to help!  Based on what you shared with me it looks like you most likely have a simple urinary tract infection.  A UTI (Urinary Tract Infection) is a bacterial infection of the bladder.  Most cases of urinary tract infections are simple to treat but a key part of your care is to encourage you to drink plenty of fluids and watch your symptoms carefully.  I have prescribed Keflex 500 mg twice a day for 7 days.  Your symptoms should gradually improve. Call us if the burning in your urine worsens, you develop worsening fever, back pain or pelvic pain or if your symptoms do not resolve after completing the antibiotic.  Urinary tract infections can be prevented by drinking plenty of water to keep your body hydrated.  Also be sure when you wipe, wipe from front to back and don't hold it in!  If possible, empty your bladder every 4 hours.  HOME CARE Drink plenty of fluids Compete the full course of the antibiotics even if the symptoms resolve Remember, when you need to go.go. Holding in your urine can increase the likelihood of getting a UTI! GET HELP RIGHT AWAY IF: You cannot urinate You get a high fever Worsening back pain occurs You see blood in your urine You feel sick to your stomach or throw up You feel like you are going to pass out  MAKE SURE YOU  Understand these instructions. Will watch your condition. Will get help right away if you are not doing well or get worse.   Thank you for choosing an e-visit.  Your e-visit answers were reviewed by a board certified advanced clinical practitioner to complete your personal care plan. Depending upon the condition, your plan could have included both over the counter or prescription medications.  Please review your pharmacy choice. Make sure the pharmacy is open so you can pick up prescription now. If there is a problem, you may contact your  provider through MyChart messaging and have the prescription routed to another pharmacy.  Your safety is important to us. If you have drug allergies check your prescription carefully.   For the next 24 hours you can use MyChart to ask questions about today's visit, request a non-urgent call back, or ask for a work or school excuse. You will get an email in the next two days asking about your experience. I hope that your e-visit has been valuable and will speed your recovery.   Meds ordered this encounter  Medications   cephALEXin (KEFLEX) 500 MG capsule    Sig: Take 1 capsule (500 mg total) by mouth 2 (two) times daily for 7 days.    Dispense:  14 capsule    Refill:  0    I spent approximately 5 minutes reviewing the patient's history, current symptoms and coordinating their care today.   

## 2023-08-05 ENCOUNTER — Inpatient Hospital Stay: Payer: BC Managed Care – PPO | Attending: Medical Oncology

## 2023-08-05 VITALS — BP 112/63 | HR 72 | Temp 98.4°F | Resp 20

## 2023-08-05 DIAGNOSIS — N921 Excessive and frequent menstruation with irregular cycle: Secondary | ICD-10-CM | POA: Diagnosis not present

## 2023-08-05 DIAGNOSIS — D500 Iron deficiency anemia secondary to blood loss (chronic): Secondary | ICD-10-CM | POA: Insufficient documentation

## 2023-08-05 MED ORDER — SODIUM CHLORIDE 0.9 % IV SOLN
300.0000 mg | Freq: Once | INTRAVENOUS | Status: AC
  Administered 2023-08-05: 300 mg via INTRAVENOUS
  Filled 2023-08-05: qty 300

## 2023-08-05 MED ORDER — SODIUM CHLORIDE 0.9 % IV SOLN: Freq: Once | INTRAVENOUS | Status: AC

## 2023-08-05 NOTE — Patient Instructions (Signed)
Iron Sucrose Injection What is this medication? IRON SUCROSE (EYE ern SOO krose) treats low levels of iron (iron deficiency anemia) in people with kidney disease. Iron is a mineral that plays an important role in making red blood cells, which carry oxygen from your lungs to the rest of your body. This medicine may be used for other purposes; ask your health care provider or pharmacist if you have questions. COMMON BRAND NAME(S): Venofer What should I tell my care team before I take this medication? They need to know if you have any of these conditions: Anemia not caused by low iron levels Heart disease High levels of iron in the blood Kidney disease Liver disease An unusual or allergic reaction to iron, other medications, foods, dyes, or preservatives Pregnant or trying to get pregnant Breastfeeding How should I use this medication? This medication is for infusion into a vein. It is given in a hospital or clinic setting. Talk to your care team about the use of this medication in children. While this medication may be prescribed for children as young as 2 years for selected conditions, precautions do apply. Overdosage: If you think you have taken too much of this medicine contact a poison control center or emergency room at once. NOTE: This medicine is only for you. Do not share this medicine with others. What if I miss a dose? Keep appointments for follow-up doses. It is important not to miss your dose. Call your care team if you are unable to keep an appointment. What may interact with this medication? Do not take this medication with any of the following: Deferoxamine Dimercaprol Other iron products This medication may also interact with the following: Chloramphenicol Deferasirox This list may not describe all possible interactions. Give your health care provider a list of all the medicines, herbs, non-prescription drugs, or dietary supplements you use. Also tell them if you smoke,  drink alcohol, or use illegal drugs. Some items may interact with your medicine. What should I watch for while using this medication? Visit your care team regularly. Tell your care team if your symptoms do not start to get better or if they get worse. You may need blood work done while you are taking this medication. You may need to follow a special diet. Talk to your care team. Foods that contain iron include: whole grains/cereals, dried fruits, beans, or peas, leafy green vegetables, and organ meats (liver, kidney). What side effects may I notice from receiving this medication? Side effects that you should report to your care team as soon as possible: Allergic reactions--skin rash, itching, hives, swelling of the face, lips, tongue, or throat Low blood pressure--dizziness, feeling faint or lightheaded, blurry vision Shortness of breath Side effects that usually do not require medical attention (report to your care team if they continue or are bothersome): Flushing Headache Joint pain Muscle pain Nausea Pain, redness, or irritation at injection site This list may not describe all possible side effects. Call your doctor for medical advice about side effects. You may report side effects to FDA at 1-800-FDA-1088. Where should I keep my medication? This medication is given in a hospital or clinic. It will not be stored at home. NOTE: This sheet is a summary. It may not cover all possible information. If you have questions about this medicine, talk to your doctor, pharmacist, or health care provider.  2024 Elsevier/Gold Standard (2023-05-21 00:00:00)

## 2023-08-12 ENCOUNTER — Inpatient Hospital Stay: Payer: BC Managed Care – PPO

## 2023-08-12 VITALS — HR 98 | Temp 98.0°F | Resp 19

## 2023-08-12 DIAGNOSIS — N921 Excessive and frequent menstruation with irregular cycle: Secondary | ICD-10-CM

## 2023-08-12 DIAGNOSIS — D500 Iron deficiency anemia secondary to blood loss (chronic): Secondary | ICD-10-CM

## 2023-08-12 MED ORDER — SODIUM CHLORIDE 0.9 % IV SOLN
300.0000 mg | Freq: Once | INTRAVENOUS | Status: AC
  Administered 2023-08-12: 300 mg via INTRAVENOUS
  Filled 2023-08-12: qty 300

## 2023-08-12 MED ORDER — SODIUM CHLORIDE 0.9 % IV SOLN: Freq: Once | INTRAVENOUS | Status: AC

## 2023-08-12 NOTE — Patient Instructions (Signed)
 Iron Sucrose Injection What is this medication? IRON SUCROSE (EYE ern SOO krose) treats low levels of iron (iron deficiency anemia) in people with kidney disease. Iron is a mineral that plays an important role in making red blood cells, which carry oxygen from your lungs to the rest of your body. This medicine may be used for other purposes; ask your health care provider or pharmacist if you have questions. COMMON BRAND NAME(S): Venofer What should I tell my care team before I take this medication? They need to know if you have any of these conditions: Anemia not caused by low iron levels Heart disease High levels of iron in the blood Kidney disease Liver disease An unusual or allergic reaction to iron, other medications, foods, dyes, or preservatives Pregnant or trying to get pregnant Breastfeeding How should I use this medication? This medication is for infusion into a vein. It is given in a hospital or clinic setting. Talk to your care team about the use of this medication in children. While this medication may be prescribed for children as young as 2 years for selected conditions, precautions do apply. Overdosage: If you think you have taken too much of this medicine contact a poison control center or emergency room at once. NOTE: This medicine is only for you. Do not share this medicine with others. What if I miss a dose? Keep appointments for follow-up doses. It is important not to miss your dose. Call your care team if you are unable to keep an appointment. What may interact with this medication? Do not take this medication with any of the following: Deferoxamine Dimercaprol Other iron products This medication may also interact with the following: Chloramphenicol Deferasirox This list may not describe all possible interactions. Give your health care provider a list of all the medicines, herbs, non-prescription drugs, or dietary supplements you use. Also tell them if you smoke,  drink alcohol, or use illegal drugs. Some items may interact with your medicine. What should I watch for while using this medication? Visit your care team regularly. Tell your care team if your symptoms do not start to get better or if they get worse. You may need blood work done while you are taking this medication. You may need to follow a special diet. Talk to your care team. Foods that contain iron include: whole grains/cereals, dried fruits, beans, or peas, leafy green vegetables, and organ meats (liver, kidney). What side effects may I notice from receiving this medication? Side effects that you should report to your care team as soon as possible: Allergic reactions--skin rash, itching, hives, swelling of the face, lips, tongue, or throat Low blood pressure--dizziness, feeling faint or lightheaded, blurry vision Shortness of breath Side effects that usually do not require medical attention (report to your care team if they continue or are bothersome): Flushing Headache Joint pain Muscle pain Nausea Pain, redness, or irritation at injection site This list may not describe all possible side effects. Call your doctor for medical advice about side effects. You may report side effects to FDA at 1-800-FDA-1088. Where should I keep my medication? This medication is given in a hospital or clinic. It will not be stored at home. NOTE: This sheet is a summary. It may not cover all possible information. If you have questions about this medicine, talk to your doctor, pharmacist, or health care provider.  2024 Elsevier/Gold Standard (2023-05-21 00:00:00)

## 2023-08-17 ENCOUNTER — Other Ambulatory Visit: Payer: Self-pay

## 2023-08-17 ENCOUNTER — Telehealth: Payer: Self-pay | Admitting: Nurse Practitioner

## 2023-08-17 MED ORDER — ALBUTEROL SULFATE HFA 108 (90 BASE) MCG/ACT IN AERS: 2.0000 | INHALATION_SPRAY | Freq: Four times a day (QID) | RESPIRATORY_TRACT | 1 refills | Status: DC | PRN

## 2023-08-17 NOTE — Telephone Encounter (Signed)
Caller Name: Zaelynn Call back phone #: [phone]   MEDICATION(S):  albuterol (VENTOLIN HFA) 108 (90 Base) MCG/ACT inhaler [[id number]]   Days of Med Remaining: 0   [facility] [address]. Please change to her permanent pharmacy

## 2023-08-17 NOTE — Telephone Encounter (Signed)
Sent!

## 2023-08-17 NOTE — Telephone Encounter (Signed)
[id number] [place], [place] - [address] AT [facility] [address], [place] [address] Phone: [phone]  Fax: [phone]

## 2023-09-22 ENCOUNTER — Telehealth: Payer: BC Managed Care – PPO | Admitting: Family Medicine

## 2023-09-22 DIAGNOSIS — B3731 Acute candidiasis of vulva and vagina: Secondary | ICD-10-CM

## 2023-09-22 MED ORDER — FLUCONAZOLE 150 MG PO TABS: 150.0000 mg | ORAL_TABLET | Freq: Every day | ORAL | 0 refills | Status: DC

## 2023-09-22 NOTE — Progress Notes (Signed)

## 2023-09-26 ENCOUNTER — Telehealth: Payer: BC Managed Care – PPO | Admitting: Family

## 2023-09-26 DIAGNOSIS — N898 Other specified noninflammatory disorders of vagina: Secondary | ICD-10-CM

## 2023-09-26 DIAGNOSIS — R399 Unspecified symptoms and signs involving the genitourinary system: Secondary | ICD-10-CM

## 2023-09-26 NOTE — Progress Notes (Signed)
Because you are having UTI symptoms and vaginal discharge, I feel your condition warrants further evaluation and I recommend that you be seen in a face to face visit.   NOTE: There will be NO CHARGE for this eVisit   If you are having a true medical emergency please call 911.      For an urgent face to face visit, [facility] has eight urgent care centers for your convenience:   NEW!! [facility] Health Urgent Care Center at [facility] Get Driving Directions [phone] [address] [place], [address]    [facility] Health Urgent Care Center at [facility] Get Driving Directions [phone] [address] [place], [place] [address]   [facility] Health Urgent Care Center [facility]) Get Driving Directions [phone] [address] [place], [place] [address]  [facility] Health Urgent Care Center [facility] [place]) Get Driving Directions [phone] [address] [place],  [place]  [address]  [facility] Health Urgent Care Center [facility] - at [employer]  [phone] [place],  [place] [address]   [facility] Health Urgent Care at [facility] Get Driving Directions [phone] [address] [place], [place] [address]   [facility] Health Urgent Care at [facility] Get Driving Directions  [phone] [address] [place], [place] [address]   [facility] Health Urgent Care at [facility] Directions [phone] [address] [place], [place] [address]  Your MyChart E-visit questionnaire answers were reviewed by a board certified advanced clinical practitioner to complete your personal care plan based on your specific symptoms.  Thank you for using e-Visits.

## 2023-10-19 ENCOUNTER — Ambulatory Visit: Payer: BC Managed Care – PPO | Admitting: Nurse Practitioner

## 2023-10-19 ENCOUNTER — Encounter: Payer: Self-pay | Admitting: Nurse Practitioner

## 2023-10-19 VITALS — BP 131/77 | HR 71 | Temp 98.3°F | Resp 18 | Wt 177.0 lb

## 2023-10-19 DIAGNOSIS — R1013 Epigastric pain: Secondary | ICD-10-CM | POA: Insufficient documentation

## 2023-10-19 DIAGNOSIS — L509 Urticaria, unspecified: Secondary | ICD-10-CM | POA: Diagnosis not present

## 2023-10-19 DIAGNOSIS — I10 Essential (primary) hypertension: Secondary | ICD-10-CM | POA: Diagnosis not present

## 2023-10-19 LAB — POCT URINALYSIS DIPSTICK
Bilirubin, UA: NEGATIVE
Blood, UA: NEGATIVE
Glucose, UA: NEGATIVE
Ketones, UA: NEGATIVE
Leukocytes, UA: NEGATIVE
Odor: NORMAL
Protein, UA: NEGATIVE
Spec Grav, UA: 1.01 (ref 1.010–1.025)
Urobilinogen, UA: NEGATIVE U/dL — AB
pH, UA: 6.5 (ref 5.0–8.0)

## 2023-10-19 LAB — CBC WITH DIFFERENTIAL/PLATELET
Basophils Absolute: 0 10*3/uL (ref 0.0–0.1)
Basophils Relative: 0.4 % (ref 0.0–3.0)
Eosinophils Absolute: 0.2 10*3/uL (ref 0.0–0.7)
Eosinophils Relative: 1.9 % (ref 0.0–5.0)
HCT: 38.4 % (ref 36.0–46.0)
Hemoglobin: 12.3 g/dL (ref 12.0–15.0)
Lymphocytes Relative: 20.8 % (ref 12.0–46.0)
Lymphs Abs: 2.1 10*3/uL (ref 0.7–4.0)
MCHC: 32.1 g/dL (ref 30.0–36.0)
MCV: 83.3 fL (ref 78.0–100.0)
Monocytes Absolute: 0.8 10*3/uL (ref 0.1–1.0)
Monocytes Relative: 7.8 % (ref 3.0–12.0)
Neutro Abs: 6.9 10*3/uL (ref 1.4–7.7)
Neutrophils Relative %: 69.1 % (ref 43.0–77.0)
Platelets: 492 10*3/uL — ABNORMAL HIGH (ref 150.0–400.0)
RBC: 4.61 Mil/uL (ref 3.87–5.11)
RDW: 15.8 % — ABNORMAL HIGH (ref 11.5–15.5)
WBC: 9.9 10*3/uL (ref 4.0–10.5)

## 2023-10-19 LAB — COMPREHENSIVE METABOLIC PANEL
ALT: 11 U/L (ref 0–35)
AST: 13 U/L (ref 0–37)
Albumin: 4.4 g/dL (ref 3.5–5.2)
Alkaline Phosphatase: 87 U/L (ref 39–117)
BUN: 10 mg/dL (ref 6–23)
CO2: 28 meq/L (ref 19–32)
Calcium: 9.7 mg/dL (ref 8.4–10.5)
Chloride: 100 meq/L (ref 96–112)
Creatinine, Ser: 0.7 mg/dL (ref 0.40–1.20)
GFR: 111.03 mL/min (ref >60.00)
Glucose, Bld: 64 mg/dL — ABNORMAL LOW (ref 70–99)
Potassium: 4.6 meq/L (ref 3.5–5.1)
Sodium: 136 meq/L (ref 135–145)
Total Bilirubin: 0.4 mg/dL (ref 0.2–1.2)
Total Protein: 7.8 g/dL (ref 6.0–8.3)

## 2023-10-19 LAB — LIPASE: Lipase: 39 U/L (ref 11.0–59.0)

## 2023-10-19 MED ORDER — FAMOTIDINE 20 MG PO TABS: 20.0000 mg | ORAL_TABLET | Freq: Two times a day (BID) | ORAL | 0 refills | Status: DC

## 2023-10-19 MED ORDER — CETIRIZINE HCL 10 MG PO TABS: 10.0000 mg | ORAL_TABLET | Freq: Every day | ORAL | 0 refills | Status: DC

## 2023-10-19 MED ORDER — DOCUSATE SODIUM 100 MG PO CAPS: 100.0000 mg | ORAL_CAPSULE | Freq: Two times a day (BID) | ORAL | Status: DC

## 2023-10-19 NOTE — Progress Notes (Signed)
Established Patient Visit  Patient: Kimberly Guerra   DOB: [birth year]   37 y.o. Female  MRN: [id number] Visit Date: 10/19/2023  Subjective:    Chief Complaint  Patient presents with   Acute Visit    PT is here for abdominal pain that will radiate to chest for 5 days. PT took Tums but it didn't help symptoms became worse over the weekend. PT also voiced frequent hives that becomes worse at night. Hives will come and go. RX refills are needed.    HPI Primary hypertension Losartan discontinued by GYN per patient Current use of metoprolol 100mg  BID BP Readings from Last 3 Encounters:  10/19/23 131/77  08/05/23 112/63  07/22/23 127/86     Hives onset 07/2023, generalized, intermittent, Resolves with benadryl. No facial rash or swelling Unknown trigger No new medication or detergent or personal products.  Sent famotidine and zyrtec Entered referral to allergist  Dyspepsia Onset 5days ago, associated with epigastric pain and constipation, waxing and waning, no fever, no blood in stool. pain ranges from at 6-10. No improvement with TUMs.  Gastritis vs cholecystitis vs constipation? Advised to go to ED for rapid evaluation. She declined. Maintain Bland diet Check CBC, UA, CMP, lipase and H pylori Normal POC UA Send for ABDOMEN US Start famotidine 20mg  BID Advised to go to ED is symptoms worsen  Reviewed medical, surgical, and social history today  Medications: Outpatient Medications Prior to Visit  Medication Sig Note   albuterol (VENTOLIN HFA) 108 (90 Base) MCG/ACT inhaler Inhale 2 puffs into the lungs every 6 (six) hours as needed for wheezing or shortness of breath. 10/19/2023: Refills  are needed    eletriptan (RELPAX) 20 MG tablet TAKE 1 TABLET (20 MG TOTAL) BY MOUTH DAILY AS NEEDED FOR MIGRAINE OR HEADACHE. MAY REPEAT IN 2 HOURS IF HEADACHE PERSISTS OR RECURS. NO MORE THAN 80MG  IN 24HRS    fluconazole (DIFLUCAN) 150 MG tablet Take 1 tablet (150 mg  total) by mouth daily.    folic acid (FOLVITE) 1 MG tablet Take 2 tablets (2 mg total) by mouth daily.    metoprolol succinate (TOPROL-XL) 25 MG 24 hr tablet Take 25 mg by mouth 2 (two) times daily.    metoprolol tartrate 75 MG TABS Take 1 tablet (75 mg total) by mouth 2 (two) times daily.    Multiple Vitamins-Minerals (WOMENS MULTIVITAMIN PLUS) TABS Take by mouth daily.    valACYclovir (VALTREX) 1000 MG tablet Take 1,000 mg by mouth.    [DISCONTINUED] cyclobenzaprine (FLEXERIL) 10 MG tablet Take 1 tablet (10 mg total) by mouth 2 (two) times daily as needed for muscle spasms. 10/19/2023: Refills needed    [DISCONTINUED] fluticasone (FLONASE) 50 MCG/ACT nasal spray Place 2 sprays into both nostrils daily. (Patient not taking: Reported on 10/19/2023)    [DISCONTINUED] losartan (COZAAR) 25 MG tablet Take 1 tablet (25 mg total) by mouth daily. (Patient not taking: Reported on 10/19/2023)    No facility-administered medications prior to visit.   Reviewed past medical and social history.   ROS per HPI above      Objective:  BP 131/77 (BP Location: Left Arm, Patient Position: Sitting, Cuff Size: Normal)   Pulse 71   Temp 98.3 F (36.8 C) (Temporal)   Resp 18   Wt 177 lb (80.3 kg)   LMP 10/04/2023 (Exact Date)   SpO2 99%   BMI 36.99 kg/m      Physical Exam  Vitals and nursing note reviewed.  Constitutional:      General: She is not in acute distress. Cardiovascular:     Rate and Rhythm: Normal rate.     Pulses: Normal pulses.  Pulmonary:     Effort: Pulmonary effort is normal.  Abdominal:     General: There is distension.     Tenderness: There is abdominal tenderness. There is guarding. There is no right CVA tenderness or left CVA tenderness.  Neurological:     Mental Status: She is alert and oriented to person, place, and time.     Results for orders placed or performed in visit on 10/19/23  POCT urinalysis dipstick  Result Value Ref Range   Color, UA yellow    Clarity, UA  clear    Glucose, UA Negative Negative   Bilirubin, UA negative    Ketones, UA negative    Spec Grav, UA 1.010 1.010 - 1.025   Blood, UA negative    pH, UA 6.5 5.0 - 8.0   Protein, UA Negative Negative   Urobilinogen, UA negative (A) 0.2 or 1.0 E.U./dL   Nitrite, UA posituve    Leukocytes, UA Negative Negative   Appearance clear    Odor normal       Assessment & Plan:    Problem List Items Addressed This Visit     Dyspepsia    Onset 5days ago, associated with epigastric pain and constipation, waxing and waning, no fever, no blood in stool. pain ranges from at 6-10. No improvement with TUMs.  Gastritis vs cholecystitis vs constipation? Advised to go to ED for rapid evaluation. She declined. Maintain Bland diet Check CBC, UA, CMP, lipase and H pylori Normal POC UA Send for ABDOMEN US Start famotidine 20mg  BID Advised to go to ED is symptoms worsen      Relevant Medications   famotidine (PEPCID) 20 MG tablet   cetirizine (ZYRTEC) 10 MG tablet   docusate sodium (COLACE) 100 MG capsule   Other Relevant Orders   CBC with Differential/Platelet   Comprehensive metabolic panel   Lipase   H. pylori breath test   POCT urinalysis dipstick (Completed)   US Abdomen Limited RUQ (LIVER/GB)   Hives - Primary    onset 07/2023, generalized, intermittent, Resolves with benadryl. No facial rash or swelling Unknown trigger No new medication or detergent or personal products.  Sent famotidine and zyrtec Entered referral to allergist      Relevant Medications   cetirizine (ZYRTEC) 10 MG tablet   Other Relevant Orders   Ambulatory referral to Allergy   Primary hypertension    Losartan discontinued by GYN per patient Current use of metoprolol 100mg  BID BP Readings from Last 3 Encounters:  10/19/23 131/77  08/05/23 112/63  07/22/23 127/86         Relevant Medications   metoprolol succinate (TOPROL-XL) 25 MG 24 hr tablet   Other Visit Diagnoses     Epigastric pain        Relevant Medications   docusate sodium (COLACE) 100 MG capsule   Other Relevant Orders   CBC with Differential/Platelet   Comprehensive metabolic panel   Lipase   H. pylori breath test   US Abdomen Limited RUQ (LIVER/GB)      Return if symptoms worsen or fail to improve.     Alysia Penna, NP

## 2023-10-19 NOTE — Assessment & Plan Note (Signed)
Losartan discontinued by GYN per patient Current use of metoprolol 100mg  BID BP Readings from Last 3 Encounters:  10/19/23 131/77  08/05/23 112/63  07/22/23 127/86

## 2023-10-19 NOTE — Assessment & Plan Note (Signed)
onset 07/2023, generalized, intermittent, Resolves with benadryl. No facial rash or swelling Unknown trigger No new medication or detergent or personal products.  Sent famotidine and zyrtec Entered referral to allergist

## 2023-10-19 NOTE — Patient Instructions (Addendum)
Go to lab Continue Heart healthy diet and daily exercise. Go to ED if symptoms worsen  Bland Diet A bland diet may consist of soft foods or foods that are not high in fat or are not greasy, acidic, or spicy. Avoiding certain foods may cause less irritation to your mouth, throat, stomach, or gastrointestinal tract. Avoiding certain foods may make you feel better. Everyone's tolerances are different. A bland diet should be based on what you can tolerate and what may cause discomfort. What is my plan? Your health care provider or dietitian may recommend specific changes to your diet to treat your symptoms. These changes may include: Eating small meals frequently. Cooking food until it is soft enough to chew easily. Taking the time to chew your food thoroughly, so it is easy to swallow and digest. Avoiding foods that cause you discomfort. These may include spicy food, fried food, greasy foods, hard-to-chew foods, or citrus fruits and juices. Drinking slowly. What are tips for following this plan? Reading food labels To reduce fiber intake, look for food labels that say "whole," such as whole wheat or whole grain. Shopping Avoid food items that may have nuts or seeds. Avoid vegetables that may make you gassy or have a tough texture, such as broccoli, cauliflower, or corn. Cooking Cook foods thoroughly so they have a soft texture. Meal planning Make sure you include foods from all food groups to eat a balanced diet. Eat a variety of types of foods. Eat foods and drink beverages that do not cause you discomfort. These may include soups and broths with cooked meats, pasta, and vegetables. Lifestyle Sit up after meals, avoid tight clothing, and take time to eat and chew your food slowly. Ask your health care provider whether you should take dietary supplements. General information Mildly season your foods. Some seasonings, such as cayenne pepper, vinegar, or hot sauce, may cause irritation. The  foods, beverages, or seasonings to avoid should be based on individual tolerance. What foods should I eat? Fruits Canned or cooked fruit such as peaches, pears, or applesauce. Bananas. Vegetables Well-cooked vegetables. Canned or cooked vegetables such as carrots, green beans, beets, or spinach. Mashed or boiled potatoes. Grains  Hot cereals, such as cream of wheat and processed oatmeal. Rice. Bread, crackers, pasta, or tortillas made from refined white flour. Meats and other proteins  Eggs. Creamy peanut butter or other nut butters. Lean, well-cooked tender meats, such as beef, pork, chicken, or fish. Dairy Low-fat dairy products such as milk, cottage cheese, or yogurt. Beverages  Water. Herbal tea. Apple juice. Fats and oils Mild salad dressings. Canola or olive oil. Sweets and desserts Low-fat pudding, custard, or ice cream. Fruit gelatin. The items listed above may not be a complete list of foods and beverages you can eat. Contact a dietitian for more information. What foods should I avoid? Fruits Citrus fruits, such as oranges and grapefruit. Fruits with a stringy texture. Fruits that have lots of seeds, such as kiwi or strawberries. Dried fruits. Vegetables Raw, uncooked vegetables. Salads. Grains Whole grain breads, muffins, and cereals. Meats and other proteins Tough, fibrous meats. Highly seasoned meat such as corned beef, smoked meats, or fish. Processed high-fat meats such as brats, hot dogs, or sausage. Dairy Full-fat dairy foods such as ice cream and cheese. Beverages Caffeinated drinks. Alcohol. Seasonings and condiments Strongly flavored seasonings or condiments. Hot sauce. Salsa. Other foods Spicy foods. Fried or greasy foods. Sour foods, such as pickled or fermented foods like sauerkraut. Foods high in  fiber. The items listed above may not be a complete list of foods and beverages you should avoid. Contact a dietitian for more information. Summary A bland  diet should be based on individual tolerance. It may consist of foods that are soft textured and do not have a lot of fat, fiber, acid, or seasonings. A bland diet may be recommended because avoiding certain foods, beverages, or spices may make you feel better. This information is not intended to replace advice given to you by your health care provider. Make sure you discuss any questions you have with your health care provider. Document Revised: 11/03/2021 Document Reviewed: 11/03/2021 Elsevier Patient Education  2024 [employer].

## 2023-10-19 NOTE — Assessment & Plan Note (Addendum)
Onset 5days ago, associated with epigastric pain and constipation, waxing and waning, no fever, no blood in stool. pain ranges from at 6-10. No improvement with TUMs.  Gastritis vs cholecystitis vs constipation? Advised to go to ED for rapid evaluation. She declined. Maintain Bland diet Check CBC, UA, CMP, lipase and H pylori Normal POC UA Send for ABDOMEN US Start famotidine 20mg  BID Advised to go to ED is symptoms worsen

## 2023-10-20 ENCOUNTER — Ambulatory Visit
Admission: RE | Admit: 2023-10-20 | Discharge: 2023-10-20 | Disposition: A | Discharge status: Home / Self Care | Payer: BC Managed Care – PPO | Source: Ambulatory Visit | Attending: Nurse Practitioner | Admitting: Nurse Practitioner

## 2023-10-20 DIAGNOSIS — R1013 Epigastric pain: Secondary | ICD-10-CM

## 2023-10-22 ENCOUNTER — Encounter: Payer: Self-pay | Admitting: Nurse Practitioner

## 2023-10-22 DIAGNOSIS — K5900 Constipation, unspecified: Secondary | ICD-10-CM

## 2023-10-22 DIAGNOSIS — R1013 Epigastric pain: Secondary | ICD-10-CM

## 2023-10-22 LAB — H. PYLORI BREATH TEST: H. pylori Breath Test: NOT DETECTED

## 2023-10-26 ENCOUNTER — Other Ambulatory Visit: Payer: Self-pay | Admitting: Nurse Practitioner

## 2023-10-26 DIAGNOSIS — R1013 Epigastric pain: Secondary | ICD-10-CM

## 2023-10-28 ENCOUNTER — Encounter: Payer: Self-pay | Admitting: Medical Oncology

## 2023-10-28 ENCOUNTER — Inpatient Hospital Stay: Payer: BC Managed Care – PPO | Admitting: Medical Oncology

## 2023-10-28 ENCOUNTER — Inpatient Hospital Stay: Payer: BC Managed Care – PPO | Attending: Medical Oncology

## 2023-10-28 VITALS — BP 125/86 | HR 81 | Temp 98.1°F | Resp 17 | Wt 174.1 lb

## 2023-10-28 DIAGNOSIS — D573 Sickle-cell trait: Secondary | ICD-10-CM | POA: Diagnosis not present

## 2023-10-28 DIAGNOSIS — E559 Vitamin D deficiency, unspecified: Secondary | ICD-10-CM | POA: Diagnosis not present

## 2023-10-28 DIAGNOSIS — R5383 Other fatigue: Secondary | ICD-10-CM

## 2023-10-28 DIAGNOSIS — N921 Excessive and frequent menstruation with irregular cycle: Secondary | ICD-10-CM

## 2023-10-28 DIAGNOSIS — D500 Iron deficiency anemia secondary to blood loss (chronic): Secondary | ICD-10-CM | POA: Insufficient documentation

## 2023-10-28 LAB — CMP (CANCER CENTER ONLY)
ALT: 9 U/L (ref 0–44)
AST: 13 U/L — ABNORMAL LOW (ref 15–41)
Albumin: 4.4 g/dL (ref 3.5–5.0)
Alkaline Phosphatase: 81 U/L (ref 38–126)
Anion gap: 8 (ref 5–15)
BUN: 9 mg/dL (ref 6–20)
CO2: 25 mmol/L (ref 22–32)
Calcium: 9.4 mg/dL (ref 8.9–10.3)
Chloride: 104 mmol/L (ref 98–111)
Creatinine: 0.66 mg/dL (ref 0.44–1.00)
GFR, Estimated: >60 mL/min (ref >60)
Glucose, Bld: 105 mg/dL — ABNORMAL HIGH (ref 70–99)
Potassium: 4 mmol/L (ref 3.5–5.1)
Sodium: 137 mmol/L (ref 135–145)
Total Bilirubin: 0.4 mg/dL (ref 0.3–1.2)
Total Protein: 7.7 g/dL (ref 6.5–8.1)

## 2023-10-28 LAB — CBC WITH DIFFERENTIAL (CANCER CENTER ONLY)
Abs Immature Granulocytes: 0.04 10*3/uL (ref 0.00–0.07)
Basophils Absolute: 0 10*3/uL (ref 0.0–0.1)
Basophils Relative: 0 %
Eosinophils Absolute: 0.1 10*3/uL (ref 0.0–0.5)
Eosinophils Relative: 1 %
HCT: 35.8 % — ABNORMAL LOW (ref 36.0–46.0)
Hemoglobin: 11.9 g/dL — ABNORMAL LOW (ref 12.0–15.0)
Immature Granulocytes: 1 %
Lymphocytes Relative: 20 %
Lymphs Abs: 1.7 10*3/uL (ref 0.7–4.0)
MCH: 26.6 pg (ref 26.0–34.0)
MCHC: 33.2 g/dL (ref 30.0–36.0)
MCV: 80.1 fL (ref 80.0–100.0)
Monocytes Absolute: 0.4 10*3/uL (ref 0.1–1.0)
Monocytes Relative: 5 %
Neutro Abs: 6.3 10*3/uL (ref 1.7–7.7)
Neutrophils Relative %: 73 %
Platelet Count: 380 10*3/uL (ref 150–400)
RBC: 4.47 MIL/uL (ref 3.87–5.11)
RDW: 14.2 % (ref 11.5–15.5)
WBC Count: 8.6 10*3/uL (ref 4.0–10.5)
nRBC: 0 % (ref 0.0–0.2)

## 2023-10-28 LAB — VITAMIN D 25 HYDROXY (VIT D DEFICIENCY, FRACTURES): Vit D, 25-Hydroxy: 48.86 ng/mL (ref 30–100)

## 2023-10-28 LAB — IRON AND IRON BINDING CAPACITY (CC-WL,HP ONLY)
Iron: 45 ug/dL (ref 28–170)
Saturation Ratios: 13 % (ref 10.4–31.8)
TIBC: 335 ug/dL (ref 250–450)
UIBC: 290 ug/dL (ref 148–442)

## 2023-10-28 LAB — RETICULOCYTES
Immature Retic Fract: 14.4 % (ref 2.3–15.9)
RBC.: 4.42 MIL/uL (ref 3.87–5.11)
Retic Count, Absolute: 65 10*3/uL (ref 19.0–186.0)
Retic Ct Pct: 1.5 % (ref 0.4–3.1)

## 2023-10-28 LAB — FERRITIN: Ferritin: 235 ng/mL (ref 11–307)

## 2023-10-28 NOTE — Progress Notes (Signed)
Hematology and Oncology Follow Up Visit  Kimberly Guerra [id number] [birth year] 37 y.o. 10/28/2023   Principle Diagnosis:  Iron deficiency anemia-menometrorrhagia Sickle cell trait Vitamin D Deficiency   Current Therapy:   IV iron-Venofer given on 10/22/2022  Folic acid 2 mg p.o. daily OTC Vitamin D 2,000 I.U. Daily      Interim History:  Kimberly Guerra is back for follow-up.    Today she reports that she is fatigued. She is still taking OTC vitamin D. Still taking folic acid supplementation. She questions if her iron level is low.   She has had no change in bowel or bladder habits. No bleeding or clotting events other than her menstrual cycles.   Overall, I would say that her performance status is ECOG 1.   Wt Readings from Last 3 Encounters:  10/28/23 174 lb 1.3 oz (79 kg)  10/19/23 177 lb (80.3 kg)  07/22/23 171 lb (77.6 kg)   Medications:  Current Outpatient Medications:    albuterol (VENTOLIN HFA) 108 (90 Base) MCG/ACT inhaler, Inhale 2 puffs into the lungs every 6 (six) hours as needed for wheezing or shortness of breath., Disp: 18 g, Rfl: 1   cetirizine (ZYRTEC) 10 MG tablet, Take 1 tablet (10 mg total) by mouth at bedtime., Disp: 30 tablet, Rfl: 0   docusate sodium (COLACE) 100 MG capsule, Take 1 capsule (100 mg total) by mouth 2 (two) times daily., Disp: , Rfl:    eletriptan (RELPAX) 20 MG tablet, TAKE 1 TABLET (20 MG TOTAL) BY MOUTH DAILY AS NEEDED FOR MIGRAINE OR HEADACHE. MAY REPEAT IN 2 HOURS IF HEADACHE PERSISTS OR RECURS. NO MORE THAN 80MG  IN 24HRS, Disp: 12 tablet, Rfl: 0   famotidine (PEPCID) 20 MG tablet, Take 1 tablet (20 mg total) by mouth 2 (two) times daily., Disp: 30 tablet, Rfl: 0   folic acid (FOLVITE) 1 MG tablet, Take 2 tablets (2 mg total) by mouth daily., Disp: 180 tablet, Rfl: 6   metoprolol succinate (TOPROL-XL) 25 MG 24 hr tablet, Take 25 mg by mouth 2 (two) times daily., Disp: , Rfl:    metoprolol tartrate 75 MG TABS, Take 1 tablet (75 mg  total) by mouth 2 (two) times daily., Disp: 60 tablet, Rfl: 3   Multiple Vitamins-Minerals (WOMENS MULTIVITAMIN PLUS) TABS, Take by mouth daily., Disp: , Rfl:    valACYclovir (VALTREX) 1000 MG tablet, Take 1,000 mg by mouth., Disp: , Rfl:    fluconazole (DIFLUCAN) 150 MG tablet, Take 1 tablet (150 mg total) by mouth daily. (Patient not taking: Reported on 10/28/2023), Disp: 1 tablet, Rfl: 0  Allergies:  Allergies  Allergen Reactions   Procardia Xl [Nifedipine Er] Hives, Itching and Other (See Comments)    Severe headache   Topamax [Topiramate] Palpitations    Heart palpitations    Past Medical History, Surgical history, Social history, and Family History were reviewed and updated.  Review of Systems: Review of Systems  Constitutional: Negative.   HENT:  Negative.    Eyes: Negative.   Respiratory: Negative.    Cardiovascular: Negative.   Gastrointestinal: Negative.   Endocrine: Negative.   Genitourinary: Negative.    Musculoskeletal: Negative.   Skin: Negative.   Neurological: Negative.   Hematological: Negative.   Psychiatric/Behavioral: Negative.      Physical Exam:  weight is 174 lb 1.3 oz (79 kg). Her oral temperature is 98.1 F (36.7 C). Her blood pressure is 125/86 and her pulse is 81. Her respiration is 17 and oxygen saturation is 100%.  Wt Readings from Last 3 Encounters:  10/28/23 174 lb 1.3 oz (79 kg)  10/19/23 177 lb (80.3 kg)  07/22/23 171 lb (77.6 kg)    Physical Exam Vitals reviewed.  HENT:     Head: Normocephalic and atraumatic.  Eyes:     Pupils: Pupils are equal, round, and reactive to light.  Cardiovascular:     Rate and Rhythm: Normal rate and regular rhythm.     Heart sounds: Normal heart sounds.  Pulmonary:     Effort: Pulmonary effort is normal.     Breath sounds: Normal breath sounds.  Abdominal:     General: Bowel sounds are normal.     Palpations: Abdomen is soft.  Musculoskeletal:        General: No tenderness or deformity. Normal  range of motion.     Cervical back: Normal range of motion.  Lymphadenopathy:     Cervical: No cervical adenopathy.  Skin:    General: Skin is warm and dry.     Findings: No erythema or rash.  Neurological:     Mental Status: She is alert and oriented to person, place, and time.  Psychiatric:        Behavior: Behavior normal.        Thought Content: Thought content normal.        Judgment: Judgment normal.      Lab Results  Component Value Date   WBC 8.6 10/28/2023   HGB 11.9 (L) 10/28/2023   HCT 35.8 (L) 10/28/2023   MCV 80.1 10/28/2023   PLT 380 10/28/2023     Chemistry      Component Value Date/Time   NA 136 10/19/2023 1434   K 4.6 10/19/2023 1434   CL 100 10/19/2023 1434   CO2 28 10/19/2023 1434   BUN 10 10/19/2023 1434   CREATININE 0.70 10/19/2023 1434   CREATININE 0.61 07/22/2023 0950   CREATININE 0.69 12/06/2013 1631      Component Value Date/Time   CALCIUM 9.7 10/19/2023 1434   ALKPHOS 87 10/19/2023 1434   AST 13 10/19/2023 1434   AST 11 (L) 07/22/2023 0950   ALT 11 10/19/2023 1434   ALT 9 07/22/2023 0950   BILITOT 0.4 10/19/2023 1434   BILITOT 0.3 07/22/2023 0950     Encounter Diagnoses  Name Primary?   Iron deficiency anemia due to chronic blood loss Yes   Menometrorrhagia    Sickle cell trait (HCC)    Other fatigue     Impression and Plan: Kimberly Guerra is a very charming 37 year old African-American female with IDA secondary to menometrorrhagia and sickle cell trait. She currently gets IV iron as needed.   Iron studied pending. Hgb 11.9, MCV now 80.1, platelets 380. Reticulocytes 14.4%. Suspect she may need iron.   We will plan to see her back in about 3 months.   Disposition RTC 3 months APP, labs (CBC, iron, ferritin)  Rushie Chestnut, PA-C 10/31/202410:05 AM

## 2023-11-10 ENCOUNTER — Other Ambulatory Visit: Payer: Self-pay | Admitting: Nurse Practitioner

## 2023-11-10 ENCOUNTER — Other Ambulatory Visit: Payer: Self-pay | Admitting: Hematology & Oncology

## 2023-11-10 DIAGNOSIS — R1013 Epigastric pain: Secondary | ICD-10-CM

## 2023-11-10 DIAGNOSIS — G43709 Chronic migraine without aura, not intractable, without status migrainosus: Secondary | ICD-10-CM

## 2023-11-10 DIAGNOSIS — L509 Urticaria, unspecified: Secondary | ICD-10-CM

## 2023-11-10 NOTE — Addendum Note (Signed)
Addended by: Alysia Penna L on: 11/10/2023 02:44 PM   Modules accepted: Orders

## 2023-11-14 ENCOUNTER — Other Ambulatory Visit: Payer: Self-pay | Admitting: Nurse Practitioner

## 2023-11-14 DIAGNOSIS — L509 Urticaria, unspecified: Secondary | ICD-10-CM

## 2023-11-14 DIAGNOSIS — R1013 Epigastric pain: Secondary | ICD-10-CM

## 2023-12-08 ENCOUNTER — Ambulatory Visit: Payer: Self-pay | Admitting: Internal Medicine

## 2023-12-20 ENCOUNTER — Other Ambulatory Visit: Payer: Self-pay | Admitting: Nurse Practitioner

## 2023-12-20 DIAGNOSIS — R1013 Epigastric pain: Secondary | ICD-10-CM

## 2023-12-20 DIAGNOSIS — L509 Urticaria, unspecified: Secondary | ICD-10-CM

## 2024-01-24 ENCOUNTER — Encounter: Payer: Self-pay | Admitting: Family

## 2024-01-24 ENCOUNTER — Ambulatory Visit: Payer: BC Managed Care – PPO | Admitting: Internal Medicine

## 2024-01-24 ENCOUNTER — Encounter: Payer: Self-pay | Admitting: Internal Medicine

## 2024-01-24 VITALS — BP 138/80 | HR 79 | Temp 97.7°F | Resp 17 | Ht <= 58 in | Wt 175.6 lb

## 2024-01-24 DIAGNOSIS — D508 Other iron deficiency anemias: Secondary | ICD-10-CM | POA: Diagnosis not present

## 2024-01-24 DIAGNOSIS — I10 Essential (primary) hypertension: Secondary | ICD-10-CM

## 2024-01-24 DIAGNOSIS — L501 Idiopathic urticaria: Secondary | ICD-10-CM | POA: Diagnosis not present

## 2024-01-24 DIAGNOSIS — J4520 Mild intermittent asthma, uncomplicated: Secondary | ICD-10-CM | POA: Diagnosis not present

## 2024-01-24 MED ORDER — AIRSUPRA 90-80 MCG/ACT IN AERO: 2.0000 | INHALATION_SPRAY | RESPIRATORY_TRACT | 5 refills | Status: DC | PRN

## 2024-01-24 MED ORDER — FEXOFENADINE HCL 180 MG PO TABS: 180.0000 mg | ORAL_TABLET | Freq: Every day | ORAL | 5 refills | Status: DC

## 2024-01-24 MED ORDER — CETIRIZINE HCL 10 MG PO TABS: 10.0000 mg | ORAL_TABLET | Freq: Every day | ORAL | 1 refills | Status: DC

## 2024-01-24 MED ORDER — FAMOTIDINE 20 MG PO TABS: 20.0000 mg | ORAL_TABLET | Freq: Two times a day (BID) | ORAL | 1 refills | Status: AC

## 2024-01-24 MED ORDER — ALBUTEROL SULFATE HFA 108 (90 BASE) MCG/ACT IN AERS: 2.0000 | INHALATION_SPRAY | Freq: Four times a day (QID) | RESPIRATORY_TRACT | 1 refills | Status: DC | PRN

## 2024-01-24 NOTE — Patient Instructions (Addendum)
Chronic Idiopathic Urticaria Daily hives for over two months, lasting approximately 30 minutes to an hour each episode. Hives are exacerbated by stress and scratching. Dermatographism noted. Currently on Zyrtec at night, which provides some relief but does not fully control symptoms. -Increase Zyrtec 10mg  to twice daily. -Add Allegra (fexofenadine)180mg   in the morning if increased Zyrtec causes drowsiness. -Continue Pepcid 20mg  daily  -Order labs to check for underlying diseases associated with chronic hives (IgE, CRP, anti-TPO, TSH, alpha gal) -Consider Xolair if high dose antihistamines are not effective. -Follow-up in 4 weeks to assess response to treatment.  Asthma Diagnosed in 2010, no recent exacerbations or hospitalizations. Triggered by change in weather and severe colds. Currently managed with albuterol as needed. Breathing test: normal, but did improve with albuerol  - Start air supra 2 puffs every 4 hours as needed for cough, wheeze, dyspnea  - Use a spacer with all inhalers - please keep track of how often you are needing rescue inhaler Albuterol (Proair/Ventolin) as this will help guide future management - Asthma is not controlled if:  - Symptoms are occurring >2 times a week  during the day  OR  - >2 times a month nighttime awakenings  - Please call the clinic to schedule a follow up if these symptoms arise    Follow up: 4 weeks   Thank you so much for letting me partake in your care today.  Don't hesitate to reach out if you have any additional concerns!  Ferol Luz, MD  [facility]- [place], [place]

## 2024-01-24 NOTE — Progress Notes (Signed)
NEW PATIENT Date of Service/Encounter:  01/24/24 Referring provider: Anne Ng, NP Primary care provider: Anne Ng, NP  Subjective:  Kimberly Guerra is a 38 y.o. female with SS trait, vitamin D and iron deficiency presenting today for evaluation of urticaria, asthma, rhinitis  History obtained from: chart review and patient.   Discussed the use of AI scribe software for clinical note transcription with the patient, who gave verbal consent to proceed.  History of Present Illness   The patient, with a known history of asthma, presents with a chief complaint of recurrent hives for approximately two months. The hives are reported to occur daily, lasting for about 30 minutes to an hour each time. The patient notes that the hives seem to be exacerbated by stress and physical stimuli such as scratching. The patient also reports a sensation of warmth prior to the appearance of the hives. The hives have been documented in various locations, including the face, neck, stomach, and arms. Associated with dermatographism.  No association with NSAID, opiates, exercise, vibration, cold, heat, hot water, pressure  The patient has been taking Zyrtec at night, which seems to alleviate the symptoms and aid in sleep. However, the hives often reappear during the day. The patient denies any correlation between the hives and the intake of certain foods or medications.  Regarding the patient's asthma, it was diagnosed in 2010 and is managed with albuterol. The patient reports that asthma attacks are usually triggered by changes in weather or severe colds, but there have been no recent attacks or hospitalizations.  The patient also has a history of severe iron deficiency anemia and is currently receiving iron infusions. The patient denies any known food allergies, drug allergies, or stinging insect allergies. However, she reports previous adverse reactions to Procardia and Topamax.           Other allergy screening: Asthma: yes Rhino conjunctivitis: no Food allergy: no Medication allergy: no Hymenoptera allergy: no Urticaria: yes Eczema:no History of recurrent infections suggestive of immunodeficency: no Vaccinations are up to date.   Past Medical History: Past Medical History:  Diagnosis Date   Anemia    Asthma    prn inhaler   History of gestational hypertension    Irregular menstrual cycle    Menometrorrhagia 03/27/2022   Menorrhagia    Migraines    Seasonal allergies    Sickle cell trait (HCC)    Sickle cell trait (HCC) 06/11/2022   Medication List:  Current Outpatient Medications  Medication Sig Dispense Refill   Albuterol-Budesonide (AIRSUPRA) 90-80 MCG/ACT AERO Inhale 2 puffs into the lungs every 4 (four) hours as needed. 10.7 g 5   docusate sodium (COLACE) 100 MG capsule Take 1 capsule (100 mg total) by mouth 2 (two) times daily.     eletriptan (RELPAX) 20 MG tablet TAKE 1 TABLET (20 MG TOTAL) BY MOUTH DAILY AS NEEDED FOR MIGRAINE OR HEADACHE. MAY REPEAT IN 2 HOURS IF HEADACHE PERSISTS OR RECURS. NO MORE THAN 80MG  IN 24HRS 12 tablet 0   fexofenadine (ALLEGRA) 180 MG tablet Take 1 tablet (180 mg total) by mouth daily. 90 tablet 5   folic acid (FOLVITE) 1 MG tablet Take 2 tablets (2 mg total) by mouth daily. 180 tablet 6   metoprolol succinate (TOPROL-XL) 25 MG 24 hr tablet Take 25 mg by mouth 2 (two) times daily.     Metoprolol Tartrate 75 MG TABS TAKE 1 TABLET BY MOUTH TWICE A DAY 180 tablet 1   Multiple Vitamins-Minerals (  WOMENS MULTIVITAMIN PLUS) TABS Take by mouth daily.     valACYclovir (VALTREX) 1000 MG tablet Take 1,000 mg by mouth.     cetirizine (ZYRTEC) 10 MG tablet Take 1 tablet (10 mg total) by mouth daily. 90 tablet 1   famotidine (PEPCID) 20 MG tablet Take 1 tablet (20 mg total) by mouth 2 (two) times daily. 90 tablet 1   fluconazole (DIFLUCAN) 150 MG tablet Take 1 tablet (150 mg total) by mouth daily. (Patient not taking: Reported  on 01/24/2024) 1 tablet 0   No current facility-administered medications for this visit.   Known Allergies:  Allergies  Allergen Reactions   Procardia Xl [Nifedipine Er] Hives, Itching and Other (See Comments)    Severe headache   Topamax [Topiramate] Palpitations    Heart palpitations   Past Surgical History: Past Surgical History:  Procedure Laterality Date   CARPAL TUNNEL RELEASE Right 03/25/2015   Procedure:  ENDOSCOPIC CARPAL TUNNEL RELEASE;  Surgeon: Mack Hook, MD;  Location: [facility];  Service: Orthopedics;  Laterality: Right;   CESAREAN SECTION  01/02/2009   DORSAL COMPARTMENT RELEASE Right 03/25/2015   Procedure: RIGHT WRIST DEQUERVAIN RELEASE AND ;  Surgeon: Mack Hook, MD;  Location: [facility];  Service: Orthopedics;  Laterality: Right;   HYSTEROSCOPY WITH D & C N/A 06/24/2017   Procedure: DILATATION AND CURETTAGE /HYSTEROSCOPY  pelvic biopsy and ablation of endometriosis;  Surgeon: Geryl Rankins, MD;  Location: [facility];  Service: Gynecology;  Laterality: N/A;   LAPAROSCOPY N/A 06/24/2017   Procedure: LAPAROSCOPY DIAGNOSTIC;  Surgeon: Geryl Rankins, MD;  Location: [facility];  Service: Gynecology;  Laterality: N/A;   WISDOM TOOTH EXTRACTION     Family History: Family History  Problem Relation Age of Onset   Hypertension Mother    Asthma Father    Hypertension Father    Asthma Brother    Asthma Paternal Aunt    Diabetes Paternal Aunt    Cancer Maternal Grandfather        oral cancer   Diabetes Maternal Grandfather    Hypertension Maternal Grandfather    Colon cancer Neg Hx    Esophageal cancer Neg Hx    Rectal cancer Neg Hx    Stomach cancer Neg Hx    Social History: Lorelee lives [facility], hardwood in family room, laminate bedroom, 1 dog with access to bedroom, no roaches in house and bed is 2 feet off the floor, no tobacco exposure, works as a Publishing copy .   ROS:  All other systems  negative except as noted per HPI.  Objective:  Blood pressure 138/80, pulse 79, temperature 97.7 F (36.5 C), temperature source Temporal, resp. rate 17, height 4' 9.5" (1.461 m), weight 175 lb 9.6 oz (79.7 kg), SpO2 98%. Body mass index is 37.34 kg/m. Physical Exam:  General Appearance:  Alert, cooperative, no distress, appears stated age  Head:  Normocephalic, without obvious abnormality, atraumatic  Eyes:  Conjunctiva clear, EOM's intact  Ears EACs normal bilaterally  Nose: Nares normal, normal mucosa, no visible anterior polyps, and septum midline  Throat: Lips, tongue normal; teeth and gums normal, normal posterior oropharynx  Neck: Supple, symmetrical  Lungs:   clear to auscultation bilaterally, Respirations unlabored, no coughing  Heart:  regular rate and rhythm and no murmur, Appears well perfused  Extremities: No edema  Skin: Skin color, texture, turgor normal and no rashes or lesions on visualized portions of skin  Neurologic: No gross deficits   Diagnostics:  Spirometry:  Tracings reviewed. Her effort: Good reproducible efforts. FVC: 2.31L (pre), 2.84L  (post) FEV1: 2.10L, 99% predicted (pre), 2.37L, 111% predicted (post) FEV1/FVC ratio: 91 (pre), 83 (post) Interpretation: Spirometry consistent with normal pattern.  Please see scanned spirometry results for details.   Labs:  Lab Orders         Chronic Urticaria         Allergens, Zone 2         Tryptase         Thyroid Peroxidase Antibody         Alpha-Gal Panel         IgE       Assessment and Plan  Assessment and Plan    Chronic Idiopathic Urticaria Daily hives for over two months, lasting approximately 30 minutes to an hour each episode. Hives are exacerbated by stress and scratching. Dermatographism noted. Currently on Zyrtec at night, which provides some relief but does not fully control symptoms. -Increase Zyrtec 10mg  to twice daily. -Add Allegra (fexofenadine)180mg   in the morning if increased Zyrtec  causes drowsiness. -Continue Pepcid 20mg  daily  -Order labs to check for underlying diseases associated with chronic hives (IgE, CRP, anti-TPO, TSH, alpha gal) -Consider Xolair if high dose antihistamines are not effective. -Follow-up in 4 weeks to assess response to treatment.  Asthma Diagnosed in 2010, no recent exacerbations or hospitalizations. Triggered by change in weather and severe colds. Currently managed with albuterol as needed. Breathing test: normal, but did improve with albuerol  - Start air supra 2 puffs every 4 hours as needed for cough, wheeze, dyspnea  - Use a spacer with all inhalers - please keep track of how often you are needing rescue inhaler Albuterol (Proair/Ventolin) as this will help guide future management - Asthma is not controlled if:  - Symptoms are occurring >2 times a week  during the day  OR  - >2 times a month nighttime awakenings  - Please call the clinic to schedule a follow up if these symptoms arise    Follow up: 4 weeks    This note in its entirety was forwarded to the Provider who requested this consultation.  Other: reviewed spirometry technique and reviewed inhaler technique  Thank you for your kind referral. I appreciate the opportunity to take part in Daziah's care. Please do not hesitate to contact me with questions.  Sincerely,  Thank you so much for letting me partake in your care today.  Don't hesitate to reach out if you have any additional concerns! \ Ferol Luz, MD  [facility] [place], [place]

## 2024-01-27 ENCOUNTER — Inpatient Hospital Stay: Payer: BC Managed Care – PPO | Admitting: Medical Oncology

## 2024-01-27 ENCOUNTER — Inpatient Hospital Stay: Payer: BC Managed Care – PPO | Attending: Medical Oncology

## 2024-01-27 ENCOUNTER — Encounter: Payer: Self-pay | Admitting: Medical Oncology

## 2024-01-27 ENCOUNTER — Other Ambulatory Visit: Payer: Self-pay | Admitting: Medical Oncology

## 2024-01-27 VITALS — BP 137/77 | HR 76 | Temp 98.4°F | Resp 18 | Ht <= 58 in | Wt 174.1 lb

## 2024-01-27 DIAGNOSIS — E559 Vitamin D deficiency, unspecified: Secondary | ICD-10-CM | POA: Diagnosis not present

## 2024-01-27 DIAGNOSIS — R5383 Other fatigue: Secondary | ICD-10-CM

## 2024-01-27 DIAGNOSIS — N921 Excessive and frequent menstruation with irregular cycle: Secondary | ICD-10-CM

## 2024-01-27 DIAGNOSIS — D500 Iron deficiency anemia secondary to blood loss (chronic): Secondary | ICD-10-CM | POA: Diagnosis present

## 2024-01-27 DIAGNOSIS — D573 Sickle-cell trait: Secondary | ICD-10-CM | POA: Insufficient documentation

## 2024-01-27 LAB — CBC WITH DIFFERENTIAL (CANCER CENTER ONLY)
Abs Immature Granulocytes: 0.07 10*3/uL (ref 0.00–0.07)
Basophils Absolute: 0 10*3/uL (ref 0.0–0.1)
Basophils Relative: 0 %
Eosinophils Absolute: 0.2 10*3/uL (ref 0.0–0.5)
Eosinophils Relative: 2 %
HCT: 37.3 % (ref 36.0–46.0)
Hemoglobin: 12.4 g/dL (ref 12.0–15.0)
Immature Granulocytes: 1 %
Lymphocytes Relative: 17 %
Lymphs Abs: 1.7 10*3/uL (ref 0.7–4.0)
MCH: 26.8 pg (ref 26.0–34.0)
MCHC: 33.2 g/dL (ref 30.0–36.0)
MCV: 80.6 fL (ref 80.0–100.0)
Monocytes Absolute: 0.6 10*3/uL (ref 0.1–1.0)
Monocytes Relative: 6 %
Neutro Abs: 7.5 10*3/uL (ref 1.7–7.7)
Neutrophils Relative %: 74 %
Platelet Count: 477 10*3/uL — ABNORMAL HIGH (ref 150–400)
RBC: 4.63 MIL/uL (ref 3.87–5.11)
RDW: 14.1 % (ref 11.5–15.5)
WBC Count: 10.1 10*3/uL (ref 4.0–10.5)
nRBC: 0 % (ref 0.0–0.2)

## 2024-01-27 LAB — IRON AND IRON BINDING CAPACITY (CC-WL,HP ONLY)
Iron: 40 ug/dL (ref 28–170)
Saturation Ratios: 10 % — ABNORMAL LOW (ref 10.4–31.8)
TIBC: 402 ug/dL (ref 250–450)
UIBC: 362 ug/dL (ref 148–442)

## 2024-01-27 LAB — RETIC PANEL
Immature Retic Fract: 13 % (ref 2.3–15.9)
RBC.: 4.59 MIL/uL (ref 3.87–5.11)
Retic Count, Absolute: 71.1 10*3/uL (ref 19.0–186.0)
Retic Ct Pct: 1.6 % (ref 0.4–3.1)
Reticulocyte Hemoglobin: 29.9 pg (ref >27.9)

## 2024-01-27 LAB — FERRITIN: Ferritin: 197 ng/mL (ref 11–307)

## 2024-01-27 NOTE — Progress Notes (Signed)
Hematology and Oncology Follow Up Visit  Kimberly Guerra [id number] Feb 24, 1986 38 y.o. 01/27/2024   Principle Diagnosis:  Iron deficiency anemia-menometrorrhagia Sickle cell trait Vitamin D Deficiency   Current Therapy:   IV iron-Venofer given on 10/22/2022  Folic acid 2 mg p.o. daily OTC Vitamin D 2,000 I.U. Daily      Interim History:  Kimberly Guerra is back for follow-up.    Today she reports that she has been just ok. She has dealt with chronic hives, itching for the past few months. Suspected to be related to stress episodes. She has seen dermatology very recently and was started on an antihistamine. Blood work was obtained and is pending. Today she reports that symptoms are improved on the antihistamines.   In terms of her fatigue symptoms have continued. She is still taking vitamin D supplementation- OTC currently 1,000 I.U.. She has not noticed any improvement in her fatigue since taking this. Her last infusion of IV iron was on 08/12/2023.   There has been no bleeding to her knowledge other than her menstrual cycle which she is currently on: denies epistaxis, gingivitis, hemoptysis, hematemesis, hematuria, melena, excessive bruising, blood donation.   She has had no change in bowel or bladder habits. No unintentional night sweats or weight loss  Overall, I would say that her performance status is ECOG 1.   Wt Readings from Last 3 Encounters:  01/27/24 174 lb 1.9 oz (79 kg)  01/24/24 175 lb 9.6 oz (79.7 kg)  10/28/23 174 lb 1.3 oz (79 kg)   Medications:  Current Outpatient Medications:    cetirizine (ZYRTEC) 10 MG tablet, Take 1 tablet (10 mg total) by mouth daily., Disp: 90 tablet, Rfl: 1   docusate sodium (COLACE) 100 MG capsule, Take 1 capsule (100 mg total) by mouth 2 (two) times daily., Disp: , Rfl:    eletriptan (RELPAX) 20 MG tablet, TAKE 1 TABLET (20 MG TOTAL) BY MOUTH DAILY AS NEEDED FOR MIGRAINE OR HEADACHE. MAY REPEAT IN 2 HOURS IF HEADACHE PERSISTS OR  RECURS. NO MORE THAN 80MG  IN 24HRS, Disp: 12 tablet, Rfl: 0   famotidine (PEPCID) 20 MG tablet, Take 1 tablet (20 mg total) by mouth 2 (two) times daily., Disp: 90 tablet, Rfl: 1   fexofenadine (ALLEGRA) 180 MG tablet, Take 1 tablet (180 mg total) by mouth daily., Disp: 90 tablet, Rfl: 5   folic acid (FOLVITE) 1 MG tablet, Take 2 tablets (2 mg total) by mouth daily., Disp: 180 tablet, Rfl: 6   metoprolol succinate (TOPROL-XL) 25 MG 24 hr tablet, Take 25 mg by mouth 2 (two) times daily., Disp: , Rfl:    Metoprolol Tartrate 75 MG TABS, TAKE 1 TABLET BY MOUTH TWICE A DAY, Disp: 180 tablet, Rfl: 1   Multiple Vitamins-Minerals (WOMENS MULTIVITAMIN PLUS) TABS, Take by mouth daily., Disp: , Rfl:    valACYclovir (VALTREX) 1000 MG tablet, Take 1,000 mg by mouth., Disp: , Rfl:    Albuterol-Budesonide (AIRSUPRA) 90-80 MCG/ACT AERO, Inhale 2 puffs into the lungs every 4 (four) hours as needed. (Patient not taking: Reported on 01/27/2024), Disp: 10.7 g, Rfl: 5   fluconazole (DIFLUCAN) 150 MG tablet, Take 1 tablet (150 mg total) by mouth daily. (Patient not taking: Reported on 01/24/2024), Disp: 1 tablet, Rfl: 0  Allergies:  Allergies  Allergen Reactions   Procardia Xl [Nifedipine Er] Hives, Itching and Other (See Comments)    Severe headache   Topamax [Topiramate] Palpitations    Heart palpitations    Past Medical History, Surgical  history, Social history, and Family History were reviewed and updated.  Review of Systems: Review of Systems  Constitutional: Negative.   HENT:  Negative.    Eyes: Negative.   Respiratory: Negative.    Cardiovascular: Negative.   Gastrointestinal: Negative.   Endocrine: Negative.   Genitourinary: Negative.    Musculoskeletal: Negative.   Skin: Negative.   Neurological: Negative.   Hematological: Negative.   Psychiatric/Behavioral: Negative.      Physical Exam:  height is 4\' 9"  (1.448 m) and weight is 174 lb 1.9 oz (79 kg). Her oral temperature is 98.4 F (36.9  C). Her blood pressure is 137/77 and her pulse is 76. Her respiration is 18 and oxygen saturation is 100%.   Wt Readings from Last 3 Encounters:  01/27/24 174 lb 1.9 oz (79 kg)  01/24/24 175 lb 9.6 oz (79.7 kg)  10/28/23 174 lb 1.3 oz (79 kg)    Physical Exam Vitals reviewed.  HENT:     Head: Normocephalic and atraumatic.  Eyes:     Pupils: Pupils are equal, round, and reactive to light.  Cardiovascular:     Rate and Rhythm: Normal rate and regular rhythm.     Heart sounds: Normal heart sounds.  Pulmonary:     Effort: Pulmonary effort is normal.     Breath sounds: Normal breath sounds.  Abdominal:     General: Bowel sounds are normal.     Palpations: Abdomen is soft.  Musculoskeletal:        General: No tenderness or deformity. Normal range of motion.     Cervical back: Normal range of motion.  Lymphadenopathy:     Cervical: No cervical adenopathy.  Skin:    General: Skin is warm and dry.     Findings: No erythema or rash.  Neurological:     Mental Status: She is alert and oriented to person, place, and time.  Psychiatric:        Behavior: Behavior normal.        Thought Content: Thought content normal.        Judgment: Judgment normal.      Lab Results  Component Value Date   WBC 10.1 01/27/2024   HGB 12.4 01/27/2024   HCT 37.3 01/27/2024   MCV 80.6 01/27/2024   PLT 477 (H) 01/27/2024     Chemistry      Component Value Date/Time   NA 137 10/28/2023 0915   K 4.0 10/28/2023 0915   CL 104 10/28/2023 0915   CO2 25 10/28/2023 0915   BUN 9 10/28/2023 0915   CREATININE 0.66 10/28/2023 0915   CREATININE 0.69 12/06/2013 1631      Component Value Date/Time   CALCIUM 9.4 10/28/2023 0915   ALKPHOS 81 10/28/2023 0915   AST 13 (L) 10/28/2023 0915   ALT 9 10/28/2023 0915   BILITOT 0.4 10/28/2023 0915     Encounter Diagnoses  Name Primary?   Sickle cell trait (HCC) Yes   Menometrorrhagia    Iron deficiency anemia due to chronic blood loss    Other fatigue      Impression and Plan: Kimberly Guerra is a very charming 38 year old African-American female with IDA secondary to menometrorrhagia and sickle cell trait. She currently gets IV iron as needed. She also has a history of low vitamin D which corrected with oral supplementation.   Today her Hgb is 12.4, MCV now 80.6, platelets 477. Reticulocytes 13.0%. Iron studies pending.   Disposition RTC 3 months APP, labs (CBC, iron, ferritin,  vitamin D)  Rushie Chestnut, PA-C 1/30/20259:35 AM

## 2024-01-28 ENCOUNTER — Telehealth: Payer: Self-pay | Admitting: Medical Oncology

## 2024-01-28 NOTE — Telephone Encounter (Addendum)
Called to schedule IV Iron. Unable to LVM; no VM set up.   ----- Message from Rushie Chestnut sent at 01/27/2024  3:18 PM EST ----- IV iron would be helpful at this time. Scheduling please schedule her once weekly x 3 weeks for IV venofer 300 mg

## 2024-02-01 LAB — ALLERGENS, ZONE 2
Alternaria Alternata IgE: 2.01 kU/L — AB
Amer Sycamore IgE Qn: 0.31 kU/L — AB
Aspergillus Fumigatus IgE: <0.1 kU/L
Bahia Grass IgE: 14.6 kU/L — AB
Bermuda Grass IgE: 6.75 kU/L — AB
Cat Dander IgE: 0.23 kU/L — AB
Cedar, Mountain IgE: 0.5 kU/L — AB
Cladosporium Herbarum IgE: <0.1 kU/L
Cockroach, American IgE: 4.19 kU/L — AB
Common Silver Birch IgE: 0.33 kU/L — AB
D Farinae IgE: 5.24 kU/L — AB
D Pteronyssinus IgE: 5.45 kU/L — AB
Dog Dander IgE: 0.15 kU/L — AB
Elm, American IgE: 0.36 kU/L — AB
Hickory, White IgE: 0.76 kU/L — AB
Johnson Grass IgE: 11.7 kU/L — AB
Maple/Box Elder IgE: 0.26 kU/L — AB
Mucor Racemosus IgE: 0.13 kU/L — AB
Mugwort IgE Qn: 0.96 kU/L — AB
Nettle IgE: 0.72 kU/L — AB
Oak, White IgE: 1.72 kU/L — AB
Penicillium Chrysogen IgE: <0.1 kU/L
Pigweed, Rough IgE: 0.53 kU/L — AB
Plantain, English IgE: 0.32 kU/L — AB
Ragweed, Short IgE: 0.43 kU/L — AB
Sheep Sorrel IgE Qn: 0.31 kU/L — AB
Stemphylium Herbarum IgE: 1.82 kU/L — AB
Sweet gum IgE RAST Ql: 0.3 kU/L — AB
Timothy Grass IgE: 17.2 kU/L — AB
White Mulberry IgE: 0.18 kU/L — AB

## 2024-02-01 LAB — ALPHA-GAL PANEL
Allergen Lamb IgE: <0.1 kU/L
Beef IgE: <0.1 kU/L
IgE (Immunoglobulin E), Serum: 470 [IU]/mL (ref 6–495)
O215-IgE Alpha-Gal: <0.1 kU/L
Pork IgE: <0.1 kU/L

## 2024-02-01 LAB — THYROID PEROXIDASE ANTIBODY: Thyroperoxidase Ab SerPl-aCnc: 12 [IU]/mL (ref 0–34)

## 2024-02-01 LAB — CHRONIC URTICARIA: cu index: <2.4 (ref <10)

## 2024-02-01 LAB — TRYPTASE: Tryptase: 4.3 ug/L (ref 2.2–13.2)

## 2024-02-10 ENCOUNTER — Inpatient Hospital Stay: Payer: BC Managed Care – PPO | Attending: Medical Oncology

## 2024-02-10 VITALS — BP 138/84 | HR 79 | Temp 98.2°F | Resp 16

## 2024-02-10 DIAGNOSIS — D500 Iron deficiency anemia secondary to blood loss (chronic): Secondary | ICD-10-CM | POA: Diagnosis present

## 2024-02-10 DIAGNOSIS — N921 Excessive and frequent menstruation with irregular cycle: Secondary | ICD-10-CM | POA: Insufficient documentation

## 2024-02-10 MED ORDER — ONDANSETRON HCL 4 MG/2ML IJ SOLN
8.0000 mg | Freq: Once | INTRAMUSCULAR | Status: AC
Start: 2024-02-10 — End: 2024-02-10
  Administered 2024-02-10: 8 mg via INTRAVENOUS
  Filled 2024-02-10: qty 4

## 2024-02-10 MED ORDER — SODIUM CHLORIDE 0.9 % IV SOLN: Freq: Once | INTRAVENOUS | Status: AC

## 2024-02-10 MED ORDER — FAMOTIDINE IN NACL 20-0.9 MG/50ML-% IV SOLN
20.0000 mg | Freq: Once | INTRAVENOUS | Status: AC
  Administered 2024-02-10: 20 mg via INTRAVENOUS
  Filled 2024-02-10: qty 50

## 2024-02-10 MED ORDER — SODIUM CHLORIDE 0.9 % IV SOLN
300.0000 mg | Freq: Once | INTRAVENOUS | Status: AC
  Administered 2024-02-10: 300 mg via INTRAVENOUS
  Filled 2024-02-10: qty 300

## 2024-02-10 NOTE — Patient Instructions (Signed)

## 2024-02-14 ENCOUNTER — Other Ambulatory Visit: Payer: Self-pay | Admitting: Medical Oncology

## 2024-02-16 ENCOUNTER — Encounter: Payer: Self-pay | Admitting: Nurse Practitioner

## 2024-02-16 ENCOUNTER — Encounter (INDEPENDENT_AMBULATORY_CARE_PROVIDER_SITE_OTHER): Payer: Self-pay | Admitting: Nurse Practitioner

## 2024-02-16 DIAGNOSIS — N3000 Acute cystitis without hematuria: Secondary | ICD-10-CM | POA: Diagnosis not present

## 2024-02-16 MED ORDER — SULFAMETHOXAZOLE-TRIMETHOPRIM 800-160 MG PO TABS: 1.0000 | ORAL_TABLET | Freq: Two times a day (BID) | ORAL | 0 refills | Status: DC

## 2024-02-16 NOTE — Addendum Note (Signed)
Addended by: Michaela Corner on: 02/16/2024 03:42 PM   Modules accepted: Orders

## 2024-02-16 NOTE — Addendum Note (Signed)
Addended by: Michaela Corner on: 02/16/2024 04:02 PM   Modules accepted: Orders

## 2024-02-17 ENCOUNTER — Inpatient Hospital Stay: Payer: BC Managed Care – PPO

## 2024-02-17 VITALS — BP 116/72 | HR 76 | Temp 98.7°F | Resp 20

## 2024-02-17 DIAGNOSIS — D500 Iron deficiency anemia secondary to blood loss (chronic): Secondary | ICD-10-CM

## 2024-02-17 DIAGNOSIS — N921 Excessive and frequent menstruation with irregular cycle: Secondary | ICD-10-CM

## 2024-02-17 MED ORDER — SODIUM CHLORIDE 0.9 % IV SOLN
300.0000 mg | Freq: Once | INTRAVENOUS | Status: AC
  Administered 2024-02-17: 300 mg via INTRAVENOUS
  Filled 2024-02-17: qty 300

## 2024-02-17 MED ORDER — FAMOTIDINE IN NACL 20-0.9 MG/50ML-% IV SOLN
20.0000 mg | Freq: Once | INTRAVENOUS | Status: AC
  Administered 2024-02-17: 20 mg via INTRAVENOUS
  Filled 2024-02-17: qty 50

## 2024-02-17 MED ORDER — SODIUM CHLORIDE 0.9 % IV SOLN: Freq: Once | INTRAVENOUS | Status: AC

## 2024-02-17 MED ORDER — ONDANSETRON HCL 8 MG PO TABS
8.0000 mg | ORAL_TABLET | Freq: Once | ORAL | Status: AC
  Administered 2024-02-17: 8 mg via ORAL
  Filled 2024-02-17: qty 1

## 2024-02-17 NOTE — Patient Instructions (Signed)

## 2024-02-21 ENCOUNTER — Ambulatory Visit: Payer: BC Managed Care – PPO | Admitting: Internal Medicine

## 2024-02-21 DIAGNOSIS — J309 Allergic rhinitis, unspecified: Secondary | ICD-10-CM

## 2024-02-24 ENCOUNTER — Inpatient Hospital Stay: Payer: BC Managed Care – PPO

## 2024-02-24 VITALS — BP 120/87 | HR 74 | Temp 98.2°F | Resp 18

## 2024-02-24 DIAGNOSIS — N921 Excessive and frequent menstruation with irregular cycle: Secondary | ICD-10-CM

## 2024-02-24 DIAGNOSIS — D500 Iron deficiency anemia secondary to blood loss (chronic): Secondary | ICD-10-CM | POA: Diagnosis not present

## 2024-02-24 MED ORDER — SODIUM CHLORIDE 0.9 % IV SOLN
300.0000 mg | Freq: Once | INTRAVENOUS | Status: AC
  Administered 2024-02-24: 300.0065 mg via INTRAVENOUS
  Filled 2024-02-24: qty 300

## 2024-02-24 MED ORDER — SODIUM CHLORIDE 0.9 % IV SOLN: Freq: Once | INTRAVENOUS | Status: AC

## 2024-02-24 MED ORDER — ONDANSETRON HCL 8 MG PO TABS
8.0000 mg | ORAL_TABLET | Freq: Once | ORAL | Status: AC
  Administered 2024-02-24: 8 mg via ORAL
  Filled 2024-02-24: qty 1

## 2024-02-24 MED ORDER — FAMOTIDINE IN NACL 20-0.9 MG/50ML-% IV SOLN
20.0000 mg | Freq: Once | INTRAVENOUS | Status: AC
  Administered 2024-02-24: 20 mg via INTRAVENOUS
  Filled 2024-02-24: qty 50

## 2024-02-24 NOTE — Patient Instructions (Signed)

## 2024-02-25 NOTE — Telephone Encounter (Signed)
Please see the MyChart message reply(ies) for my assessment and plan.  The patient gave consent for this Medical Advice Message and is aware that it may result in a bill to their insurance company as well as the possibility that this may result in a co-payment or deductible. They are an established patient, but are not seeking medical advice exclusively about a problem treated during an in person or video visit in the last 7 days. I did not recommend an in person or video visit within 7 days of my reply.  I spent a total of 20minutes cumulative time within 7 days through MyChart messaging  Patrich Heinze, NP  

## 2024-03-07 ENCOUNTER — Encounter: Payer: Self-pay | Admitting: Gastroenterology

## 2024-04-06 ENCOUNTER — Telehealth: Payer: Self-pay

## 2024-04-06 NOTE — Telephone Encounter (Signed)
 Updated [facility] diagnosis L50.1 added L50.9 and J30.9

## 2024-04-08 ENCOUNTER — Telehealth: Admitting: Family

## 2024-04-08 DIAGNOSIS — B3731 Acute candidiasis of vulva and vagina: Secondary | ICD-10-CM | POA: Diagnosis not present

## 2024-04-08 MED ORDER — FLUCONAZOLE 150 MG PO TABS: 150.0000 mg | ORAL_TABLET | ORAL | 0 refills | Status: DC | PRN

## 2024-04-08 NOTE — Progress Notes (Signed)

## 2024-04-20 ENCOUNTER — Encounter: Payer: Self-pay | Admitting: Medical Oncology

## 2024-04-20 ENCOUNTER — Inpatient Hospital Stay: Payer: BC Managed Care – PPO | Attending: Medical Oncology

## 2024-04-20 ENCOUNTER — Inpatient Hospital Stay (HOSPITAL_BASED_OUTPATIENT_CLINIC_OR_DEPARTMENT_OTHER): Payer: BC Managed Care – PPO | Admitting: Medical Oncology

## 2024-04-20 VITALS — BP 136/95 | HR 75 | Temp 98.6°F | Resp 17 | Wt 173.0 lb

## 2024-04-20 DIAGNOSIS — D573 Sickle-cell trait: Secondary | ICD-10-CM

## 2024-04-20 DIAGNOSIS — D500 Iron deficiency anemia secondary to blood loss (chronic): Secondary | ICD-10-CM

## 2024-04-20 DIAGNOSIS — N921 Excessive and frequent menstruation with irregular cycle: Secondary | ICD-10-CM | POA: Insufficient documentation

## 2024-04-20 DIAGNOSIS — E559 Vitamin D deficiency, unspecified: Secondary | ICD-10-CM | POA: Insufficient documentation

## 2024-04-20 DIAGNOSIS — R5383 Other fatigue: Secondary | ICD-10-CM | POA: Diagnosis not present

## 2024-04-20 LAB — IRON AND IRON BINDING CAPACITY (CC-WL,HP ONLY)
Iron: 87 ug/dL (ref 28–170)
Saturation Ratios: 28 % (ref 10.4–31.8)
TIBC: 316 ug/dL (ref 250–450)
UIBC: 229 ug/dL (ref 148–442)

## 2024-04-20 LAB — RETIC PANEL
Immature Retic Fract: 12.1 % (ref 2.3–15.9)
RBC.: 4.84 MIL/uL (ref 3.87–5.11)
Retic Count, Absolute: 64.9 10*3/uL (ref 19.0–186.0)
Retic Ct Pct: 1.3 % (ref 0.4–3.1)
Reticulocyte Hemoglobin: 32.6 pg (ref >27.9)

## 2024-04-20 LAB — CBC WITH DIFFERENTIAL (CANCER CENTER ONLY)
Abs Immature Granulocytes: 0.06 10*3/uL (ref 0.00–0.07)
Basophils Absolute: 0 10*3/uL (ref 0.0–0.1)
Basophils Relative: 0 %
Eosinophils Absolute: 0.1 10*3/uL (ref 0.0–0.5)
Eosinophils Relative: 1 %
HCT: 39.8 % (ref 36.0–46.0)
Hemoglobin: 13.2 g/dL (ref 12.0–15.0)
Immature Granulocytes: 1 %
Lymphocytes Relative: 18 %
Lymphs Abs: 1.7 10*3/uL (ref 0.7–4.0)
MCH: 27.2 pg (ref 26.0–34.0)
MCHC: 33.2 g/dL (ref 30.0–36.0)
MCV: 82.1 fL (ref 80.0–100.0)
Monocytes Absolute: 0.5 10*3/uL (ref 0.1–1.0)
Monocytes Relative: 5 %
Neutro Abs: 7.3 10*3/uL (ref 1.7–7.7)
Neutrophils Relative %: 75 %
Platelet Count: 397 10*3/uL (ref 150–400)
RBC: 4.85 MIL/uL (ref 3.87–5.11)
RDW: 15.1 % (ref 11.5–15.5)
WBC Count: 9.7 10*3/uL (ref 4.0–10.5)
nRBC: 0 % (ref 0.0–0.2)

## 2024-04-20 LAB — VITAMIN D 25 HYDROXY (VIT D DEFICIENCY, FRACTURES): Vit D, 25-Hydroxy: 37.97 ng/mL (ref 30–100)

## 2024-04-20 LAB — FERRITIN: Ferritin: 687 ng/mL — ABNORMAL HIGH (ref 11–307)

## 2024-04-20 NOTE — Progress Notes (Addendum)
 Hematology and Oncology Follow Up Visit  TAKELIA URIETA [id number] 06/05/1986 38 y.o. 04/20/2024   Principle Diagnosis:  Iron  deficiency anemia-menometrorrhagia Sickle cell trait Vitamin D  Deficiency   Current Therapy:   IV iron -Venofer  given on 10/22/2022 - premeds Folic acid  2 mg p.o. daily OTC Vitamin D  2,000 I.U. Daily      Interim History:  Ms. Gilberto is back for follow-up.    Today she states that she has been a bit better since our last visit. Chronic hives have continued. She is Allegra  and Zyrtec  and still having hives.   In terms of her fatigue symptoms have continued. She is still taking vitamin D  supplementation- OTC currently 2,000 I.U. She has not noticed any improvement in her fatigue since taking this. Her last infusion of IV iron  was on 08/12/2023.   There has been no bleeding to her knowledge other than her menstrual cycles- They are normal in nature: denies epistaxis, gingivitis, hemoptysis, hematemesis, hematuria, melena, excessive bruising, blood donation.   She has had no change in bowel or bladder habits. No unintentional night sweats or weight loss  Overall, I would say that her performance status is ECOG 1.   Wt Readings from Last 3 Encounters:  04/20/24 173 lb (78.5 kg)  01/27/24 174 lb 1.9 oz (79 kg)  01/24/24 175 lb 9.6 oz (79.7 kg)   Medications:  Current Outpatient Medications:    cetirizine  (ZYRTEC ) 10 MG tablet, Take 1 tablet (10 mg total) by mouth daily., Disp: 90 tablet, Rfl: 1   docusate sodium  (COLACE) 100 MG capsule, Take 1 capsule (100 mg total) by mouth 2 (two) times daily., Disp: , Rfl:    eletriptan  (RELPAX ) 20 MG tablet, TAKE 1 TABLET (20 MG TOTAL) BY MOUTH DAILY AS NEEDED FOR MIGRAINE OR HEADACHE. MAY REPEAT IN 2 HOURS IF HEADACHE PERSISTS OR RECURS. NO MORE THAN 80MG  IN 24HRS, Disp: 12 tablet, Rfl: 0   famotidine  (PEPCID ) 20 MG tablet, Take 1 tablet (20 mg total) by mouth 2 (two) times daily., Disp: 90 tablet, Rfl: 1    fexofenadine  (ALLEGRA ) 180 MG tablet, Take 1 tablet (180 mg total) by mouth daily., Disp: 90 tablet, Rfl: 5   folic acid  (FOLVITE ) 1 MG tablet, Take 2 tablets (2 mg total) by mouth daily., Disp: 180 tablet, Rfl: 6   metoprolol  succinate (TOPROL -XL) 25 MG 24 hr tablet, Take 25 mg by mouth 2 (two) times daily., Disp: , Rfl:    Metoprolol  Tartrate 75 MG TABS, TAKE 1 TABLET BY MOUTH TWICE A DAY, Disp: 180 tablet, Rfl: 1   Multiple Vitamins-Minerals (WOMENS MULTIVITAMIN PLUS) TABS, Take by mouth daily., Disp: , Rfl:    valACYclovir  (VALTREX ) 1000 MG tablet, Take 1,000 mg by mouth., Disp: , Rfl:   Allergies:  Allergies  Allergen Reactions   Procardia Xl [Nifedipine Er] Hives, Itching and Other (See Comments)    Severe headache   Topamax [Topiramate] Palpitations    Heart palpitations    Past Medical History, Surgical history, Social history, and Family History were reviewed and updated.  Review of Systems: Review of Systems  Constitutional: Negative.   HENT:  Negative.    Eyes: Negative.   Respiratory: Negative.    Cardiovascular: Negative.   Gastrointestinal: Negative.   Endocrine: Negative.   Genitourinary: Negative.    Musculoskeletal: Negative.   Skin: Negative.   Neurological: Negative.   Hematological: Negative.   Psychiatric/Behavioral: Negative.      Physical Exam:  weight is 173 lb (78.5 kg). Her oral  temperature is 98.6 F (37 C). Her blood pressure is 136/95 (abnormal) and her pulse is 75. Her respiration is 17 and oxygen saturation is 100%.   Wt Readings from Last 3 Encounters:  04/20/24 173 lb (78.5 kg)  01/27/24 174 lb 1.9 oz (79 kg)  01/24/24 175 lb 9.6 oz (79.7 kg)    Physical Exam Vitals reviewed.  HENT:     Head: Normocephalic and atraumatic.  Eyes:     Pupils: Pupils are equal, round, and reactive to light.  Cardiovascular:     Rate and Rhythm: Normal rate and regular rhythm.     Heart sounds: Normal heart sounds.  Pulmonary:     Effort: Pulmonary  effort is normal.     Breath sounds: Normal breath sounds.  Abdominal:     General: Bowel sounds are normal.     Palpations: Abdomen is soft.  Musculoskeletal:        General: No tenderness or deformity. Normal range of motion.     Cervical back: Normal range of motion.  Lymphadenopathy:     Cervical: No cervical adenopathy.  Skin:    General: Skin is warm and dry.     Findings: No erythema or rash.  Neurological:     Mental Status: She is alert and oriented to person, place, and time.  Psychiatric:        Behavior: Behavior normal.        Thought Content: Thought content normal.        Judgment: Judgment normal.      Lab Results  Component Value Date   WBC 9.7 04/20/2024   HGB 13.2 04/20/2024   HCT 39.8 04/20/2024   MCV 82.1 04/20/2024   PLT 397 04/20/2024     Chemistry      Component Value Date/Time   NA 137 10/28/2023 0915   K 4.0 10/28/2023 0915   CL 104 10/28/2023 0915   CO2 25 10/28/2023 0915   BUN 9 10/28/2023 0915   CREATININE 0.66 10/28/2023 0915   CREATININE 0.69 12/06/2013 1631      Component Value Date/Time   CALCIUM 9.4 10/28/2023 0915   ALKPHOS 81 10/28/2023 0915   AST 13 (L) 10/28/2023 0915   ALT 9 10/28/2023 0915   BILITOT 0.4 10/28/2023 0915     Encounter Diagnoses  Name Primary?   Iron  deficiency anemia due to chronic blood loss Yes   Sickle cell trait (HCC)    Other fatigue    Impression and Plan: Ms. Gerbino is a very charming 38 year old African-American female with IDA secondary to menometrorrhagia and sickle cell trait. She currently gets IV iron  as needed. She also has a history of low vitamin D  which corrected with oral supplementation.   Today her Hgb is 13.2, MCV now 82.1, platelets 397. Reticulocytes and iron  studies pending.   If vitamin D  is normal I would suggest additional work up related to her urticaria and bone pains.   Disposition RTC 3 months APP, labs (CBC, iron , ferritin, vitamin D )  Sharla Davis,  PA-C 4/24/202510:10 AM

## 2024-04-23 NOTE — Progress Notes (Unsigned)
 Chief Complaint: Primary GI MD: Dr. Savannah Curlin  HPI: discussed the use of AI scribe software for clinical note transcription with the patient, who gave verbal consent to proceed.  History of Present Illness Kimberly Guerra is a 38 year old female who presents for a gastroenterology consultation due to constipation.  In October or November of last year, she experienced severe abdominal pain and difficulty with bowel movements. She was prescribed Colace and another unspecified medication, which eventually alleviated her symptoms, allowing her to resume normal bowel movements.  She continues to experience intermittent constipation, characterized by 'pebble-like' stools, although she has bowel movements every day or every other day. Currently, she is not experiencing significant issues but wanted to attend the appointment as scheduled.  She has a history of endometriosis, diagnosed in 2018, and polycystic ovary syndrome (PCOS). A colonoscopy in 2022 showed no evidence of endometriosis affecting her colon but revealed a hyperplastic polyp and internal hemorrhoids, with a recommendation to return in ten years.  She primarily drinks water, occasionally consuming juice for variety. No current significant constipation issues, but reports intermittent 'pebble-like' bowel movements.   PREVIOUS GI WORKUP   Colonoscopy 06/26/2021 for rectal bleeding - One 2 mm polyp (hyperplastic) in the rectum, removed with a cold snare. Resected and retrieved.  - Internal hemorrhoids, the likely source of bleeding.  - The examination was otherwise normal on direct and retroflexion views. - Repeat 10 years (2032)  EGD 06/26/2021 for GERD - Normal esophagus. Biopsied.  - Non- bleeding gastric ulcer with no stigmata of bleeding. Biopsied.  - Normal examined duodenum.  Diagnosis 1. Surgical [P], fundus, gastric antrum, and gastric body - REACTIVE GASTROPATHY. Jhon Moselle IS NEGATIVE FOR HELICOBACTER  PYLORI. - NO INTESTINAL METAPLASIA, DYSPLASIA, OR MALIGNANCY. 2. Surgical [P], distal esophagus - REFLUX CHANGES. - NO INTESTINAL METAPLASIA, DYSPLASIA, OR MALIGNANCY. 3. Surgical [P], mid proximal esophagus - BENIGN SQUAMOUS MUCOSA. - NO INCREASE IN INTRAEPITHELIAL EOSINOPHILS. - NO INTESTINAL METAPLASIA, DYSPLASIA, OR MALIGNANCY. 4. Surgical [P], colon, sigmoid, polyp (1) - HYPERPLASTIC POLYP. - NO DYSPLASIA OR MALIGNANCY.  Past Medical History:  Diagnosis Date   Anemia    Asthma    prn inhaler   History of gestational hypertension    Irregular menstrual cycle    Menometrorrhagia 03/27/2022   Menorrhagia    Migraines    Seasonal allergies    Sickle cell trait (HCC)    Sickle cell trait (HCC) 06/11/2022    Past Surgical History:  Procedure Laterality Date   CARPAL TUNNEL RELEASE Right 03/25/2015   Procedure:  ENDOSCOPIC CARPAL TUNNEL RELEASE;  Surgeon: Rober Chimera, MD;  Location: [facility];  Service: Orthopedics;  Laterality: Right;   CESAREAN SECTION  01/02/2009   DORSAL COMPARTMENT RELEASE Right 03/25/2015   Procedure: RIGHT WRIST DEQUERVAIN RELEASE AND ;  Surgeon: Rober Chimera, MD;  Location: [facility];  Service: Orthopedics;  Laterality: Right;   HYSTEROSCOPY WITH D & C N/A 06/24/2017   Procedure: DILATATION AND CURETTAGE /HYSTEROSCOPY  pelvic biopsy and ablation of endometriosis;  Surgeon: Johnn Najjar, MD;  Location[facility];  Service: Gynecology;  Laterality: N/A;   LAPAROSCOPY N/A 06/24/2017   Procedure: LAPAROSCOPY DIAGNOSTIC;  Surgeon: Johnn Najjar, MD;  Location: [facility];  Service: Gynecology;  Laterality: N/A;   WISDOM TOOTH EXTRACTION      Current Outpatient Medications  Medication Sig Dispense Refill   cetirizine  (ZYRTEC ) 10 MG tablet Take 1 tablet (10 mg total) by mouth daily. 90  tablet 1   eletriptan  (RELPAX ) 20 MG tablet TAKE 1 TABLET (20 MG TOTAL) BY MOUTH DAILY AS NEEDED FOR  MIGRAINE OR HEADACHE. MAY REPEAT IN 2 HOURS IF HEADACHE PERSISTS OR RECURS. NO MORE THAN 80MG  IN 24HRS 12 tablet 0   famotidine  (PEPCID ) 20 MG tablet Take 1 tablet (20 mg total) by mouth 2 (two) times daily. 90 tablet 1   fexofenadine  (ALLEGRA ) 180 MG tablet Take 1 tablet (180 mg total) by mouth daily. 90 tablet 5   folic acid  (FOLVITE ) 1 MG tablet Take 2 tablets (2 mg total) by mouth daily. 180 tablet 6   metoprolol  succinate (TOPROL -XL) 25 MG 24 hr tablet Take 25 mg by mouth 2 (two) times daily.     Metoprolol  Tartrate 75 MG TABS TAKE 1 TABLET BY MOUTH TWICE A DAY 180 tablet 1   Multiple Vitamins-Minerals (WOMENS MULTIVITAMIN PLUS) TABS Take by mouth daily.     valACYclovir  (VALTREX ) 1000 MG tablet Take 1,000 mg by mouth.     No current facility-administered medications for this visit.    Allergies as of 04/24/2024 - Review Complete 04/24/2024  Allergen Reaction Noted   Procardia xl [nifedipine er] Hives, Itching, and Other (See Comments) 07/22/2023   Topamax [topiramate] Palpitations 08/08/2018    Family History  Problem Relation Age of Onset   Hypertension Mother    Asthma Father    Hypertension Father    Asthma Brother    Asthma Paternal Aunt    Diabetes Paternal Aunt    Cancer Maternal Grandfather        oral cancer   Diabetes Maternal Grandfather    Hypertension Maternal Grandfather    Colon cancer Neg Hx    Esophageal cancer Neg Hx    Rectal cancer Neg Hx    Stomach cancer Neg Hx     Social History   Socioeconomic History   Marital status: Single    Spouse name: Not on file   Number of children: 1   Years of education: 12   Highest education level: Not on file  Occupational History   Occupation: Investment banker, corporate: [employer]  Tobacco Use   Smoking status: Never   Smokeless tobacco: Never  Vaping Use   Vaping status: Never Used  Substance and Sexual Activity   Alcohol use: Yes    Alcohol/week: 3.0 standard drinks of alcohol    Types: 3 Glasses of wine per  week    Comment: 1-2 occasionally   Drug use: No   Sexual activity: Yes    Birth control/protection: None  Other Topics Concern   Not on file  Social History Narrative   Lives with son (2010), employeed at [employer] part-time. Completed some college classes.    Social Drivers of Corporate investment banker Strain: Not on file  Food Insecurity: Not on file  Transportation Needs: Not on file  Physical Activity: Not on file  Stress: Not on file  Social Connections: Not on file  Intimate Partner Violence: Not on file    Review of Systems:    Constitutional: No weight loss, fever, chills, weakness or fatigue HEENT: Eyes: No change in vision               Ears, Nose, Throat:  No change in hearing or congestion Skin: No rash or itching Cardiovascular: No chest pain, chest pressure or palpitations   Respiratory: No SOB or cough Gastrointestinal: See HPI and otherwise negative Genitourinary: No dysuria or change in urinary frequency  Neurological: No headache, dizziness or syncope Musculoskeletal: No new muscle or joint pain Hematologic: No bleeding or bruising Psychiatric: No history of depression or anxiety    Physical Exam:  Vital signs: BP (!) 146/90   Pulse 78   Ht 4\' 11"  (1.499 m)   Wt 179 lb (81.2 kg)   SpO2 96%   BMI 36.15 kg/m   Constitutional: NAD, alert and cooperative Head:  Normocephalic and atraumatic. Eyes:   PEERL, EOMI. No icterus. Conjunctiva pink. Respiratory: Respirations even and unlabored. Lungs clear to auscultation bilaterally.   No wheezes, crackles, or rhonchi.  Cardiovascular:  Regular rate and rhythm. No peripheral edema, cyanosis or pallor.  Gastrointestinal:  Soft, nondistended, nontender. No rebound or guarding.  Hypoactive bowel sounds. No appreciable masses or hepatomegaly. Rectal:  Declines Msk:  Symmetrical without gross deformities. Without edema, no deformity or joint abnormality.  Neurologic:  Alert and  oriented x4;  grossly normal  neurologically.  Skin:   Dry and intact without significant lesions or rashes. Psychiatric: Oriented to person, place and time. Demonstrates good judgement and reason without abnormal affect or behaviors.   RELEVANT LABS AND IMAGING: CBC    Component Value Date/Time   WBC 9.7 04/20/2024 0937   WBC 9.9 10/19/2023 1434   RBC 4.84 04/20/2024 0938   RBC 4.85 04/20/2024 0937   HGB 13.2 04/20/2024 0937   HCT 39.8 04/20/2024 0937   PLT 397 04/20/2024 0937   MCV 82.1 04/20/2024 0937   MCV 75.6 (A) 12/01/2016 1709   MCH 27.2 04/20/2024 0937   MCHC 33.2 04/20/2024 0937   RDW 15.1 04/20/2024 0937   LYMPHSABS 1.7 04/20/2024 0937   MONOABS 0.5 04/20/2024 0937   EOSABS 0.1 04/20/2024 0937   BASOSABS 0.0 04/20/2024 0937    CMP     Component Value Date/Time   NA 137 10/28/2023 0915   K 4.0 10/28/2023 0915   CL 104 10/28/2023 0915   CO2 25 10/28/2023 0915   GLUCOSE 105 (H) 10/28/2023 0915   BUN 9 10/28/2023 0915   CREATININE 0.66 10/28/2023 0915   CREATININE 0.69 12/06/2013 1631   CALCIUM 9.4 10/28/2023 0915   PROT 7.7 10/28/2023 0915   ALBUMIN 4.4 10/28/2023 0915   AST 13 (L) 10/28/2023 0915   ALT 9 10/28/2023 0915   ALKPHOS 81 10/28/2023 0915   BILITOT 0.4 10/28/2023 0915   GFRNONAA >60 10/28/2023 0915   GFRAA >60 06/15/2017 1440     Assessment/Plan:   Dyspepsia EGD 05/2021 for GERD showed nonbleeding gastric ulcer.  Biopsies negative for H. pylori, intestinal metaplasia, eosinophils.  Negative alpha gal.  CBC with no anemia.  Normal iron  studies and ferritin.  No issues at this time.  Resolved.  Constipation Colonoscopy 05/2021 done for rectal bleeding/constipation with hyperplastic polyp, internal hemorrhoids, otherwise normal with a recall of 10 years (2032).  Continuing to experience intermittent constipation which is currently well-controlled at this time. - Fiber supplement - MiraLAX  as needed - Continue to increase water, increase exercise, increase fiber - Follow-up  4 months  Sickle cell trait  Assigned to Dr. Bridgett Camps today  Suzanna Erp, PA-C [facility] 04/24/2024, 11:45 AM  Cc: Kandace Organ, NP

## 2024-04-24 ENCOUNTER — Encounter: Payer: Self-pay | Admitting: Nurse Practitioner

## 2024-04-24 ENCOUNTER — Encounter: Payer: Self-pay | Admitting: Gastroenterology

## 2024-04-24 ENCOUNTER — Ambulatory Visit: Admitting: Nurse Practitioner

## 2024-04-24 ENCOUNTER — Ambulatory Visit: Admitting: Gastroenterology

## 2024-04-24 VITALS — BP 138/88 | HR 76 | Temp 98.4°F | Ht 59.0 in | Wt 175.6 lb

## 2024-04-24 VITALS — BP 146/90 | HR 78 | Ht 59.0 in | Wt 179.0 lb

## 2024-04-24 DIAGNOSIS — R1013 Epigastric pain: Secondary | ICD-10-CM | POA: Diagnosis not present

## 2024-04-24 DIAGNOSIS — D500 Iron deficiency anemia secondary to blood loss (chronic): Secondary | ICD-10-CM | POA: Diagnosis not present

## 2024-04-24 DIAGNOSIS — D573 Sickle-cell trait: Secondary | ICD-10-CM

## 2024-04-24 DIAGNOSIS — R7989 Other specified abnormal findings of blood chemistry: Secondary | ICD-10-CM | POA: Diagnosis not present

## 2024-04-24 DIAGNOSIS — K5900 Constipation, unspecified: Secondary | ICD-10-CM | POA: Diagnosis not present

## 2024-04-24 NOTE — Patient Instructions (Addendum)
 Start taking Miralax  1 capful (17 grams) 1x / day for 1 week.   If this is not effective, increase to 1 dose 2x / day for 1 week.   If this is still not effective, increase to two capfuls (34 grams) 2x / day.   Can adjust dose as needed based on response. Can take 1/2 cap daily, skip days, or increase per day.    Follow up in 4 months  _______________________________________________________  If your blood pressure at your visit was 140/90 or greater, please contact your primary care physician to follow up on this.  _______________________________________________________  If you are age 35 or older, your body mass index should be between 23-30. Your Body mass index is 36.15 kg/m. If this is out of the aforementioned range listed, please consider follow up with your Primary Care Provider.  If you are age 48 or younger, your body mass index should be between 19-25. Your Body mass index is 36.15 kg/m. If this is out of the aformentioned range listed, please consider follow up with your Primary Care Provider.   ________________________________________________________  The [facility] providers would like to encourage you to use MYCHART to communicate with providers for non-urgent requests or questions.  Due to long hold times on the telephone, sending your provider a message by [facility] may be a faster and more efficient way to get a response.  Please allow 48 business hours for a response.  Please remember that this is for non-urgent requests.  _______________________________________________________  Thank you for entrusting me with your care and choosing [facility].  Bayley Luan Rumpf, PA-C

## 2024-04-24 NOTE — Progress Notes (Signed)
 Established Patient Visit  Patient: Kimberly Guerra   DOB: 1986-05-20   38 y.o. Female  MRN: [id number] Visit Date: 04/24/2024  Subjective:    Chief Complaint  Patient presents with   Follow-up    Discuss concerns from Oncology   HPI Iron  deficiency anemia due to chronic blood loss Under the care of hematology IV infusion x 3 completed in february Elevated ferritin, normal iron , IBC and CBC noted 4days ago (04/20/2024) Had eval with GI and GYN Current use of oral prenatal with iron . She was advised to discuss need for rheumatology referral  Will repeat esr, crp, iron  panel, cbc and cmp in 4weeks Will refer to rheumatology if ferritin remains elevated.   Reviewed medical, surgical, and social history today  Medications: Outpatient Medications Prior to Visit  Medication Sig   cetirizine  (ZYRTEC ) 10 MG tablet Take 1 tablet (10 mg total) by mouth daily.   eletriptan  (RELPAX ) 20 MG tablet TAKE 1 TABLET (20 MG TOTAL) BY MOUTH DAILY AS NEEDED FOR MIGRAINE OR HEADACHE. MAY REPEAT IN 2 HOURS IF HEADACHE PERSISTS OR RECURS. NO MORE THAN 80MG  IN 24HRS   famotidine  (PEPCID ) 20 MG tablet Take 1 tablet (20 mg total) by mouth 2 (two) times daily.   fexofenadine  (ALLEGRA ) 180 MG tablet Take 1 tablet (180 mg total) by mouth daily.   folic acid  (FOLVITE ) 1 MG tablet Take 2 tablets (2 mg total) by mouth daily.   metoprolol  succinate (TOPROL -XL) 25 MG 24 hr tablet Take 25 mg by mouth 2 (two) times daily.   Metoprolol  Tartrate 75 MG TABS TAKE 1 TABLET BY MOUTH TWICE A DAY   Multiple Vitamins-Minerals (WOMENS MULTIVITAMIN PLUS) TABS Take by mouth daily.   valACYclovir  (VALTREX ) 1000 MG tablet Take 1,000 mg by mouth.   No facility-administered medications prior to visit.   Reviewed past medical and social history.   ROS per HPI above  Last CBC Lab Results  Component Value Date   WBC 9.7 04/20/2024   HGB 13.2 04/20/2024   HCT 39.8 04/20/2024   MCV 82.1 04/20/2024   MCH  27.2 04/20/2024   RDW 15.1 04/20/2024   PLT 397 04/20/2024   Last thyroid  functions Lab Results  Component Value Date   TSH 0.765 01/07/2023   T4TOTAL 9.3 10/15/2015      Objective:  BP 138/88 (BP Location: Right Arm, Patient Position: Sitting, Cuff Size: Large)   Pulse 76   Temp 98.4 F (36.9 C) (Temporal)   Ht 4\' 11"  (1.499 m)   Wt 175 lb 9.6 oz (79.7 kg)   LMP 03/18/2024   BMI 35.47 kg/m      Physical Exam Vitals and nursing note reviewed.  Cardiovascular:     Rate and Rhythm: Normal rate.     Pulses: Normal pulses.  Pulmonary:     Effort: Pulmonary effort is normal.  Neurological:     Mental Status: She is alert and oriented to person, place, and time.     No results found for any visits on 04/24/24.    Assessment & Plan:    Problem List Items Addressed This Visit     Iron  deficiency anemia due to chronic blood loss - Primary   Under the care of hematology IV infusion x 3 completed in february Elevated ferritin, normal iron , IBC and CBC noted 4days ago (04/20/2024) Had eval with GI and GYN Current use of oral prenatal with iron . She  was advised to discuss need for rheumatology referral  Will repeat esr, crp, iron  panel, cbc and cmp in 4weeks Will refer to rheumatology if ferritin remains elevated.      Relevant Orders   Iron , TIBC and Ferritin Panel   CBC   Comprehensive metabolic panel with GFR   Other Visit Diagnoses       Elevated ferritin level       Relevant Orders   Iron , TIBC and Ferritin Panel   CBC   Sedimentation rate   C-reactive protein   Hemoglobin A1c      Return if symptoms worsen or fail to improve.     Kathrene Parents, NP

## 2024-04-24 NOTE — Assessment & Plan Note (Addendum)
 Under the care of hematology IV infusion x 3 completed in february Elevated ferritin, normal iron , IBC and CBC noted 4days ago (04/20/2024) Had eval with GI and GYN Current use of oral prenatal with iron . She was advised to discuss need for rheumatology referral  Will repeat esr, crp, iron  panel, cbc and cmp in 4weeks Will refer to rheumatology if ferritin remains elevated.

## 2024-04-24 NOTE — Patient Instructions (Addendum)
 Schedule apt with asthma and allergy specialist to discuss use of xolair.  Do not take any OVER THE COUNTER multivitamin with iron   Schedule fasting lab appt. Need to be fasting 8hrs prior to blood draw. Ok to drink water and take BP meds.

## 2024-05-15 ENCOUNTER — Encounter: Payer: Self-pay | Admitting: Nurse Practitioner

## 2024-05-15 DIAGNOSIS — N912 Amenorrhea, unspecified: Secondary | ICD-10-CM

## 2024-05-18 NOTE — Progress Notes (Signed)
 Addendum: Reviewed and agree with assessment and management plan. Asha Grumbine, Carie Caddy, MD

## 2024-05-25 ENCOUNTER — Ambulatory Visit: Payer: Self-pay | Admitting: Nurse Practitioner

## 2024-05-25 ENCOUNTER — Other Ambulatory Visit (INDEPENDENT_AMBULATORY_CARE_PROVIDER_SITE_OTHER)

## 2024-05-25 DIAGNOSIS — D500 Iron deficiency anemia secondary to blood loss (chronic): Secondary | ICD-10-CM | POA: Diagnosis not present

## 2024-05-25 DIAGNOSIS — R7989 Other specified abnormal findings of blood chemistry: Secondary | ICD-10-CM

## 2024-05-25 LAB — COMPREHENSIVE METABOLIC PANEL WITH GFR
ALT: 14 U/L (ref 0–35)
AST: 14 U/L (ref 0–37)
Albumin: 4.1 g/dL (ref 3.5–5.2)
Alkaline Phosphatase: 85 U/L (ref 39–117)
BUN: 13 mg/dL (ref 6–23)
CO2: 25 meq/L (ref 19–32)
Calcium: 9.3 mg/dL (ref 8.4–10.5)
Chloride: 104 meq/L (ref 96–112)
Creatinine, Ser: 0.66 mg/dL (ref 0.40–1.20)
GFR: 112.14 mL/min (ref >60.00)
Glucose, Bld: 91 mg/dL (ref 70–99)
Potassium: 4.3 meq/L (ref 3.5–5.1)
Sodium: 136 meq/L (ref 135–145)
Total Bilirubin: 0.2 mg/dL (ref 0.2–1.2)
Total Protein: 7.5 g/dL (ref 6.0–8.3)

## 2024-05-25 LAB — HEMOGLOBIN A1C: Hgb A1c MFr Bld: 5.9 % (ref 4.6–6.5)

## 2024-05-25 LAB — CBC
HCT: 39.8 % (ref 36.0–46.0)
Hemoglobin: 12.9 g/dL (ref 12.0–15.0)
MCHC: 32.4 g/dL (ref 30.0–36.0)
MCV: 83.9 fl (ref 78.0–100.0)
Platelets: 395 10*3/uL (ref 150.0–400.0)
RBC: 4.75 Mil/uL (ref 3.87–5.11)
RDW: 15.6 % — ABNORMAL HIGH (ref 11.5–15.5)
WBC: 8.1 10*3/uL (ref 4.0–10.5)

## 2024-05-25 LAB — SEDIMENTATION RATE: Sed Rate: 26 mm/h — ABNORMAL HIGH (ref 0–20)

## 2024-05-25 LAB — C-REACTIVE PROTEIN: CRP: 1.5 mg/dL (ref 0.5–20.0)

## 2024-05-26 LAB — IRON,TIBC AND FERRITIN PANEL
%SAT: 16 % (ref 16–45)
Ferritin: 350 ng/mL — ABNORMAL HIGH (ref 16–154)
Iron: 48 ug/dL (ref 40–190)
TIBC: 299 ug/dL (ref 250–450)

## 2024-05-31 ENCOUNTER — Other Ambulatory Visit: Payer: Self-pay

## 2024-05-31 ENCOUNTER — Ambulatory Visit: Admitting: Internal Medicine

## 2024-05-31 VITALS — BP 124/76 | HR 83 | Temp 98.1°F | Resp 18 | Wt 171.8 lb

## 2024-05-31 DIAGNOSIS — L501 Idiopathic urticaria: Secondary | ICD-10-CM

## 2024-05-31 DIAGNOSIS — D508 Other iron deficiency anemias: Secondary | ICD-10-CM | POA: Diagnosis not present

## 2024-05-31 DIAGNOSIS — R899 Unspecified abnormal finding in specimens from other organs, systems and tissues: Secondary | ICD-10-CM | POA: Diagnosis not present

## 2024-05-31 DIAGNOSIS — J4520 Mild intermittent asthma, uncomplicated: Secondary | ICD-10-CM

## 2024-05-31 MED ORDER — EPINEPHRINE 0.3 MG/0.3ML IJ SOAJ: 0.3000 mg | INTRAMUSCULAR | 1 refills | Status: AC | PRN

## 2024-05-31 NOTE — Patient Instructions (Addendum)
 Chronic Idiopathic Urticaria: not well controlled  Daily hives for over two months, lasting approximately 30 minutes to an hour each episode. Hives are exacerbated by stress and scratching. Dermatographism noted. Currently on Zyrtec  at night, which provides some relief but does not fully control symptoms. -Continue Allegra  180mg  inAM, Zyrtec  10mg  in PM  -Continue Pepcid  20mg  daily  -labs were reassuring, we will repeat ferritin in 3 months to ensure it is still trending down  -Start xolair 300mg  every 4 weeks,  informed consent obtained today   -Plan to return on Friday at 2:30Pm for sample of xolair.  There will be an hour wait time   Asthma Diagnosed in 2010, no recent exacerbations or hospitalizations. Triggered by change in weather and severe colds. Currently managed with albuterol  as needed. Breathing test: normal, but did improve with albuerol  - Continue air supra 2 puffs every 4 hours as needed for cough, wheeze, dyspnea  - Use a spacer with all inhalers - please keep track of how often you are needing rescue inhaler Albuterol  (Proair /Ventolin ) as this will help guide future management - Asthma is not controlled if:  - Symptoms are occurring >2 times a week  during the day  OR  - >2 times a month nighttime awakenings  - Please call the clinic to schedule a follow up if these symptoms arise    Follow up: on Friday at 2:30 for xolair  Follow up: in clinic with me in 6 months   Thank you so much for letting me partake in your care today.  Don't hesitate to reach out if you have any additional concerns!  Orelia Binet, MD  [facility] [place], [place]

## 2024-05-31 NOTE — Progress Notes (Unsigned)
 FOLLOW UP Date of Service/Encounter:  06/02/24  Subjective:  Kimberly Guerra (DOB: Jun 26, 1986) is a 38 y.o. female who [facility] in re-evaluation of the following: urticaria  History obtained from: chart review and patient.  For Review, LV was on 01/24/24  with Dr. Jolayne Natter seen for intial visit for urticaria and asthma . See below for summary of history and diagnostics.   Today presents for follow-up. Discussed the use of AI scribe software for clinical note transcription with the patient, who gave verbal consent to proceed.  History of Present Illness Kimberly Guerra is a 38 year old female with chronic hives who presents for management of her condition.  She has chronic hives that have remained unchanged in frequency, severity, and duration. She is currently taking Allegra  during the day, Zyrtec  at night, and Pepcid , but these medications have not provided relief.  Her allergy blood work showed positive results for dust mite, grass, roach, mold, trees, weeds, and grass.  Her hematologist noted elevated ferritin levels, which were initially high at 687 and later decreased to 350. She has a history of low iron  and receives iron  infusions, with the last infusion occurring at the end of February. Other inflammatory markers were normal. She is unsure about the next steps as different doctors have given varying advice.  Her hematologist stopped iron  infusions.    Asthma is well-controlled.  She has rarely used her Airsupra .  All medications reviewed by clinical staff and updated in chart. No new pertinent medical or surgical history except as noted in HPI.  ROS: All others negative except as noted per HPI.   Objective:  BP 124/76   Pulse 83   Temp 98.1 F (36.7 C) (Temporal)   Resp 18   Wt 171 lb 12.8 oz (77.9 kg)   SpO2 98%   BMI 34.70 kg/m  Body mass index is 34.7 kg/m. Physical Exam: General Appearance:  Alert,  cooperative, no distress, appears stated age  Head:  Normocephalic, without obvious abnormality, atraumatic  Eyes:  Conjunctiva clear, EOM's intact  Ears EACs normal bilaterally  Nose: Nares normal, no rhinorrhea   Throat: Lips, tongue normal; teeth and gums normal, MMM  Neck: Supple, symmetrical  Lungs:    , Respirations unlabored, no coughing  Heart:   , Appears well perfused  Extremities: No edema  Skin: Skin color, texture, turgor normal and no rashes or lesions on visualized portions of skin  Neurologic: No gross deficits   Labs:  Lab Orders  No laboratory test(s) ordered today    Assessment/Plan   Patient Instructions  Chronic Idiopathic Urticaria: not well controlled  Daily hives for over two months, lasting approximately 30 minutes to an hour each episode. Hives are exacerbated by stress and scratching. Dermatographism noted. Currently on Zyrtec  at night, which provides some relief but does not fully control symptoms. -Continue Allegra  180mg  inAM, Zyrtec  10mg  in PM  -Continue Pepcid  20mg  daily  -labs were reassuring, we will repeat ferritin in 3 months to ensure it is still trending down  -Start xolair 300mg  every 4 weeks,  informed consent obtained today   -Plan to return on Friday at 2:30Pm for sample of xolair.  There will be an hour wait time   Asthma Diagnosed in 2010, no recent exacerbations or hospitalizations. Triggered by change in weather and severe colds. Currently managed with albuterol  as needed. Breathing test: normal, but did improve with albuerol  - Continue air supra 2  puffs every 4 hours as needed for cough, wheeze, dyspnea  - Use a spacer with all inhalers - please keep track of how often you are needing rescue inhaler Albuterol  (Proair /Ventolin ) as this will help guide future management - Asthma is not controlled if:  - Symptoms are occurring >2 times a week  during the day  OR  - >2 times a month nighttime awakenings  - Please call the clinic to  schedule a follow up if these symptoms arise    Follow up: on Friday at 2:30 for xolair  Follow up: in clinic with me in 6 months   Thank you so much for letting me partake in your care today.  Don't hesitate to reach out if you have any additional concerns!  Orelia Binet, MD  [facility] [place], [place]      Other:    Thank you so much for letting me partake in your care today.  Don't hesitate to reach out if you have any additional concerns!  Orelia Binet, MD  [facility] [place], [place]

## 2024-06-02 ENCOUNTER — Ambulatory Visit

## 2024-06-02 DIAGNOSIS — L501 Idiopathic urticaria: Secondary | ICD-10-CM

## 2024-06-02 MED ORDER — OMALIZUMAB 150 MG/ML ~~LOC~~ SOSY
300.0000 mg | PREFILLED_SYRINGE | SUBCUTANEOUS | Status: AC
  Administered 2024-06-02: 300 mg via SUBCUTANEOUS

## 2024-06-02 NOTE — Progress Notes (Signed)
 Patient came in today to get sample of Xolair 300 mg Q  weeks-per Dr. Jolayne Natter. No problems after 30 minute wait.

## 2024-06-05 ENCOUNTER — Other Ambulatory Visit: Payer: Self-pay | Admitting: Nurse Practitioner

## 2024-06-05 DIAGNOSIS — G43709 Chronic migraine without aura, not intractable, without status migrainosus: Secondary | ICD-10-CM

## 2024-06-08 MED ORDER — ELETRIPTAN HYDROBROMIDE 20 MG PO TABS: 20.0000 mg | ORAL_TABLET | Freq: Every day | ORAL | 0 refills | Status: DC | PRN

## 2024-06-08 NOTE — Telephone Encounter (Signed)
 Rx came back with below error message:The message was not sent electronically to the requested pharmacy. Contact the pharmacy about the approved prescription.   Code: 601 - Receiver unable to process  Prescription no longer active

## 2024-06-08 NOTE — Addendum Note (Signed)
 Addended by: Stefani Edin on: 06/08/2024 04:50 PM   Modules accepted: Orders

## 2024-06-26 ENCOUNTER — Telehealth: Payer: Self-pay | Admitting: *Deleted

## 2024-06-26 MED ORDER — XOLAIR 300 MG/2ML ~~LOC~~ SOSY: 300.0000 mg | PREFILLED_SYRINGE | SUBCUTANEOUS | 11 refills | Status: DC

## 2024-06-26 NOTE — Telephone Encounter (Signed)
 L/m for patient to contact me to advise approval, copay card and submit to [facility] specialty pharmacy for Xolair 

## 2024-06-26 NOTE — Telephone Encounter (Signed)
 Spoke to patient and advised submit to [facility] specialty and will reach out once delivery set to make appt for next injection

## 2024-07-14 ENCOUNTER — Other Ambulatory Visit: Payer: Self-pay

## 2024-07-17 ENCOUNTER — Ambulatory Visit

## 2024-07-17 DIAGNOSIS — L501 Idiopathic urticaria: Secondary | ICD-10-CM

## 2024-07-17 MED ORDER — OMALIZUMAB 300 MG/2  ML ~~LOC~~ SOSY
300.0000 mg | PREFILLED_SYRINGE | SUBCUTANEOUS | Status: AC
  Administered 2024-07-17 – 2024-10-12 (×3): 300 mg via SUBCUTANEOUS

## 2024-07-18 ENCOUNTER — Ambulatory Visit

## 2024-07-20 ENCOUNTER — Inpatient Hospital Stay: Attending: Medical Oncology

## 2024-07-20 ENCOUNTER — Inpatient Hospital Stay (HOSPITAL_BASED_OUTPATIENT_CLINIC_OR_DEPARTMENT_OTHER): Admitting: Medical Oncology

## 2024-07-20 ENCOUNTER — Inpatient Hospital Stay

## 2024-07-20 VITALS — BP 122/76 | HR 80 | Temp 98.4°F | Resp 18

## 2024-07-20 DIAGNOSIS — E559 Vitamin D deficiency, unspecified: Secondary | ICD-10-CM | POA: Diagnosis not present

## 2024-07-20 DIAGNOSIS — N921 Excessive and frequent menstruation with irregular cycle: Secondary | ICD-10-CM | POA: Diagnosis not present

## 2024-07-20 DIAGNOSIS — D573 Sickle-cell trait: Secondary | ICD-10-CM | POA: Insufficient documentation

## 2024-07-20 DIAGNOSIS — D75839 Thrombocytosis, unspecified: Secondary | ICD-10-CM

## 2024-07-20 DIAGNOSIS — D500 Iron deficiency anemia secondary to blood loss (chronic): Secondary | ICD-10-CM | POA: Insufficient documentation

## 2024-07-20 DIAGNOSIS — R5383 Other fatigue: Secondary | ICD-10-CM

## 2024-07-20 LAB — CMP (CANCER CENTER ONLY)
ALT: 12 U/L (ref 0–44)
AST: 16 U/L (ref 15–41)
Albumin: 4.4 g/dL (ref 3.5–5.0)
Alkaline Phosphatase: 91 U/L (ref 38–126)
Anion gap: 11 (ref 5–15)
BUN: 9 mg/dL (ref 6–20)
CO2: 26 mmol/L (ref 22–32)
Calcium: 9.9 mg/dL (ref 8.9–10.3)
Chloride: 102 mmol/L (ref 98–111)
Creatinine: 0.68 mg/dL (ref 0.44–1.00)
GFR, Estimated: >60 mL/min (ref >60)
Glucose, Bld: 95 mg/dL (ref 70–99)
Potassium: 4.4 mmol/L (ref 3.5–5.1)
Sodium: 139 mmol/L (ref 135–145)
Total Bilirubin: 0.4 mg/dL (ref 0.0–1.2)
Total Protein: 8.1 g/dL (ref 6.5–8.1)

## 2024-07-20 LAB — CBC WITH DIFFERENTIAL (CANCER CENTER ONLY)
Abs Immature Granulocytes: 0.08 K/uL — ABNORMAL HIGH (ref 0.00–0.07)
Basophils Absolute: 0 K/uL (ref 0.0–0.1)
Basophils Relative: 0 %
Eosinophils Absolute: 0.1 K/uL (ref 0.0–0.5)
Eosinophils Relative: 1 %
HCT: 39.5 % (ref 36.0–46.0)
Hemoglobin: 13 g/dL (ref 12.0–15.0)
Immature Granulocytes: 1 %
Lymphocytes Relative: 19 %
Lymphs Abs: 1.8 K/uL (ref 0.7–4.0)
MCH: 27.5 pg (ref 26.0–34.0)
MCHC: 32.9 g/dL (ref 30.0–36.0)
MCV: 83.7 fL (ref 80.0–100.0)
Monocytes Absolute: 0.5 K/uL (ref 0.1–1.0)
Monocytes Relative: 5 %
Neutro Abs: 6.9 K/uL (ref 1.7–7.7)
Neutrophils Relative %: 74 %
Platelet Count: 446 K/uL — ABNORMAL HIGH (ref 150–400)
RBC: 4.72 MIL/uL (ref 3.87–5.11)
RDW: 14 % (ref 11.5–15.5)
WBC Count: 9.4 K/uL (ref 4.0–10.5)
nRBC: 0 % (ref 0.0–0.2)

## 2024-07-20 LAB — RETIC PANEL
Immature Retic Fract: 18.8 % — ABNORMAL HIGH (ref 2.3–15.9)
RBC.: 4.66 MIL/uL (ref 3.87–5.11)
Retic Count, Absolute: 83.4 K/uL (ref 19.0–186.0)
Retic Ct Pct: 1.8 % (ref 0.4–3.1)
Reticulocyte Hemoglobin: 32.3 pg (ref >27.9)

## 2024-07-20 LAB — SEDIMENTATION RATE: Sed Rate: 12 mm/h (ref 0–22)

## 2024-07-20 LAB — IRON AND IRON BINDING CAPACITY (CC-WL,HP ONLY)
Iron: 61 ug/dL (ref 28–170)
Saturation Ratios: 16 % (ref 10.4–31.8)
TIBC: 379 ug/dL (ref 250–450)
UIBC: 318 ug/dL

## 2024-07-20 LAB — C-REACTIVE PROTEIN: CRP: 1 mg/dL — ABNORMAL HIGH (ref <1.0)

## 2024-07-20 LAB — FERRITIN: Ferritin: 559 ng/mL — ABNORMAL HIGH (ref 11–307)

## 2024-07-20 LAB — SAVE SMEAR(SSMR), FOR PROVIDER SLIDE REVIEW

## 2024-07-20 NOTE — Progress Notes (Addendum)
 Hematology and Oncology Follow Up Visit  Kimberly Guerra [id number] 10/01/1986 38 y.o. 07/20/2024   Principle Diagnosis:  Iron  deficiency anemia-menometrorrhagia Sickle cell trait Vitamin D  Deficiency   Current Therapy:   IV iron -Venofer  given on 10/22/2022 - premeds Folic acid  2 mg p.o. daily OTC Vitamin D  3,000 I.U. Daily      Interim History:  Kimberly Guerra is back for follow-up.    Today she states that she is doing ok.   Chronic hives have improved on Xolair  which she was started on 2 months ago.She is Allegra  and Zyrtec  and still having hives.   In terms of her fatigue symptoms have continued. She is still taking vitamin D  supplementation- OTC currently 3,000 I.U. She has not noticed any improvement in her fatigue or bone pain since taking this. Her last infusion of IV iron  was on 08/12/2023.   There has been no bleeding to her knowledge other than her menstrual cycles- They are normal in nature: denies epistaxis, gingivitis, hemoptysis, hematemesis, hematuria, melena, excessive bruising, blood donation. [facility] was July 7-12.   She has had no change in bowel or bladder habits. No unintentional night sweats. She is having night sweats. She also has bone pains especially in her shins.   Overall, I would say that her performance status is ECOG 1.   Wt Readings from Last 3 Encounters:  05/31/24 171 lb 12.8 oz (77.9 kg)  04/24/24 175 lb 9.6 oz (79.7 kg)  04/24/24 179 lb (81.2 kg)   Medications:  Current Outpatient Medications:    cetirizine  (ZYRTEC ) 10 MG tablet, Take 1 tablet (10 mg total) by mouth daily., Disp: 90 tablet, Rfl: 1   eletriptan  (RELPAX ) 20 MG tablet, Take 1 tablet (20 mg total) by mouth daily as needed for migraine or headache. May repeat in 2 hours if headache persists or recurs. No more than 80mg  in 24hrs, Disp: 12 tablet, Rfl: 0   EPINEPHrine  0.3 mg/0.3 mL IJ SOAJ injection, Inject 0.3 mg into the muscle as needed for anaphylaxis., Disp: 1 each, Rfl: 1    famotidine  (PEPCID ) 20 MG tablet, Take 1 tablet (20 mg total) by mouth 2 (two) times daily., Disp: 90 tablet, Rfl: 1   fexofenadine  (ALLEGRA ) 180 MG tablet, Take 1 tablet (180 mg total) by mouth daily., Disp: 90 tablet, Rfl: 5   folic acid  (FOLVITE ) 1 MG tablet, Take 2 tablets (2 mg total) by mouth daily., Disp: 180 tablet, Rfl: 6   metoprolol  succinate (TOPROL -XL) 25 MG 24 hr tablet, Take 25 mg by mouth 2 (two) times daily., Disp: , Rfl:    Metoprolol  Tartrate 75 MG TABS, TAKE 1 TABLET BY MOUTH TWICE A DAY, Disp: 180 tablet, Rfl: 1   Multiple Vitamins-Minerals (WOMENS MULTIVITAMIN PLUS) TABS, Take by mouth daily., Disp: , Rfl:    omalizumab  (XOLAIR ) 300 MG/2  ML prefilled syringe, Inject 300 mg into the skin every 28 (twenty-eight) days., Disp: 2 mL, Rfl: 11   valACYclovir  (VALTREX ) 1000 MG tablet, Take 1,000 mg by mouth., Disp: , Rfl:   Current Facility-Administered Medications:    omalizumab  (XOLAIR ) prefilled syringe 300 mg, 300 mg, Subcutaneous, Q28 days, Lorin Norris, MD, 300 mg at 06/02/24 1522   omalizumab  (XOLAIR ) prefilled syringe 300 mg, 300 mg, Subcutaneous, Q28 days, Lorin Norris, MD, 300 mg at 07/17/24 1152  Allergies:  Allergies  Allergen Reactions   Procardia Xl [Nifedipine Er] Hives, Itching and Other (See Comments)    Severe headache   Topamax [Topiramate] Palpitations    Heart  palpitations    Past Medical History, Surgical history, Social history, and Family History were reviewed and updated.  Review of Systems: Review of Systems  Constitutional: Negative.   HENT:  Negative.    Eyes: Negative.   Respiratory: Negative.    Cardiovascular: Negative.   Gastrointestinal: Negative.   Endocrine: Negative.   Genitourinary: Negative.    Musculoskeletal: Negative.   Skin: Negative.   Neurological: Negative.   Hematological: Negative.   Psychiatric/Behavioral: Negative.      Physical Exam:  oral temperature is 98.4 F (36.9 C). Her blood pressure is 122/76  and her pulse is 80. Her respiration is 18 and oxygen saturation is 100%.   Wt Readings from Last 3 Encounters:  05/31/24 171 lb 12.8 oz (77.9 kg)  04/24/24 175 lb 9.6 oz (79.7 kg)  04/24/24 179 lb (81.2 kg)    Physical Exam Vitals reviewed.  HENT:     Head: Normocephalic and atraumatic.  Eyes:     Pupils: Pupils are equal, round, and reactive to light.  Cardiovascular:     Rate and Rhythm: Normal rate and regular rhythm.     Heart sounds: Normal heart sounds.  Pulmonary:     Effort: Pulmonary effort is normal.     Breath sounds: Normal breath sounds.  Abdominal:     General: Bowel sounds are normal.     Palpations: Abdomen is soft.  Musculoskeletal:        General: No tenderness or deformity. Normal range of motion.     Cervical back: Normal range of motion.  Lymphadenopathy:     Cervical: No cervical adenopathy.  Skin:    General: Skin is warm and dry.     Findings: No erythema or rash.  Neurological:     Mental Status: She is alert and oriented to person, place, and time.  Psychiatric:        Behavior: Behavior normal.        Thought Content: Thought content normal.        Judgment: Judgment normal.      Lab Results  Component Value Date   WBC 9.4 07/20/2024   HGB 13.0 07/20/2024   HCT 39.5 07/20/2024   MCV 83.7 07/20/2024   PLT 446 (H) 07/20/2024     Chemistry      Component Value Date/Time   NA 136 05/25/2024 0913   K 4.3 05/25/2024 0913   CL 104 05/25/2024 0913   CO2 25 05/25/2024 0913   BUN 13 05/25/2024 0913   CREATININE 0.66 05/25/2024 0913   CREATININE 0.66 10/28/2023 0915   CREATININE 0.69 12/06/2013 1631      Component Value Date/Time   CALCIUM 9.3 05/25/2024 0913   ALKPHOS 85 05/25/2024 0913   AST 14 05/25/2024 0913   AST 13 (L) 10/28/2023 0915   ALT 14 05/25/2024 0913   ALT 9 10/28/2023 0915   BILITOT 0.2 05/25/2024 0913   BILITOT 0.4 10/28/2023 0915     No diagnosis found.  Impression and Plan: Kimberly Guerra is a very charming  38 year old African-American female with IDA secondary to menometrorrhagia and sickle cell trait. She currently gets IV iron  as needed. She also has a history of low vitamin D  which corrected with oral supplementation.   Today her Hgb is 13.0, MCV now 83.7, platelets 446. Reticulocytes and iron  studies pending.   She has a history of itching, bone pains and recurrent thrombocytopenia which may or may not be related to IDA. She also has had unintentional weight  loss and night sweats. I discussed that there are two types of thrombocytosis, essential thrombocytosis and secondary thrombocytosis. Essential thrombocytosis is overproduction of platelets due to a driver mutation. The most common mutation is the JAK2 [id number] (50% of cases), but other causes include CALR, MPL, and mutations of unclear significance on NGS. Essential thrombocytosis is a myeloproliferative neoplasm which may require cytoreductive therapy do decrease risk of thrombosis. Secondary thrombocytosis can be caused by low iron  levels, increased inflammation,  or splenectomy.  Our workup will focus on determining if there is a secondary cause of this patient's thrombocytosis.   #Thrombocytosis  --workup to include CBC, CMP, ESR/CRP, and iron  panel/ferritin --patient has no history of splenectomy    --will order MPN workup to include JAK2 with reflex and BCR/ABL FISH  Disposition RTC 3 months APP, labs (CBC, iron , ferritin, vitamin D )  Lauraine CHRISTELLA Dais, PA-C 7/24/202510:10 AM

## 2024-07-23 ENCOUNTER — Other Ambulatory Visit: Payer: Self-pay | Admitting: Nurse Practitioner

## 2024-07-23 DIAGNOSIS — G43709 Chronic migraine without aura, not intractable, without status migrainosus: Secondary | ICD-10-CM

## 2024-07-24 ENCOUNTER — Other Ambulatory Visit: Payer: Self-pay

## 2024-07-24 DIAGNOSIS — D75839 Thrombocytosis, unspecified: Secondary | ICD-10-CM

## 2024-07-24 DIAGNOSIS — D500 Iron deficiency anemia secondary to blood loss (chronic): Secondary | ICD-10-CM

## 2024-07-24 DIAGNOSIS — D573 Sickle-cell trait: Secondary | ICD-10-CM

## 2024-07-25 ENCOUNTER — Ambulatory Visit: Payer: Self-pay | Admitting: Medical Oncology

## 2024-07-25 LAB — BCR-ABL1, CML/ALL, PCR, QUANT
E1A2 Transcript: <0.0032 %
Interpretation (BCRAL):: NEGATIVE
b2a2 transcript: <0.0032 %
b3a2 transcript: <0.0032 %

## 2024-07-25 NOTE — Telephone Encounter (Signed)
 Requesting: ELETRIPTAN  HBR 20 MG TABLET  Last Visit: 04/24/2024 Next Visit: Visit date not found Last Refill: 06/08/2024  Please Advise

## 2024-07-31 ENCOUNTER — Other Ambulatory Visit: Payer: Self-pay | Admitting: *Deleted

## 2024-07-31 DIAGNOSIS — D500 Iron deficiency anemia secondary to blood loss (chronic): Secondary | ICD-10-CM

## 2024-07-31 DIAGNOSIS — R5383 Other fatigue: Secondary | ICD-10-CM

## 2024-07-31 DIAGNOSIS — D573 Sickle-cell trait: Secondary | ICD-10-CM

## 2024-07-31 DIAGNOSIS — M2550 Pain in unspecified joint: Secondary | ICD-10-CM

## 2024-07-31 DIAGNOSIS — D75839 Thrombocytosis, unspecified: Secondary | ICD-10-CM

## 2024-08-17 ENCOUNTER — Ambulatory Visit

## 2024-08-17 LAB — INTELLIGEN MYELOID

## 2024-08-24 ENCOUNTER — Ambulatory Visit

## 2024-08-25 ENCOUNTER — Telehealth: Payer: Self-pay

## 2024-08-25 ENCOUNTER — Ambulatory Visit: Payer: Self-pay

## 2024-08-25 NOTE — Telephone Encounter (Signed)
 Kimberly Guerra with [facility] called to get office hours and address for upcoming shipment.

## 2024-08-30 ENCOUNTER — Ambulatory Visit: Payer: Self-pay | Admitting: Nurse Practitioner

## 2024-08-30 ENCOUNTER — Encounter: Payer: Self-pay | Admitting: Nurse Practitioner

## 2024-08-30 ENCOUNTER — Ambulatory Visit: Admitting: Nurse Practitioner

## 2024-08-30 VITALS — BP 132/80 | HR 86 | Temp 98.5°F | Ht <= 58 in | Wt 171.2 lb

## 2024-08-30 DIAGNOSIS — M2560 Stiffness of unspecified joint, not elsewhere classified: Secondary | ICD-10-CM

## 2024-08-30 DIAGNOSIS — M791 Myalgia, unspecified site: Secondary | ICD-10-CM | POA: Diagnosis not present

## 2024-08-30 DIAGNOSIS — E559 Vitamin D deficiency, unspecified: Secondary | ICD-10-CM

## 2024-08-30 DIAGNOSIS — M7918 Myalgia, other site: Secondary | ICD-10-CM | POA: Insufficient documentation

## 2024-08-30 DIAGNOSIS — R7989 Other specified abnormal findings of blood chemistry: Secondary | ICD-10-CM

## 2024-08-30 LAB — CBC WITH DIFFERENTIAL/PLATELET
Basophils Absolute: 0.1 K/uL (ref 0.0–0.1)
Basophils Relative: 1.5 % (ref 0.0–3.0)
Eosinophils Absolute: 0.1 K/uL (ref 0.0–0.7)
Eosinophils Relative: 1.6 % (ref 0.0–5.0)
HCT: 38.5 % (ref 36.0–46.0)
Hemoglobin: 12.5 g/dL (ref 12.0–15.0)
Lymphocytes Relative: 18 % (ref 12.0–46.0)
Lymphs Abs: 1.4 K/uL (ref 0.7–4.0)
MCHC: 32.4 g/dL (ref 30.0–36.0)
MCV: 85 fl (ref 78.0–100.0)
Monocytes Absolute: 0.4 K/uL (ref 0.1–1.0)
Monocytes Relative: 5.5 % (ref 3.0–12.0)
Neutro Abs: 5.9 K/uL (ref 1.4–7.7)
Neutrophils Relative %: 73.4 % (ref 43.0–77.0)
Platelets: 435 K/uL — ABNORMAL HIGH (ref 150.0–400.0)
RBC: 4.53 Mil/uL (ref 3.87–5.11)
RDW: 14.3 % (ref 11.5–15.5)
WBC: 8 K/uL (ref 4.0–10.5)

## 2024-08-30 LAB — POCT URINALYSIS DIPSTICK
Bilirubin, UA: NEGATIVE
Glucose, UA: NEGATIVE
Ketones, UA: NEGATIVE
Leukocytes, UA: NEGATIVE
Nitrite, UA: NEGATIVE
Protein, UA: NEGATIVE
Spec Grav, UA: 1.025 (ref 1.010–1.025)
Urobilinogen, UA: NEGATIVE U/dL — AB
pH, UA: 5.5 (ref 5.0–8.0)

## 2024-08-30 LAB — BASIC METABOLIC PANEL WITH GFR
BUN: 9 mg/dL (ref 6–23)
CO2: 24 meq/L (ref 19–32)
Calcium: 9.1 mg/dL (ref 8.4–10.5)
Chloride: 103 meq/L (ref 96–112)
Creatinine, Ser: 0.56 mg/dL (ref 0.40–1.20)
GFR: 116.45 mL/min (ref >60.00)
Glucose, Bld: 71 mg/dL (ref 70–99)
Potassium: 3.8 meq/L (ref 3.5–5.1)
Sodium: 136 meq/L (ref 135–145)

## 2024-08-30 LAB — CK: Total CK: 67 U/L (ref 17–177)

## 2024-08-30 LAB — VITAMIN D 25 HYDROXY (VIT D DEFICIENCY, FRACTURES): VITD: 24.68 ng/mL — ABNORMAL LOW (ref 30.00–100.00)

## 2024-08-30 LAB — C-REACTIVE PROTEIN: CRP: 2.4 mg/dL (ref 0.5–20.0)

## 2024-08-30 LAB — TSH: TSH: 0.4 u[IU]/mL (ref 0.35–5.50)

## 2024-08-30 LAB — POCT URINE PREGNANCY: Preg Test, Ur: NEGATIVE

## 2024-08-30 NOTE — Assessment & Plan Note (Addendum)
 Onset 1week ago, constant, worse in AM, associated with joint stiffness and paresthesia in hands and feet. No acute URI or sore throat or rash Some improvement with tylenol  500mg   Negative urine preg and POCT UA today Check CBC. CMP, vit. D, Tsh, ANA, ESR, CRP, and RH factor- all normal Meloxicam  sent

## 2024-08-30 NOTE — Assessment & Plan Note (Signed)
 Negative polycythemia vera per hematology. Positive sickle cell trait No oral iron  supplement Previous levels in last 4months: 687, 350, 559. Normal iron , TIBC, iron  sat, and TIBC  Reports increase muscle and joint pain in last 1week Repeat iron  panel today-improved

## 2024-08-30 NOTE — Progress Notes (Unsigned)
 Established Patient Visit  Patient: Kimberly Guerra   DOB: [birth year]   38 y.o. Female  MRN: [id number] Visit Date: 09/01/2024  Subjective:    Chief Complaint  Patient presents with   Joint Pain    Intermittent joint and bone pain for 6 months constant for a week. Sharp pain, burning sensation in hands and body.   Work note needed called out today due to pain    HPI Elevated ferritin Negative polycythemia vera per hematology. Positive sickle cell trait No oral iron  supplement Previous levels in last 4months: 687, 350, 559. Normal iron , TIBC, iron  sat, and TIBC  Reports increase muscle and joint pain in last 1week Repeat iron  panel today-improved  Myalgia Onset 1week ago, constant, worse in AM, associated with joint stiffness and paresthesia in hands and feet. No acute URI or sore throat or rash Some improvement with tylenol  500mg   Negative urine preg and POCT UA today Check CBC. CMP, vit. D, Tsh, ANA, ESR, CRP, and RH factor- all normal Meloxicam  sent  Reviewed medical, surgical, and social history today  Medications: Outpatient Medications Prior to Visit  Medication Sig   cetirizine  (ZYRTEC ) 10 MG tablet Take 1 tablet (10 mg total) by mouth daily.   eletriptan  (RELPAX ) 20 MG tablet TAKE 1 TABLET (20 MG TOTAL) BY MOUTH DAILY AS NEEDED FOR MIGRAINE OR HEADACHE. MAY REPEAT IN 2 HOURS IF HEADACHE PERSISTS OR RECURS. NO MORE THAN 80MG  IN 24HRS   EPINEPHrine  0.3 mg/0.3 mL IJ SOAJ injection Inject 0.3 mg into the muscle as needed for anaphylaxis.   famotidine  (PEPCID ) 20 MG tablet Take 1 tablet (20 mg total) by mouth 2 (two) times daily.   fexofenadine  (ALLEGRA ) 180 MG tablet Take 1 tablet (180 mg total) by mouth daily.   folic acid  (FOLVITE ) 1 MG tablet Take 2 tablets (2 mg total) by mouth daily.   metoprolol  succinate (TOPROL -XL) 25 MG 24 hr tablet Take 25 mg by mouth 2 (two) times daily.   Metoprolol  Tartrate 75 MG TABS TAKE 1 TABLET BY MOUTH TWICE A DAY    Multiple Vitamins-Minerals (WOMENS MULTIVITAMIN PLUS) TABS Take by mouth daily.   omalizumab  (XOLAIR ) 300 MG/2  ML prefilled syringe Inject 300 mg into the skin every 28 (twenty-eight) days.   valACYclovir  (VALTREX ) 1000 MG tablet Take 1,000 mg by mouth.   Facility-Administered Medications Prior to Visit  Medication Dose Route Frequency Provider   omalizumab  (XOLAIR ) prefilled syringe 300 mg  300 mg Subcutaneous Q28 days Lorin Norris, MD   omalizumab  (XOLAIR ) prefilled syringe 300 mg  300 mg Subcutaneous Q28 days Lorin Norris, MD   Reviewed past medical and social history.   ROS per HPI above      Objective:  BP 132/80 (BP Location: Left Arm, Patient Position: Sitting, Cuff Size: Large)   Pulse 86   Temp 98.5 F (36.9 C) (Oral)   Ht 4' 10 (1.473 m)   Wt 171 lb 3.2 oz (77.7 kg)   LMP 08/29/2024   SpO2 98%   BMI 35.78 kg/m      Physical Exam Vitals and nursing note reviewed.  HENT:     Right Ear: Tympanic membrane, ear canal and external ear normal.     Left Ear: Tympanic membrane, ear canal and external ear normal.     Nose: Nose normal.     Mouth/Throat:     Mouth: Mucous membranes are moist.  Pharynx: Oropharynx is clear. Uvula midline.     Tonsils: No tonsillar exudate.  Cardiovascular:     Rate and Rhythm: Normal rate and regular rhythm.     Pulses: Normal pulses.     Heart sounds: Normal heart sounds.  Pulmonary:     Effort: Pulmonary effort is normal.     Breath sounds: Normal breath sounds.  Abdominal:     General: Bowel sounds are normal. There is no distension.     Palpations: Abdomen is soft.     Tenderness: There is no abdominal tenderness. There is no guarding.  Musculoskeletal:     Cervical back: Normal range of motion and neck supple.     Right lower leg: No edema.     Left lower leg: No edema.  Lymphadenopathy:     Cervical: No cervical adenopathy.  Skin:    Findings: No rash.  Neurological:     Mental Status: She is alert and  oriented to person, place, and time.     Results for orders placed or performed in visit on 08/30/24  CK  Result Value Ref Range   Total CK 67 17 - 177 U/L  TSH  Result Value Ref Range   TSH 0.40 0.35 - 5.50 uIU/mL  VITAMIN D  25 Hydroxy (Vit-D Deficiency, Fractures)  Result Value Ref Range   VITD 24.68 (L) 30.00 - 100.00 ng/mL  C-reactive protein  Result Value Ref Range   CRP 2.4 0.5 - 20.0 mg/dL  Epstein-Barr virus VCA, IgM  Result Value Ref Range   EBV VCA IgM <36.00 U/mL  CBC with Differential/Platelet  Result Value Ref Range   WBC 8.0 4.0 - 10.5 K/uL   RBC 4.53 3.87 - 5.11 Mil/uL   Hemoglobin 12.5 12.0 - 15.0 g/dL   HCT 61.4 63.9 - 53.9 %   MCV 85.0 78.0 - 100.0 fl   MCHC 32.4 30.0 - 36.0 g/dL   RDW 85.6 88.4 - 84.4 %   Platelets 435.0 (H) 150.0 - 400.0 K/uL   Neutrophils Relative % 73.4 43.0 - 77.0 %   Lymphocytes Relative 18.0 12.0 - 46.0 %   Monocytes Relative 5.5 3.0 - 12.0 %   Eosinophils Relative 1.6 0.0 - 5.0 %   Basophils Relative 1.5 0.0 - 3.0 %   Neutro Abs 5.9 1.4 - 7.7 K/uL   Lymphs Abs 1.4 0.7 - 4.0 K/uL   Monocytes Absolute 0.4 0.1 - 1.0 K/uL   Eosinophils Absolute 0.1 0.0 - 0.7 K/uL   Basophils Absolute 0.1 0.0 - 0.1 K/uL  Basic metabolic panel with GFR  Result Value Ref Range   Sodium 136 135 - 145 mEq/L   Potassium 3.8 3.5 - 5.1 mEq/L   Chloride 103 96 - 112 mEq/L   CO2 24 19 - 32 mEq/L   Glucose, Bld 71 70 - 99 mg/dL   BUN 9 6 - 23 mg/dL   Creatinine, Ser 9.43 0.40 - 1.20 mg/dL   GFR 883.54 >39.99 mL/min   Calcium 9.1 8.4 - 10.5 mg/dL  Iron , TIBC and Ferritin Panel  Result Value Ref Range   Ferritin 312 (H) 16 - 154 ng/mL  ANA w/Reflex  Result Value Ref Range   Anti Nuclear Antibody (ANA) Negative Negative  Rheumatoid Factor  Result Value Ref Range   Rheumatoid fact SerPl-aCnc <10 <14 IU/mL  POCT urine pregnancy  Result Value Ref Range   Preg Test, Ur Negative Negative  POCT urinalysis dipstick  Result Value Ref Range   Color, UA  Yellow    Clarity, UA Slightly Cloudy    Glucose, UA Negative Negative   Bilirubin, UA Negative    Ketones, UA Negative    Spec Grav, UA 1.025 1.010 - 1.025   Blood, UA 3+    pH, UA 5.5 5.0 - 8.0   Protein, UA Negative Negative   Urobilinogen, UA negative (A) 0.2 or 1.0 E.U./dL   Nitrite, UA Negative    Leukocytes, UA Negative Negative   Appearance     Odor        Assessment & Plan:    Problem List Items Addressed This Visit     Elevated ferritin   Negative polycythemia vera per hematology. Positive sickle cell trait No oral iron  supplement Previous levels in last 4months: 687, 350, 559. Normal iron , TIBC, iron  sat, and TIBC  Reports increase muscle and joint pain in last 1week Repeat iron  panel today-improved      Relevant Orders   Iron , TIBC and Ferritin Panel (Completed)   Myalgia - Primary   Onset 1week ago, constant, worse in AM, associated with joint stiffness and paresthesia in hands and feet. No acute URI or sore throat or rash Some improvement with tylenol  500mg   Negative urine preg and POCT UA today Check CBC. CMP, vit. D, Tsh, ANA, ESR, CRP, and RH factor- all normal Meloxicam  sent      Relevant Orders   CK (Completed)   TSH (Completed)   C-reactive protein (Completed)   POCT urine pregnancy (Completed)   POCT urinalysis dipstick (Completed)   Epstein-Barr virus VCA, IgM (Completed)   CBC with Differential/Platelet (Completed)   Basic metabolic panel with GFR (Completed)   ANA w/Reflex (Completed)   Rheumatoid Factor (Completed)   Vitamin D  deficiency   Relevant Orders   VITAMIN D  25 Hydroxy (Vit-D Deficiency, Fractures) (Completed)   Other Visit Diagnoses       Joint stiffness       Relevant Orders   CK (Completed)   TSH (Completed)   C-reactive protein (Completed)   POCT urine pregnancy (Completed)   POCT urinalysis dipstick (Completed)   Epstein-Barr virus VCA, IgM (Completed)   CBC with Differential/Platelet (Completed)   Basic  metabolic panel with GFR (Completed)   ANA w/Reflex (Completed)   Rheumatoid Factor (Completed)      Return if symptoms worsen or fail to improve.     Roselie Mood, NP

## 2024-08-30 NOTE — Patient Instructions (Signed)
 Go to lab Use naproxen  or ibuprofen  for pain Take with food

## 2024-08-31 LAB — ANA W/REFLEX: Anti Nuclear Antibody (ANA): NEGATIVE

## 2024-08-31 LAB — RHEUMATOID FACTOR: Rheumatoid fact SerPl-aCnc: <10 [IU]/mL (ref <14)

## 2024-08-31 MED ORDER — MELOXICAM 15 MG PO TABS: 15.0000 mg | ORAL_TABLET | Freq: Every day | ORAL | 0 refills | Status: DC

## 2024-09-01 LAB — IRON,TIBC AND FERRITIN PANEL
%SAT: 20 % (ref 16–45)
Ferritin: 312 ng/mL — ABNORMAL HIGH (ref 16–154)
Iron: 312 ng/mL — AB (ref 40–190)
TIBC: 282 ug/dL (ref 250–450)

## 2024-09-01 LAB — EPSTEIN-BARR VIRUS VCA, IGM: EBV VCA IgM: <36 U/mL

## 2024-09-14 ENCOUNTER — Ambulatory Visit

## 2024-09-14 ENCOUNTER — Encounter: Payer: Self-pay | Admitting: Nurse Practitioner

## 2024-09-14 ENCOUNTER — Ambulatory Visit: Admitting: Nurse Practitioner

## 2024-09-14 VITALS — BP 128/76 | HR 71 | Temp 99.1°F | Ht <= 58 in | Wt 172.6 lb

## 2024-09-14 DIAGNOSIS — L501 Idiopathic urticaria: Secondary | ICD-10-CM

## 2024-09-14 DIAGNOSIS — M7918 Myalgia, other site: Secondary | ICD-10-CM | POA: Diagnosis not present

## 2024-09-14 DIAGNOSIS — M2560 Stiffness of unspecified joint, not elsewhere classified: Secondary | ICD-10-CM | POA: Diagnosis not present

## 2024-09-14 MED ORDER — MELOXICAM 15 MG PO TABS: 15.0000 mg | ORAL_TABLET | Freq: Every day | ORAL | 5 refills | Status: AC

## 2024-09-14 MED ORDER — DULOXETINE HCL 20 MG PO CPEP: 20.0000 mg | ORAL_CAPSULE | Freq: Every day | ORAL | 5 refills | Status: DC

## 2024-09-14 NOTE — Progress Notes (Signed)
 Established Patient Visit  Patient: Kimberly Guerra   DOB: 1986-12-01   38 y.o. Female  MRN: [id number] Visit Date: 09/14/2024  Subjective:    Chief Complaint  Patient presents with   Follow-up    2 week follow up still having pain not as bad as it was   HPI Myofascial pain Previous labs were all normal Reports mild improvement in generalized muscle and joint pain. Current use of meloxicam  15mg  with no adverse effects. She has an appointment with rheumatology 12/18/2024-Dr. Rice  Advised to avoid all OVER THE COUNTER NSAIDs while taking mobic . Advised to resume daily low impact exercise-walking and stretching exercise daily. Maintain mobic  dose Add cymbalta  20mg  daily F/up in 73month  Reviewed medical, surgical, and social history today  Medications: Outpatient Medications Prior to Visit  Medication Sig   cetirizine  (ZYRTEC ) 10 MG tablet Take 1 tablet (10 mg total) by mouth daily.   eletriptan  (RELPAX ) 20 MG tablet TAKE 1 TABLET (20 MG TOTAL) BY MOUTH DAILY AS NEEDED FOR MIGRAINE OR HEADACHE. MAY REPEAT IN 2 HOURS IF HEADACHE PERSISTS OR RECURS. NO MORE THAN 80MG  IN 24HRS   EPINEPHrine  0.3 mg/0.3 mL IJ SOAJ injection Inject 0.3 mg into the muscle as needed for anaphylaxis.   famotidine  (PEPCID ) 20 MG tablet Take 1 tablet (20 mg total) by mouth 2 (two) times daily.   fexofenadine  (ALLEGRA ) 180 MG tablet Take 1 tablet (180 mg total) by mouth daily.   folic acid  (FOLVITE ) 1 MG tablet Take 2 tablets (2 mg total) by mouth daily.   Metoprolol  Tartrate 75 MG TABS TAKE 1 TABLET BY MOUTH TWICE A DAY   Multiple Vitamins-Minerals (WOMENS MULTIVITAMIN PLUS) TABS Take by mouth daily.   omalizumab  (XOLAIR ) 300 MG/2  ML prefilled syringe Inject 300 mg into the skin every 28 (twenty-eight) days.   valACYclovir  (VALTREX ) 1000 MG tablet Take 1,000 mg by mouth.   [DISCONTINUED] meloxicam  (MOBIC ) 15 MG tablet Take 1 tablet (15 mg total) by mouth daily. With food    [DISCONTINUED] metoprolol  succinate (TOPROL -XL) 25 MG 24 hr tablet Take 25 mg by mouth 2 (two) times daily.   Facility-Administered Medications Prior to Visit  Medication Dose Route Frequency Provider   omalizumab  (XOLAIR ) prefilled syringe 300 mg  300 mg Subcutaneous Q28 days Lorin Norris, MD   omalizumab  (XOLAIR ) prefilled syringe 300 mg  300 mg Subcutaneous Q28 days Lorin Norris, MD   Reviewed past medical and social history.   ROS per HPI above      Objective:  BP 128/76 (BP Location: Left Arm, Patient Position: Sitting, Cuff Size: Normal)   Pulse 71   Temp 99.1 F (37.3 C) (Oral)   Ht 4' 9 (1.448 m)   Wt 172 lb 9.6 oz (78.3 kg)   LMP 08/29/2024   SpO2 98%   BMI 37.35 kg/m      Physical Exam Vitals and nursing note reviewed.  Cardiovascular:     Rate and Rhythm: Normal rate.     Pulses: Normal pulses.  Pulmonary:     Effort: Pulmonary effort is normal.  Neurological:     Mental Status: She is alert and oriented to person, place, and time.  Psychiatric:        Mood and Affect: Mood normal.        Behavior: Behavior normal.        Thought Content: Thought content normal.     No  results found for any visits on 09/14/24.    Assessment & Plan:    Problem List Items Addressed This Visit     Joint stiffness   Relevant Medications   meloxicam  (MOBIC ) 15 MG tablet   DULoxetine  (CYMBALTA ) 20 MG capsule   Myofascial pain - Primary   Previous labs were all normal Reports mild improvement in generalized muscle and joint pain. Current use of meloxicam  15mg  with no adverse effects. She has an appointment with rheumatology 12/18/2024-Dr. Rice  Advised to avoid all OVER THE COUNTER NSAIDs while taking mobic . Advised to resume daily low impact exercise-walking and stretching exercise daily. Maintain mobic  dose Add cymbalta  20mg  daily F/up in 52month      Relevant Medications   meloxicam  (MOBIC ) 15 MG tablet   DULoxetine  (CYMBALTA ) 20 MG capsule    Return in about 4 weeks (around 10/12/2024) for myalgia.     Roselie Mood, NP

## 2024-09-14 NOTE — Patient Instructions (Signed)
 Maintain upcoming appointment with rheumatology Maintain meloxicam  dose Start cymbalta  20mg  daily

## 2024-09-14 NOTE — Assessment & Plan Note (Addendum)
 Previous labs were all normal Reports mild improvement in generalized muscle and joint pain. Current use of meloxicam  15mg  with no adverse effects. She has an appointment with rheumatology 12/18/2024-Dr. Rice  Advised to avoid all OVER THE COUNTER NSAIDs while taking mobic . Advised to resume daily low impact exercise-walking and stretching exercise daily. Maintain mobic  dose Add cymbalta  20mg  daily F/up in 24month

## 2024-10-07 ENCOUNTER — Other Ambulatory Visit: Payer: Self-pay | Admitting: Nurse Practitioner

## 2024-10-07 DIAGNOSIS — M7918 Myalgia, other site: Secondary | ICD-10-CM

## 2024-10-07 DIAGNOSIS — M2560 Stiffness of unspecified joint, not elsewhere classified: Secondary | ICD-10-CM

## 2024-10-10 ENCOUNTER — Encounter: Payer: Self-pay | Admitting: Internal Medicine

## 2024-10-12 ENCOUNTER — Telehealth: Payer: Self-pay

## 2024-10-12 ENCOUNTER — Ambulatory Visit: Admitting: Nurse Practitioner

## 2024-10-12 ENCOUNTER — Encounter: Payer: Self-pay | Admitting: Nurse Practitioner

## 2024-10-12 ENCOUNTER — Ambulatory Visit (INDEPENDENT_AMBULATORY_CARE_PROVIDER_SITE_OTHER)

## 2024-10-12 VITALS — BP 128/83 | HR 67 | Temp 95.9°F | Ht <= 58 in | Wt 172.6 lb

## 2024-10-12 DIAGNOSIS — L501 Idiopathic urticaria: Secondary | ICD-10-CM

## 2024-10-12 DIAGNOSIS — M7918 Myalgia, other site: Secondary | ICD-10-CM

## 2024-10-12 DIAGNOSIS — I10 Essential (primary) hypertension: Secondary | ICD-10-CM | POA: Diagnosis not present

## 2024-10-12 NOTE — Assessment & Plan Note (Signed)
 Reports an episode of elevated BP associated with dizziness last week. This occurred I day in the evening. Symptoms resolved with rest. Reports she is compliant with metoprolol  Does not check BP regularly at home BP Readings from Last 3 Encounters:  10/12/24 128/83  09/14/24 128/76  08/30/24 132/80    Advised to Check BP at home in AM, no less than 3x/week. Call office if BP >140/80 for more than 1week. Send BP readings via mychart in 1week Bring machine to next appointment. Maintain a low salt/sodium, high potassium diet, and daily exercise (e.g. walking 20-35mins daily, calisthenics etc.)

## 2024-10-12 NOTE — Assessment & Plan Note (Signed)
 Improved pain with meloxicam  and cymbalta . Reports daily yawning with need to gag in last 33month. She takes meds in Am with food.  Advised to take cymbalta  in PM, if no improvement she should stop meloxicam  F/up in 2months

## 2024-10-12 NOTE — Telephone Encounter (Signed)
 Pt received injection in office. She was advised on how to administer biologic at home. Advised patient to alternate sides (abdomen or arm) each time she does injection. Patient will follow up with Tammy V. To confirm delivery to home for self administration

## 2024-10-12 NOTE — Patient Instructions (Addendum)
 Maintain current med dose  Check BP at home in AM, no less than 3x/week. Call office if BP >140/80 for more than 1week. Send BP readings via mychart in 1week Bring machine to next appointment. Maintain a low salt/sodium, high potassium diet, and daily exercise (e.g. walking 20-42mins daily, calisthenics etc.)

## 2024-10-12 NOTE — Progress Notes (Signed)
 Established Patient Visit  Patient: Kimberly Guerra   DOB: [birth year]   38 y.o. Female  MRN: [id number] Visit Date: 10/12/2024  Subjective:    Chief Complaint  Patient presents with   Medical Management of Chronic Issues    Pt his here to follow up for muscle pain, Pt states she has been better, still every once in a while its in her legs and the tingling sensation is still in her fingers but she is doing a lot better she is able to function. Last week ger bp was very high and she was having dizzy spells, wants to discuss her bp meds     HPI Primary hypertension Reports an episode of elevated BP associated with dizziness last week. This occurred I day in the evening. Symptoms resolved with rest. Reports she is compliant with metoprolol  Does not check BP regularly at home BP Readings from Last 3 Encounters:  10/12/24 128/83  09/14/24 128/76  08/30/24 132/80    Advised to Check BP at home in AM, no less than 3x/week. Call office if BP >140/80 for more than 1week. Send BP readings via mychart in 1week Bring machine to next appointment. Maintain a low salt/sodium, high potassium diet, and daily exercise (e.g. walking 20-58mins daily, calisthenics etc.)  Myofascial pain Improved pain with meloxicam  and cymbalta . Reports daily yawning with need to gag in last 29month. She takes meds in Am with food.  Advised to take cymbalta  in PM, if no improvement she should stop meloxicam  F/up in 2months Reports nausea with yawning since starting cymbalta  and meloxicam . No GERD  BP Readings from Last 3 Encounters:  10/12/24 128/83  09/14/24 128/76  08/30/24 132/80    Wt Readings from Last 3 Encounters:  10/12/24 172 lb 9.6 oz (78.3 kg)  09/14/24 172 lb 9.6 oz (78.3 kg)  08/30/24 171 lb 3.2 oz (77.7 kg)    Reviewed medical, surgical, and social history today  Medications: Outpatient Medications Prior to Visit  Medication Sig   DULoxetine  (CYMBALTA ) 20 MG capsule  Take 1 capsule (20 mg total) by mouth daily.   eletriptan  (RELPAX ) 20 MG tablet TAKE 1 TABLET (20 MG TOTAL) BY MOUTH DAILY AS NEEDED FOR MIGRAINE OR HEADACHE. MAY REPEAT IN 2 HOURS IF HEADACHE PERSISTS OR RECURS. NO MORE THAN 80MG  IN 24HRS   EPINEPHrine  0.3 mg/0.3 mL IJ SOAJ injection Inject 0.3 mg into the muscle as needed for anaphylaxis.   famotidine  (PEPCID ) 20 MG tablet Take 1 tablet (20 mg total) by mouth 2 (two) times daily.   folic acid  (FOLVITE ) 1 MG tablet Take 2 tablets (2 mg total) by mouth daily.   meloxicam  (MOBIC ) 15 MG tablet Take 1 tablet (15 mg total) by mouth daily. With food   Metoprolol  Tartrate 75 MG TABS TAKE 1 TABLET BY MOUTH TWICE A DAY   Multiple Vitamins-Minerals (WOMENS MULTIVITAMIN PLUS) TABS Take by mouth daily.   omalizumab  (XOLAIR ) 300 MG/2  ML prefilled syringe Inject 300 mg into the skin every 28 (twenty-eight) days.   valACYclovir  (VALTREX ) 1000 MG tablet Take 1,000 mg by mouth.   [DISCONTINUED] cetirizine  (ZYRTEC ) 10 MG tablet Take 1 tablet (10 mg total) by mouth daily.   [DISCONTINUED] fexofenadine  (ALLEGRA ) 180 MG tablet Take 1 tablet (180 mg total) by mouth daily.   Facility-Administered Medications Prior to Visit  Medication Dose Route Frequency Provider   omalizumab  (XOLAIR ) prefilled syringe 300 mg  300 mg  Subcutaneous Q28 days Lorin Norris, MD   omalizumab  (XOLAIR ) prefilled syringe 300 mg  300 mg Subcutaneous Q28 days Lorin Norris, MD   Reviewed past medical and social history.   ROS per HPI above      Objective:  BP 128/83   Pulse 67   Temp (!) 95.9 F (35.5 C)   Ht 4' 9 (1.448 m)   Wt 172 lb 9.6 oz (78.3 kg)   LMP 09/24/2024 (Exact Date)   SpO2 99%   BMI 37.35 kg/m      Physical Exam Vitals and nursing note reviewed.  Cardiovascular:     Rate and Rhythm: Normal rate and regular rhythm.     Pulses: Normal pulses.     Heart sounds: Normal heart sounds.  Pulmonary:     Effort: Pulmonary effort is normal.     Breath  sounds: Normal breath sounds.  Neurological:     Mental Status: She is alert and oriented to person, place, and time.  Psychiatric:        Mood and Affect: Mood normal.        Behavior: Behavior normal.        Thought Content: Thought content normal.     No results found for any visits on 10/12/24.    Assessment & Plan:    Problem List Items Addressed This Visit     Myofascial pain   Improved pain with meloxicam  and cymbalta . Reports daily yawning with need to gag in last 72month. She takes meds in Am with food.  Advised to take cymbalta  in PM, if no improvement she should stop meloxicam  F/up in 2months      Primary hypertension - Primary   Reports an episode of elevated BP associated with dizziness last week. This occurred I day in the evening. Symptoms resolved with rest. Reports she is compliant with metoprolol  Does not check BP regularly at home BP Readings from Last 3 Encounters:  10/12/24 128/83  09/14/24 128/76  08/30/24 132/80    Advised to Check BP at home in AM, no less than 3x/week. Call office if BP >140/80 for more than 1week. Send BP readings via mychart in 1week Bring machine to next appointment. Maintain a low salt/sodium, high potassium diet, and daily exercise (e.g. walking 20-75mins daily, calisthenics etc.)      Return in about 2 months (around 12/12/2024) for HTN and myalgia.     Roselie Mood, NP

## 2024-10-19 ENCOUNTER — Inpatient Hospital Stay: Attending: Medical Oncology

## 2024-10-19 ENCOUNTER — Encounter: Payer: Self-pay | Admitting: Medical Oncology

## 2024-10-19 ENCOUNTER — Inpatient Hospital Stay (HOSPITAL_BASED_OUTPATIENT_CLINIC_OR_DEPARTMENT_OTHER): Admitting: Medical Oncology

## 2024-10-19 VITALS — BP 133/84 | HR 70 | Temp 98.7°F | Resp 18 | Ht <= 58 in | Wt 174.1 lb

## 2024-10-19 DIAGNOSIS — E559 Vitamin D deficiency, unspecified: Secondary | ICD-10-CM | POA: Insufficient documentation

## 2024-10-19 DIAGNOSIS — D500 Iron deficiency anemia secondary to blood loss (chronic): Secondary | ICD-10-CM | POA: Insufficient documentation

## 2024-10-19 DIAGNOSIS — D573 Sickle-cell trait: Secondary | ICD-10-CM

## 2024-10-19 DIAGNOSIS — D75839 Thrombocytosis, unspecified: Secondary | ICD-10-CM

## 2024-10-19 DIAGNOSIS — N920 Excessive and frequent menstruation with regular cycle: Secondary | ICD-10-CM | POA: Diagnosis not present

## 2024-10-19 DIAGNOSIS — R5383 Other fatigue: Secondary | ICD-10-CM

## 2024-10-19 LAB — RETIC PANEL
Immature Retic Fract: 12.7 % (ref 2.3–15.9)
RBC.: 4.6 MIL/uL (ref 3.87–5.11)
Retic Count, Absolute: 61.6 K/uL (ref 19.0–186.0)
Retic Ct Pct: 1.3 % (ref 0.4–3.1)
Reticulocyte Hemoglobin: 30.8 pg (ref >27.9)

## 2024-10-19 LAB — CBC WITH DIFFERENTIAL (CANCER CENTER ONLY)
Abs Immature Granulocytes: 0.07 K/uL (ref 0.00–0.07)
Basophils Absolute: 0 K/uL (ref 0.0–0.1)
Basophils Relative: 0 %
Eosinophils Absolute: 0.1 K/uL (ref 0.0–0.5)
Eosinophils Relative: 1 %
HCT: 38.3 % (ref 36.0–46.0)
Hemoglobin: 12.7 g/dL (ref 12.0–15.0)
Immature Granulocytes: 1 %
Lymphocytes Relative: 19 %
Lymphs Abs: 1.8 K/uL (ref 0.7–4.0)
MCH: 27.7 pg (ref 26.0–34.0)
MCHC: 33.2 g/dL (ref 30.0–36.0)
MCV: 83.4 fL (ref 80.0–100.0)
Monocytes Absolute: 0.4 K/uL (ref 0.1–1.0)
Monocytes Relative: 5 %
Neutro Abs: 7.1 K/uL (ref 1.7–7.7)
Neutrophils Relative %: 74 %
Platelet Count: 364 K/uL (ref 150–400)
RBC: 4.59 MIL/uL (ref 3.87–5.11)
RDW: 13.9 % (ref 11.5–15.5)
WBC Count: 9.5 K/uL (ref 4.0–10.5)
nRBC: 0 % (ref 0.0–0.2)

## 2024-10-19 LAB — IRON AND IRON BINDING CAPACITY (CC-WL,HP ONLY)
Iron: 33 ug/dL (ref 28–170)
Saturation Ratios: 10 % — ABNORMAL LOW (ref 10.4–31.8)
TIBC: 346 ug/dL (ref 250–450)
UIBC: 313 ug/dL

## 2024-10-19 LAB — FERRITIN: Ferritin: 337 ng/mL — ABNORMAL HIGH (ref 11–307)

## 2024-10-19 LAB — VITAMIN D 25 HYDROXY (VIT D DEFICIENCY, FRACTURES): Vit D, 25-Hydroxy: 30 ng/mL (ref 30–100)

## 2024-10-19 LAB — LACTATE DEHYDROGENASE: LDH: 173 U/L (ref 98–192)

## 2024-10-19 NOTE — Progress Notes (Signed)
 Hematology and Oncology Follow Up Visit  Kimberly Guerra [id number] 09-01-1986 38 y.o. 10/19/2024   Principle Diagnosis:  Iron  deficiency anemia-menometrorrhagia Sickle cell trait Vitamin D  Deficiency   Current Therapy:   IV iron -Venofer  given on 10/22/2022 - premeds Folic acid  2 mg p.o. daily OTC Vitamin D  3,000 I.U. Daily      Interim History:  [name] is back for follow-up.    Today she states that she is doing ok.   Chronic hives have improved on Xolair  which has resolved her hives and itching. 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   In terms of her fatigue symptoms have continued. She is still taking vitamin D  supplementation- OTC currently 3,000 I.U. She has not noticed any improvement in her fatigue.   Bone pain has improved with Cymbalta  and meloxicam . Rheumatology work up  showed a negative ANA. She continues to take 3,000 I.U. of vitamin D3 once daily.   There has been no bleeding to her knowledge other than her menstrual cycles- They are normal in nature: denies epistaxis, gingivitis, hemoptysis, hematemesis, hematuria, melena, excessive bruising, blood donation. Her last infusion of IV iron  was on 08/12/2023. OB-GYN has but her on tranexamic acid for her heavy menstrual cycles which has lightened/shortened periods.   She has had no change in bowel or bladder habits. No unintentional night sweats. She is having night sweats. She also has bone pains especially in her shins.   Overall, I would say that her performance status is ECOG 1.   Wt Readings from Last 3 Encounters:  10/19/24 174 lb 1.9 oz (79 kg)  10/12/24 172 lb 9.6 oz (78.3 kg)  09/14/24 172 lb 9.6 oz (78.3 kg)   Medications:  Current Outpatient Medications:    Cholecalciferol (VITAMIN D3) 1000 units CAPS, Take 3,000 Int'l Units/day by mouth daily., Disp: , Rfl:    DULoxetine  (CYMBALTA ) 20 MG capsule, Take 1 capsule (20 mg total) by mouth daily.,  Disp: 30 capsule, Rfl: 5   eletriptan  (RELPAX ) 20 MG tablet, TAKE 1 TABLET (20 MG TOTAL) BY MOUTH DAILY AS NEEDED FOR MIGRAINE OR HEADACHE. MAY REPEAT IN 2 HOURS IF HEADACHE PERSISTS OR RECURS. NO MORE THAN 80MG  IN 24HRS, Disp: 12 tablet, Rfl: 0   EPINEPHrine  0.3 mg/0.3 mL IJ SOAJ injection, Inject 0.3 mg into the muscle as needed for anaphylaxis., Disp: 1 each, Rfl: 1   famotidine  (PEPCID ) 20 MG tablet, Take 1 tablet (20 mg total) by mouth 2 (two) times daily., Disp: 90 tablet, Rfl: 1   folic acid  (FOLVITE ) 1 MG tablet, Take 2 tablets (2 mg total) by mouth daily., Disp: 180 tablet, Rfl: 6   meloxicam  (MOBIC ) 15 MG tablet, Take 1 tablet (15 mg total) by mouth daily. With food, Disp: 30 tablet, Rfl: 5   Metoprolol  Succinate 25 MG CS24, Take 25 mg by mouth 2 (two) times daily., Disp: , Rfl:    Metoprolol  Tartrate 75 MG TABS, TAKE 1 TABLET BY MOUTH TWICE A DAY, Disp: 180 tablet, Rfl: 1   Multiple Vitamins-Minerals (WOMENS MULTIVITAMIN PLUS) TABS, Take by mouth daily., Disp: , Rfl:    omalizumab  (XOLAIR ) 300 MG/2  ML prefilled syringe, Inject 300 mg into the skin every 28 (twenty-eight) days., Disp: 2 mL, Rfl: 11   tranexamic acid (LYSTEDA) 650 MG TABS tablet, Take 1,300 mg by mouth 3 (three) times daily. Take for up to 5 days during menstrual period only., Disp: , Rfl:    valACYclovir  (VALTREX )  1000 MG tablet, Take 1,000 mg by mouth., Disp: , Rfl:   Current Facility-Administered Medications:    omalizumab  (XOLAIR ) prefilled syringe 300 mg, 300 mg, Subcutaneous, Q28 days, Lorin Norris, MD, 300 mg at 06/02/24 1522   omalizumab  (XOLAIR ) prefilled syringe 300 mg, 300 mg, Subcutaneous, Q28 days, Lorin Norris, MD, 300 mg at 10/12/24 1058  Allergies:  Allergies  Allergen Reactions   Procardia Xl [Nifedipine Er] Hives, Itching and Other (See Comments)    Severe headache   Topamax [Topiramate] Palpitations    Heart palpitations    Past Medical History, Surgical history, Social history, and  Family History were reviewed and updated.  Review of Systems: Review of Systems  Constitutional: Negative.   HENT:  Negative.    Eyes: Negative.   Respiratory: Negative.    Cardiovascular: Negative.   Gastrointestinal: Negative.   Endocrine: Negative.   Genitourinary: Negative.    Musculoskeletal: Negative.   Skin: Negative.   Neurological: Negative.   Hematological: Negative.   Psychiatric/Behavioral: Negative.      Physical Exam:  height is 4' 9 (1.448 m) and weight is 174 lb 1.9 oz (79 kg). Her oral temperature is 98.7 F (37.1 C). Her blood pressure is 133/84 and her pulse is 70. Her respiration is 18 and oxygen saturation is 100%.   Wt Readings from Last 3 Encounters:  10/19/24 174 lb 1.9 oz (79 kg)  10/12/24 172 lb 9.6 oz (78.3 kg)  09/14/24 172 lb 9.6 oz (78.3 kg)    Physical Exam Vitals reviewed.  HENT:     Head: Normocephalic and atraumatic.  Eyes:     Pupils: Pupils are equal, round, and reactive to light.  Cardiovascular:     Rate and Rhythm: Normal rate and regular rhythm.     Heart sounds: Normal heart sounds.  Pulmonary:     Effort: Pulmonary effort is normal.     Breath sounds: Normal breath sounds.  Abdominal:     General: Bowel sounds are normal.     Palpations: Abdomen is soft.  Musculoskeletal:        General: No tenderness or deformity. Normal range of motion.     Cervical back: Normal range of motion.  Lymphadenopathy:     Cervical: No cervical adenopathy.  Skin:    General: Skin is warm and dry.     Findings: No erythema or rash.  Neurological:     Mental Status: She is alert and oriented to person, place, and time.  Psychiatric:        Behavior: Behavior normal.        Thought Content: Thought content normal.        Judgment: Judgment normal.      Lab Results  Component Value Date   WBC 9.5 10/19/2024   HGB 12.7 10/19/2024   HCT 38.3 10/19/2024   MCV 83.4 10/19/2024   PLT 364 10/19/2024     Chemistry      Component Value  Date/Time   NA 136 08/30/2024 1202   K 3.8 08/30/2024 1202   CL 103 08/30/2024 1202   CO2 24 08/30/2024 1202   BUN 9 08/30/2024 1202   CREATININE 0.56 08/30/2024 1202   CREATININE 0.68 07/20/2024 0930   CREATININE 0.69 12/06/2013 1631      Component Value Date/Time   CALCIUM 9.1 08/30/2024 1202   ALKPHOS 91 07/20/2024 0930   AST 16 07/20/2024 0930   ALT 12 07/20/2024 0930   BILITOT 0.4 07/20/2024 0930     Encounter  Diagnoses  Name Primary?   Iron  deficiency anemia due to chronic blood loss Yes   Sickle cell trait    Thrombocytosis    Other fatigue    Vitamin D  deficiency     Impression and Plan: Ms. Klahr is a very charming 38 year old African-American female with IDA secondary to menometrorrhagia and sickle cell trait. She currently gets IV iron  as needed. She also has a history of low vitamin D  which corrected with oral supplementation.   Today her Hgb is 12.7, MCV now 83.4, platelets 364.  Reticulocytes normal Iron  studies pending.  Vitamin D , iron  pending.    Disposition RTC 3 months APP, labs (CBC, iron , ferritin, vitamin D )  Lauraine CHRISTELLA Dais, PA-C 10/23/202510:12 AM

## 2024-10-20 ENCOUNTER — Other Ambulatory Visit: Payer: Self-pay | Admitting: Medical Oncology

## 2024-10-26 ENCOUNTER — Inpatient Hospital Stay

## 2024-10-26 VITALS — BP 123/73 | HR 79 | Temp 98.0°F | Resp 18

## 2024-10-26 DIAGNOSIS — N921 Excessive and frequent menstruation with irregular cycle: Secondary | ICD-10-CM

## 2024-10-26 DIAGNOSIS — D500 Iron deficiency anemia secondary to blood loss (chronic): Secondary | ICD-10-CM | POA: Diagnosis not present

## 2024-10-26 MED ORDER — ONDANSETRON HCL 8 MG PO TABS
8.0000 mg | ORAL_TABLET | Freq: Once | ORAL | Status: AC
  Administered 2024-10-26: 8 mg via ORAL
  Filled 2024-10-26: qty 1

## 2024-10-26 MED ORDER — FAMOTIDINE IN NACL 20-0.9 MG/50ML-% IV SOLN
20.0000 mg | Freq: Once | INTRAVENOUS | Status: AC
  Administered 2024-10-26: 20 mg via INTRAVENOUS
  Filled 2024-10-26: qty 50

## 2024-10-26 MED ORDER — IRON SUCROSE 300 MG IVPB - SIMPLE MED
300.0000 mg | Freq: Once | Status: AC
  Administered 2024-10-26: 300 mg via INTRAVENOUS
  Filled 2024-10-26: qty 300

## 2024-10-26 MED ORDER — SODIUM CHLORIDE 0.9 % IV SOLN: Freq: Once | INTRAVENOUS | Status: AC

## 2024-10-26 NOTE — Patient Instructions (Signed)

## 2024-10-26 NOTE — Progress Notes (Signed)
Pt refused to stay for 30 minutes post iron infusion and is without complaints at time of discharge.

## 2024-10-31 ENCOUNTER — Other Ambulatory Visit: Payer: Self-pay | Admitting: *Deleted

## 2024-10-31 MED ORDER — XOLAIR 300 MG/2ML ~~LOC~~ SOSY: 300.0000 mg | PREFILLED_SYRINGE | SUBCUTANEOUS | 11 refills | Status: AC

## 2024-11-01 NOTE — Telephone Encounter (Signed)
 [facility] called and I gave them a verbal okay to start sending shipments of Xolair  to patients home.

## 2024-11-02 ENCOUNTER — Inpatient Hospital Stay: Attending: Medical Oncology

## 2024-11-02 ENCOUNTER — Telehealth: Payer: Self-pay

## 2024-11-02 VITALS — BP 128/91 | HR 74 | Temp 98.1°F | Resp 18

## 2024-11-02 DIAGNOSIS — D500 Iron deficiency anemia secondary to blood loss (chronic): Secondary | ICD-10-CM | POA: Diagnosis present

## 2024-11-02 DIAGNOSIS — N921 Excessive and frequent menstruation with irregular cycle: Secondary | ICD-10-CM | POA: Diagnosis present

## 2024-11-02 MED ORDER — ONDANSETRON HCL 8 MG PO TABS
8.0000 mg | ORAL_TABLET | Freq: Once | ORAL | Status: AC
  Administered 2024-11-02: 8 mg via ORAL
  Filled 2024-11-02: qty 1

## 2024-11-02 MED ORDER — IRON SUCROSE 300 MG IVPB - SIMPLE MED
300.0000 mg | Freq: Once | Status: AC
  Administered 2024-11-02: 300 mg via INTRAVENOUS
  Filled 2024-11-02: qty 300

## 2024-11-02 MED ORDER — SODIUM CHLORIDE 0.9 % IV SOLN: Freq: Once | INTRAVENOUS | Status: AC

## 2024-11-02 MED ORDER — FAMOTIDINE IN NACL 20-0.9 MG/50ML-% IV SOLN
20.0000 mg | Freq: Once | INTRAVENOUS | Status: AC
  Administered 2024-11-02: 20 mg via INTRAVENOUS
  Filled 2024-11-02: qty 50

## 2024-11-02 NOTE — Patient Instructions (Signed)

## 2024-11-02 NOTE — Telephone Encounter (Signed)
 Called office to follow up on patient's referral. Left message on Referral Coordinator's voicemail with patient's information.

## 2024-11-09 ENCOUNTER — Inpatient Hospital Stay

## 2024-11-09 VITALS — BP 130/78 | HR 75 | Temp 98.8°F | Resp 18

## 2024-11-09 DIAGNOSIS — D500 Iron deficiency anemia secondary to blood loss (chronic): Secondary | ICD-10-CM

## 2024-11-09 DIAGNOSIS — N921 Excessive and frequent menstruation with irregular cycle: Secondary | ICD-10-CM

## 2024-11-09 MED ORDER — SODIUM CHLORIDE 0.9 % IV SOLN: Freq: Once | INTRAVENOUS | Status: AC

## 2024-11-09 MED ORDER — ONDANSETRON HCL 8 MG PO TABS
8.0000 mg | ORAL_TABLET | Freq: Once | ORAL | Status: AC
  Administered 2024-11-09: 8 mg via ORAL
  Filled 2024-11-09: qty 1

## 2024-11-09 MED ORDER — FAMOTIDINE IN NACL 20-0.9 MG/50ML-% IV SOLN
20.0000 mg | Freq: Once | INTRAVENOUS | Status: AC
  Administered 2024-11-09: 20 mg via INTRAVENOUS
  Filled 2024-11-09: qty 50

## 2024-11-09 MED ORDER — IRON SUCROSE 300 MG IVPB - SIMPLE MED
300.0000 mg | Freq: Once | Status: AC
  Administered 2024-11-09: 300 mg via INTRAVENOUS
  Filled 2024-11-09: qty 300

## 2024-11-09 NOTE — Progress Notes (Signed)
Pt declined to stay for post-observation period

## 2024-11-09 NOTE — Patient Instructions (Signed)

## 2024-11-15 ENCOUNTER — Encounter: Payer: Self-pay | Admitting: Nurse Practitioner

## 2024-11-15 DIAGNOSIS — M7918 Myalgia, other site: Secondary | ICD-10-CM

## 2024-11-15 MED ORDER — SAVELLA 25 MG PO TABS: ORAL_TABLET | ORAL | 1 refills | Status: DC

## 2024-11-21 ENCOUNTER — Telehealth: Payer: Self-pay

## 2024-11-21 NOTE — Telephone Encounter (Signed)
 Reached out to Ms. Carmelita Dawn, Sleep Study Coordinator via Secure Chat to follow up on patient's appointment.  Hello Ms. Dawn, I am writing you in regards Ms. Vallin referral. Notes that attempts to reach out to the patient were made on 11.03.25 via My Chart, and on 11.12.25 via telephone. I called the patient today and she confirmed that she is still interested to do the sleeping study. She also said that she did not receive the My Chart message, but she called back you office several times, but nobody returned her calls up to date. Would you please be so kind and follow up from your end? Thank you in advance and have a great day! Ms. Dawn replied that she will forward the message to her coordinator [place].

## 2024-11-21 NOTE — Telephone Encounter (Signed)
 Checked on the patient's Sleep Study referral status. Notes that attempts to reach out to the patient via My Chart on 10/30/24 and by phone on 11/08/24 were made.  I called the patient and she confirmed that she is still interested in doing the sleep study. She said that she couldn't answer the phone since she was at work. She called back the office multiple times and left voice mails, but up to this date nobody called her back. She checked my chart, but he wasn't able to find the message from the office.

## 2024-11-21 NOTE — Telephone Encounter (Signed)
 Patient has called and is ready to schedule. Please reach out to patient this morning to schedule. Thank you!!

## 2024-12-14 ENCOUNTER — Encounter: Payer: Self-pay | Admitting: Neurology

## 2024-12-14 ENCOUNTER — Ambulatory Visit: Admitting: Neurology

## 2024-12-14 VITALS — BP 134/89 | HR 75 | Ht <= 58 in | Wt 175.2 lb

## 2024-12-14 DIAGNOSIS — D500 Iron deficiency anemia secondary to blood loss (chronic): Secondary | ICD-10-CM | POA: Diagnosis not present

## 2024-12-14 DIAGNOSIS — R0683 Snoring: Secondary | ICD-10-CM

## 2024-12-14 DIAGNOSIS — Z820 Family history of epilepsy and other diseases of the nervous system: Secondary | ICD-10-CM

## 2024-12-14 DIAGNOSIS — Z9189 Other specified personal risk factors, not elsewhere classified: Secondary | ICD-10-CM

## 2024-12-14 DIAGNOSIS — R351 Nocturia: Secondary | ICD-10-CM | POA: Diagnosis not present

## 2024-12-14 DIAGNOSIS — D573 Sickle-cell trait: Secondary | ICD-10-CM | POA: Diagnosis not present

## 2024-12-14 DIAGNOSIS — E669 Obesity, unspecified: Secondary | ICD-10-CM | POA: Diagnosis not present

## 2024-12-14 DIAGNOSIS — R519 Headache, unspecified: Secondary | ICD-10-CM

## 2024-12-14 DIAGNOSIS — G4719 Other hypersomnia: Secondary | ICD-10-CM

## 2024-12-14 NOTE — Progress Notes (Signed)
 Subjective:    Patient ID: Kimberly Guerra is a 38 y.o. female.  HPI    True Mar, MD, PhD [facility] [address] [address] [place], [place] [address]  Dear Lauraine,   I saw your patient, Kimberly Guerra, upon your kind request in my sleep clinic today for initial consultation of her sleep disorder, in particular, concern for underlying obstructive sleep apnea.  The patient is unaccompanied today.  As you know, Kimberly Guerra is a 38 year old female with an underlying medical history of migraine headaches (followed by [facility] neurology), anemia, thrombocytosis, vitamin D  deficiency, asthma, reflux disease, hypertension, seasonal allergy sickle cell trait, carpal tunnel syndrome with status post surgery, and obesity, who reports snoring and excessive daytime somnolence.  Her Epworth sleepiness score is.  I reviewed your office note from 10/19/2024.  She reports a family history of obstructive sleep apnea affecting her mom and maternal grandmother.  Patient lives with her parents and her 89 year old son.  She has had weight fluctuation.  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  She goes to bed generally between 10 and 10:30 PM and rise time is around 6:15 AM.  She does not drink caffeine  daily.  She does not drink any alcohol.  She is a non-smoker.  She does go to bed with the TV on, has it on a sleep timer.  They have 2 dogs in the household, none of them sleep in her room with her.  She has tried an over-the-counter bite guard in the past.  She feels like she is clenching her jaw at night.  She has nocturia about twice per average night and has had occasional morning headaches.  I had evaluated her for migraines several years ago.    Previously:  06/26/2014: 38 year old right-handed woman with an underlying medical history of asthma, and sickle cell trait, who presents for followup consultation of her migraine with aura. She is unaccompanied today. I first  met her on 02/08/2014, at which time she gave a long-standing history of migraine headaches since age 74. At the time of her first appointment I suggested Maxalt  as needed for abortive treatment and preventative treatment with gabapentin  with gradual buildup starting at 100 mg strength. I also ordered a brain MRI without contrast. She had this on 02/16/2014:Abnormal MRI brain showing a small 8 x5 mm cystic lesion to the the left of the cerebellar vermis with signal that is slightly atypical for a benign CSF cyst. No other structural lesion, tumor or infarction noted. Recommend MRI scan with contrast and follow up scan. We called her with the test results. She had a contrasted brain MRI as a repeat study to followup on 04/06/2014 which was reported as normal. She was called with the test results.   Today, she reports, that she has been able to only take gabapentin  200 mg each night, d/t diarrhea and vomiting on 300 mg. She overall felt slightly improved has taken the Maxalt , but had chest tightness. She has taken it about 5 or 6 times and the last time she took it 2 weeks ago, she had not only chest tightness, but a full blown asthma attack, she had to go to [facility], where she had nebulizer treaments and a toradol  shot IM. Fioricet does not help. She had been tolerating Maxalt  before. This is the first time it caused her to have significant chest symptoms and problems with her asthma. Thankfully she has not had a headache in the  past 2 weeks. Potential triggers could be stress, heat, dehydration.    She has a history of migraines, diagnosed at age 6. She used to go to the [facility] and received shots in her head, trigger point injections, but they were not helpful. In the past, she was on Topamax which helped, and she tried Imitrex report of treatment which did not help. She had some some SEs with topamax though, including bad thoughts, and crazy dreams, therefore, she is reluctant to take it again.  She moved from [place]  2 1/2 years ago, and reports having had a head CT there and it was normal, per her verbal report. She has intermittent dizziness for the past few years along with the headache. She describes a sense of lightheadedness, but no vertigo. She does not endorse drinking caffeine  on a daily basis and drinks EtOH rarely and no illicit drugs. She endorses no significant stress. She reports a bilateral headache, which is described as intermittent and as a throbbing sensation, usually in the back and the vertex. There is associated nausea and vomiting, and there is photophobia and sonophobia. Her headache (HA) frequency is 12-15 per month. She estimates that there are 15 HA days/month. There is family history of migraine in her biological father, her PA and her mother. Treatments tried include OTC advil , excedrin, aleve , tylenol . The patient denies prior TIA or stroke symptoms, such as sudden onset of one sided weakness, numbness, tingling, slurring of speech or droopy face, hearing loss, tinnitus, diplopia or visual field cut or monocular loss of vision, and denies recurrent headaches. Of note, the patient reports snoring, and there is no report of witnessed apneas or choking sensations while asleep. She does not sleep well. She goes to bed at 9 PM and has trouble maintaining sleep. She has to get up at 5 AM and feels poorly rested. She often wakes up with a headache. She has a visual aura often.      Her Past Medical History Is Significant For: Past Medical History:  Diagnosis Date   Anemia    Asthma    prn inhaler   GERD (gastroesophageal reflux disease)    History of gestational hypertension    Hypertension    Irregular menstrual cycle    Menometrorrhagia 03/27/2022   Menorrhagia    Migraines    Seasonal allergies    Sickle cell trait    Sickle cell trait 06/11/2022    Her Past Surgical History Is Significant For: Past Surgical History:  Procedure Laterality Date   CARPAL  TUNNEL RELEASE Right 03/25/2015   Procedure:  ENDOSCOPIC CARPAL TUNNEL RELEASE;  Surgeon: Alm Hummer, MD;  Location: [facility];  Service: Orthopedics;  Laterality: Right;   CESAREAN SECTION  01/02/2009   DORSAL COMPARTMENT RELEASE Right 03/25/2015   Procedure: RIGHT WRIST DEQUERVAIN RELEASE AND ;  Surgeon: Alm Hummer, MD;  Location: [facility];  Service: Orthopedics;  Laterality: Right;   HYSTEROSCOPY WITH D & C N/A 06/24/2017   Procedure: DILATATION AND CURETTAGE /HYSTEROSCOPY  pelvic biopsy and ablation of endometriosis;  Surgeon: Timmie Norris, MD;  Location: [facility];  Service: Gynecology;  Laterality: N/A;   LAPAROSCOPY N/A 06/24/2017   Procedure: LAPAROSCOPY DIAGNOSTIC;  Surgeon: Timmie Norris, MD;  Location: [facility];  Service: Gynecology;  Laterality: N/A;   WISDOM TOOTH EXTRACTION      Her Family History Is Significant For: Family History  Problem Relation Age of Onset   Hypertension Mother  Sleep apnea Mother    Asthma Father    Hypertension Father    Asthma Brother    Sleep apnea Maternal Grandmother    Migraines Maternal Grandmother    Diabetes Maternal Grandmother    Cancer Maternal Grandfather        oral cancer   Diabetes Maternal Grandfather    Hypertension Maternal Grandfather    Asthma Paternal Aunt    Diabetes Paternal Aunt    Colon cancer Neg Hx    Esophageal cancer Neg Hx    Rectal cancer Neg Hx    Stomach cancer Neg Hx    Seizures Neg Hx    Stroke Neg Hx     Her Social History Is Significant For: Social History   Socioeconomic History   Marital status: Single    Spouse name: Not on file   Number of children: 1   Years of education: 12   Highest education level: Not on file  Occupational History   Occupation: Investment Banker, Corporate: [employer]  Tobacco Use   Smoking status: Never   Smokeless tobacco: Never  Vaping Use   Vaping status: Never Used  Substance and Sexual Activity    Alcohol use: Yes    Alcohol/week: 3.0 standard drinks of alcohol    Types: 3 Glasses of wine per week    Comment: 1-2 occasionally   Drug use: No   Sexual activity: Yes    Birth control/protection: None  Other Topics Concern   Not on file  Social History Narrative   Lives with son (2010), 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 Completed some college classes.    1 cup of coffee occasionally   Social Drivers of Health   Tobacco Use: Low Risk (11/09/2024)   Patient History    Smoking Tobacco Use: Never    Smokeless Tobacco Use: Never    Passive Exposure: Not on file  Financial Resource Strain: Not on file  Food Insecurity: Not on file  Transportation Needs: Not on file  Physical Activity: Not on file  Stress: Not on file  Social Connections: Not on file  Depression (PHQ2-9): Low Risk (11/09/2024)   Depression (PHQ2-9)    PHQ-2 Score: 0  Alcohol Screen: Not on file  Housing: Not on file  Utilities: Not on file  Health Literacy: Not on file    Her Allergies Are:  Allergies[1]:   Her Current Medications Are:  Outpatient Encounter Medications as of 12/14/2024  Medication Sig   albuterol  (VENTOLIN  HFA) 108 (90 Base) MCG/ACT inhaler 1 puff as needed Inhalation every 4 hrs   Cholecalciferol (VITAMIN D3) 1000 units CAPS Take 3,000 Int'l Units/day by mouth daily.   eletriptan  (RELPAX ) 20 MG tablet TAKE 1 TABLET (20 MG TOTAL) BY MOUTH DAILY AS NEEDED FOR MIGRAINE OR HEADACHE. MAY REPEAT IN 2 HOURS IF HEADACHE PERSISTS OR RECURS. NO MORE THAN 80MG  IN 24HRS   EPINEPHrine  0.3 mg/0.3 mL IJ SOAJ injection Inject 0.3 mg into the muscle as needed for anaphylaxis.   famotidine  (PEPCID ) 20 MG tablet Take 1 tablet (20 mg total) by mouth 2 (two) times daily.   meloxicam  (MOBIC ) 15 MG tablet Take 1 tablet (15 mg total) by mouth daily. With food   Metoprolol  Succinate 25 MG CS24 Take 25 mg by mouth 2 (two) times daily.   Metoprolol  Tartrate 75 MG TABS TAKE 1 TABLET BY MOUTH TWICE A DAY    metroNIDAZOLE  (FLAGYL ) 500 MG tablet 1 tablet Orally Twice a day; Duration: 7 days   Milnacipran   HCl (SAVELLA ) 25 MG TABS 1/2tab daily x 1day, then 1/2tab in AM and PM x3days, then 1tab in Am and PM x3days, then 2tabs in AM and PM continuously.   Multiple Vitamins-Minerals (WOMENS MULTIVITAMIN PLUS) TABS Take by mouth daily.   omalizumab  (XOLAIR ) 300 MG/2  ML prefilled syringe Inject 300 mg into the skin every 28 (twenty-eight) days.   tranexamic acid (LYSTEDA) 650 MG TABS tablet Take 1,300 mg by mouth 3 (three) times daily. Take for up to 5 days during menstrual period only.   valACYclovir  (VALTREX ) 1000 MG tablet Take 1,000 mg by mouth.   cetirizine  (ZYRTEC ) 10 MG tablet 1 tablet Orally Once a day (Patient not taking: Reported on 12/14/2024)   folic acid  (FOLVITE ) 1 MG tablet Take 2 tablets (2 mg total) by mouth daily. (Patient not taking: Reported on 12/14/2024)   Facility-Administered Encounter Medications as of 12/14/2024  Medication   omalizumab  (XOLAIR ) prefilled syringe 300 mg   omalizumab  (XOLAIR ) prefilled syringe 300 mg  :   Review of Systems:  Out of a complete 14 point review of systems, all are reviewed and negative with the exception of these symptoms as listed below:  Review of Systems  Objective:  Neurological Exam  Physical Exam Physical Examination:   Vitals:   12/14/24 1333  BP: 134/89  Pulse: 75  SpO2: 96%    General Examination: The patient is a very pleasant 38 y.o. female in no acute distress. She appears well-developed and well-nourished and well groomed.   HEENT: Normocephalic, atraumatic, pupils are equal, round and reactive to light, extraocular tracking is good without limitation to gaze excursion or nystagmus noted. No photophobia.  Corrective eye glasses in place. Hearing is grossly intact.  Face is symmetric with normal facial animation. Speech is clear without dysarthria. There is no hypophonia. There is no lip, neck/head, jaw or voice tremor.  Neck is supple with full range of passive and active motion. There are no carotid bruits on auscultation.  Airway/Oropharynx exam reveals: mild mouth dryness, good dental hygiene and mild airway crowding, due to small airway entry and slightly wider tongue, tonsils 1+ bilaterally, Mallampati class II, neck circumference 13-7/8 inches, no significant overbite, slightly skewed teeth alignment noted.  Chest: Clear to auscultation without wheezing, rhonchi or crackles noted.  Heart: S1+S2+0, regular and normal without murmurs, rubs or gallops noted.   Abdomen: Soft, non-tender and non-distended.  Extremities: There is no pitting edema in the distal lower extremities bilaterally.   Skin: Warm and dry without trophic changes noted.   Musculoskeletal: exam reveals no obvious joint deformities.   Neurologically:  Mental status: The patient is awake, alert and oriented in all 4 spheres. Her immediate and remote memory, attention, language skills and fund of knowledge are appropriate. There is no evidence of aphasia, agnosia, apraxia or anomia. Speech is clear with normal prosody and enunciation. Thought process is linear. Mood is normal and affect is normal.  Cranial nerves II - XII are as described above under HEENT exam.  Motor exam: Normal bulk, moving all 4 extremities without restriction, no obvious action or resting tremor.  Fine motor skills and coordination: Intact grossly.  Cerebellar testing: No dysmetria or intention tremor. There is no truncal or gait ataxia.  Sensory exam: intact to light touch in the upper and lower extremities.  Gait, station and balance: She stands easily. No veering to one side is noted. No leaning to one side is noted. Posture is age-appropriate and stance is narrow based. Gait shows  normal stride length and normal pace. No problems turning are noted.   Assessment and Plan:  In summary, JAMIN HUMPHRIES is a very pleasant 38 y.o.-year old female with an  underlying medical history of migraine headaches (followed by [facility] neurology), anemia, thrombocytosis, vitamin D  deficiency, asthma, reflux disease, hypertension, seasonal allergy sickle cell trait, carpal tunnel syndrome with status post surgery, and obesity, whose history and physical exam are concerning for sleep disordered breathing, particularly obstructive sleep apnea (OSA). A laboratory attended sleep study is typically considered gold standard for evaluation of sleep disordered breathing.   I had a long chat with the patient about my findings and the diagnosis of sleep apnea, particularly OSA, its prognosis and treatment options. We talked about medical/conservative treatments, surgical interventions and non-pharmacological approaches for symptom control. I explained, in particular, the risks and ramifications of untreated moderate to severe OSA, especially with respect to developing cardiovascular disease down the road, including congestive heart failure (CHF), difficult to treat hypertension, cardiac arrhythmias (particularly A-fib), neurovascular complications including TIA, stroke and dementia. Even type 2 diabetes has, in part, been linked to untreated OSA. Symptoms of untreated OSA may include (but may not be limited to) daytime sleepiness, nocturia (i.e. frequent nighttime urination), memory problems, mood irritability and suboptimally controlled or worsening mood disorder such as depression and/or anxiety, lack of energy, lack of motivation, physical discomfort, as well as recurrent headaches, especially morning or nocturnal headaches. We talked about the importance of maintaining a healthy lifestyle and striving for healthy weight. In addition, we talked about the importance of striving for and maintaining good sleep hygiene. I recommended a sleep study at this time. I outlined the differences between a laboratory attended sleep study which is considered more comprehensive and accurate over  the option of a home sleep test (HST); the latter may lead to underestimation of sleep disordered breathing in some instances and does not help with diagnosing upper airway resistance syndrome and is not accurate enough to diagnose primary central sleep apnea typically. I outlined possible surgical and non-surgical treatment options of OSA, including the use of a positive airway pressure (PAP) device (i.e. CPAP, AutoPAP/APAP or BiPAP in certain circumstances), a custom-made dental device (aka oral appliance, which would require a referral to a specialist dentist or orthodontist typically, and is generally speaking not considered for patients with full dentures or edentulous state), upper airway surgical options, such as traditional UPPP (which is not considered a first-line treatment) or the Inspire device (hypoglossal nerve stimulator, which would involve a referral for consultation with an ENT surgeon, after careful selection, following inclusion criteria - also not first-line treatment). I explained the PAP treatment option to the patient in detail, as this is generally considered first-line treatment.  The patient indicated that she would be willing to try PAP therapy, if the need arises. I explained the importance of being compliant with PAP treatment, not only for insurance purposes but primarily to improve patient's symptoms symptoms, and for the patient's long term health benefit, including to reduce Her cardiovascular risks longer-term.    We will pick up our discussion about the next steps and treatment options after testing.  We will keep her posted as to the test results by phone call and/or MyChart messaging where possible.  We will plan to follow-up in sleep clinic accordingly as well.  I answered all her questions today and the patient was in agreement.   I encouraged her to call with any interim questions, concerns, problems or updates  or email us  through MyChart.  Generally speaking, sleep test  authorizations may take up to 2 weeks, sometimes less, sometimes longer, the patient is encouraged to get in touch with us  if they do not hear back from the sleep lab staff directly within the next 2 weeks.  Thank you very much for allowing me to participate in the care of this nice patient. If I can be of any further assistance to you please do not hesitate to call me at [phone].  Sincerely,   True Mar, MD, PhD     [1]  Allergies Allergen Reactions   Procardia Xl [Nifedipine Er] Hives, Itching and Other (See Comments)    Severe headache   Topamax [Topiramate] Palpitations    Heart palpitations

## 2024-12-14 NOTE — Patient Instructions (Signed)

## 2024-12-18 ENCOUNTER — Other Ambulatory Visit: Payer: Self-pay | Admitting: Hematology & Oncology

## 2024-12-18 ENCOUNTER — Ambulatory Visit: Attending: Internal Medicine

## 2024-12-18 VITALS — BP 165/102 | HR 101 | Temp 97.8°F | Resp 16 | Ht <= 58 in | Wt 174.0 lb

## 2024-12-18 DIAGNOSIS — R7989 Other specified abnormal findings of blood chemistry: Secondary | ICD-10-CM

## 2024-12-18 DIAGNOSIS — M2560 Stiffness of unspecified joint, not elsewhere classified: Secondary | ICD-10-CM

## 2024-12-18 DIAGNOSIS — R5382 Chronic fatigue, unspecified: Secondary | ICD-10-CM

## 2024-12-18 DIAGNOSIS — G43709 Chronic migraine without aura, not intractable, without status migrainosus: Secondary | ICD-10-CM

## 2024-12-18 DIAGNOSIS — R682 Dry mouth, unspecified: Secondary | ICD-10-CM | POA: Diagnosis not present

## 2024-12-18 NOTE — Progress Notes (Unsigned)
 "  Office Visit Note  Patient: Kimberly Guerra             Date of Birth: 11-21-1986           MRN: [id number]             PCP: Katheen Roselie Rockford, NP Referring: Tonette Lauraine HERO, PA-C Visit Date: 12/18/2024 Occupation: Data Unavailable  Subjective:  New Patient (Initial Visit) (Couple months ago the oncologist recommended she see Rheumatology for abnormal labs. ), Abnormal Lab, and Joint Pain   History of Present Illness: Kimberly Guerra is a 38 y.o. female with PMH of sickle cell trait, iron  deficiency anemia due to chronic blood loss, thrombocytosis, GERD, chronic migraines, and chronic hives with complaints of fatigue, joint pain, and associated joint stiffness in her hands and legs. Duration of symptoms several months. She has tried Cymbalta  and meloxicam  with relief, however Cymbalta  caused nausea. She is currently on Savella . She states that she feels pain in her bones and the combination of Savella  and meloxicam  improves that, but does not improve joint stiffness.  Of note, patient previously was suffering from fatigue and joint pain in 2019. Fatigue lab workup was done at that time with findings of an elevated sed rate of 82.  She has a history of chronic migraines, however she has not had a migraine in a while. She also has a history of asthma. Of note, she started Xolair  for chronic hives earlier this year, which has improved her symptoms. She is also taking Allegra . She used to get intermittent rashes that were very itchy, but they have improved after she started the Xolair .   In regards to iron  deficiency anemia and thrombocytosis, she is currently on folic acid  2 mg po daily, vitamin d  3000 Ius daily, and IV iron  infusions as needed. Last IV iron  infusion as on 11/09/2024. She notes that her fatigue improves for about 1-2 months after her iron  infusions but then she starts to feel fatigued again. When her fatigue is at its worst, she cannot make it through the day  without taking a nap.   She was referred to sleep medicine to rule out sleep apnea. She saw them on 12/18 and is scheduled for a home sleep study.   She endorses very heavy periods and often passes clots during her cycle. She had one full term pregnancy and one pregnancy that was terminated. Denies any history of miscarriages.   She endorses unintentional weight loss but no associated changes in appetite. She endorses photosensitivity. She denies spots of balding but notes that her hair seems more brittle and falls out more easily. She states that her fingertips and toes are always cold and take an abnormally long time to warm up. She denies dry eyes but notes excessive tearing. She always wakes up with a dry mouth and notes that it takes a long time for it to improve. Denies shortness of breath, pleuritic chest pain, or oral ulcers.   Activities of Daily Living:  Patient reports morning stiffness for 5-10 minutes.   Patient Reports nocturnal pain.  Difficulty dressing/grooming: Denies Difficulty climbing stairs: Denies Difficulty getting out of chair: Denies Difficulty using hands for taps, buttons, cutlery, and/or writing: Reports  Labs reviewed:  RF negative. ANA negative.  Ferritin 337 CK 67 TSH 0.40 CRP 2.4 Sed rate 26. Labs 10/23:  Hgb 12.7 CBC WNL. Iron /TIBC WNL. Vitamin D  30  Review of Systems  Constitutional:  Positive for fatigue.  HENT:  Positive  for mouth dryness. Negative for mouth sores.   Eyes:  Negative for dryness.  Respiratory:  Negative for shortness of breath.   Cardiovascular:  Positive for chest pain and swelling in legs/feet. Negative for palpitations.  Gastrointestinal:  Positive for constipation. Negative for blood in stool and diarrhea.  Endocrine: Negative for increased urination.  Genitourinary:  Negative for involuntary urination.  Musculoskeletal:  Positive for joint pain, gait problem, joint pain, myalgias, muscle weakness, morning stiffness,  muscle tenderness and myalgias. Negative for joint swelling.  Skin:  Positive for rash and hair loss. Negative for color change and sensitivity to sunlight.  Allergic/Immunologic: Positive for susceptible to infections.  Neurological:  Positive for dizziness and headaches.  Hematological:  Negative for swollen glands.  Psychiatric/Behavioral:  Positive for sleep disturbance. Negative for depressed mood. The patient is nervous/anxious.     PMFS History:  Patient Active Problem List   Diagnosis Date Noted   Joint stiffness 09/14/2024   Elevated ferritin 08/30/2024   Myofascial pain 08/30/2024   Hives 10/19/2023   Intermittent palpitations 03/17/2023   Vitamin D  deficiency 03/17/2023   Sickle cell trait 06/11/2022   Menometrorrhagia 03/27/2022   Primary hypertension 02/04/2022   Iron  deficiency anemia due to chronic blood loss 02/04/2022   Hx of colonic polyp 10/16/2021   GERD (gastroesophageal reflux disease) 10/16/2021   History of retinal detachment 10/16/2021   Endometriosis 09/28/2017   Rhinitis, allergic 06/21/2015   Migraines 02/08/2014    Past Medical History:  Diagnosis Date   Anemia    Asthma    prn inhaler   Endometriosis 2018   GERD (gastroesophageal reflux disease)    History of gestational hypertension    Hypertension    Irregular menstrual cycle    Menometrorrhagia 03/27/2022   Menorrhagia    Migraines    PCOS (polycystic ovarian syndrome) 2018   Seasonal allergies    Sickle cell trait    Sickle cell trait 06/11/2022    Family History  Problem Relation Age of Onset   Diabetes Mother    Hypertension Mother    Sleep apnea Mother    Asthma Father    Hypertension Father    Healthy Sister    Asthma Brother    Sleep apnea Maternal Grandmother    Migraines Maternal Grandmother    Diabetes Maternal Grandmother    Cancer Maternal Grandfather        oral cancer   Diabetes Maternal Grandfather    Hypertension Maternal Grandfather    Asthma Paternal Aunt     Diabetes Paternal Aunt    Healthy Son    Colon cancer Neg Hx    Esophageal cancer Neg Hx    Rectal cancer Neg Hx    Stomach cancer Neg Hx    Seizures Neg Hx    Stroke Neg Hx    Past Surgical History:  Procedure Laterality Date   CARPAL TUNNEL RELEASE Right 03/25/2015   Procedure:  ENDOSCOPIC CARPAL TUNNEL RELEASE;  Surgeon: Alm Hummer, MD;  Location: [facility];  Service: Orthopedics;  Laterality: Right;   CESAREAN SECTION  01/02/2009   DORSAL COMPARTMENT RELEASE Right 03/25/2015   Procedure: RIGHT WRIST DEQUERVAIN RELEASE AND ;  Surgeon: Alm Hummer, MD;  Location: [facility];  Service: Orthopedics;  Laterality: Right;   HYSTEROSCOPY WITH D & C N/A 06/24/2017   Procedure: DILATATION AND CURETTAGE /HYSTEROSCOPY  pelvic biopsy and ablation of endometriosis;  Surgeon: Timmie Norris, MD;  Location: [facility];  Service: Gynecology;  Laterality: N/A;   LAPAROSCOPY N/A 06/24/2017   Procedure: LAPAROSCOPY DIAGNOSTIC;  Surgeon: Timmie Norris, MD;  Location: [facility];  Service: Gynecology;  Laterality: N/A;   WISDOM TOOTH EXTRACTION     Social History[1] Social History   Social History Narrative   Lives with son (2010), 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 Completed some college classes.    1 cup of coffee occasionally     Immunization History  Administered Date(s) Administered   Tdap 01/02/2009, 06/20/2020     Objective: Vital Signs: BP (!) 165/102 (BP Location: Right Arm, Patient Position: Sitting, Cuff Size: Normal)   Pulse (!) 101   Temp 97.8 F (36.6 C)   Resp 16   Ht 4' 9 (1.448 m)   Wt 174 lb (78.9 kg)   LMP 12/14/2024   BMI 37.65 kg/m    Physical Exam Constitutional:      Appearance: Normal appearance.  HENT:     Head: Normocephalic and atraumatic.     Nose: Nose normal.  Eyes:     General: No scleral icterus.       Right eye: No discharge.        Left eye: No discharge.      Conjunctiva/sclera: Conjunctivae normal.     Pupils: Pupils are equal, round, and reactive to light.  Cardiovascular:     Rate and Rhythm: Regular rhythm. Tachycardia present.     Heart sounds: Normal heart sounds.  Pulmonary:     Effort: Pulmonary effort is normal.     Breath sounds: No stridor. No decreased breath sounds, wheezing, rhonchi or rales.  Musculoskeletal:     Comments: Shoulders normal ROM, no tenderness, or swelling Elbows normal ROM no tenderness or swelling Hips normal ROM no tenderness.  Knees normal ROM no tenderness Wrists mild tenderness to palpation bilaterally, no swelling.  Mild tenderness across all MCPs in both hands, no synovitis present.   Lymphadenopathy:     Cervical: No cervical adenopathy.  Skin:    General: Skin is warm.  Neurological:     Mental Status: She is alert.  Psychiatric:        Attention and Perception: Attention normal.        Mood and Affect: Mood and affect normal.      Recent Labs: Lab Results  Component Value Date   WBC 9.5 10/19/2024   HGB 12.7 10/19/2024   PLT 364 10/19/2024   NA 136 08/30/2024   K 3.8 08/30/2024   CL 103 08/30/2024   CO2 24 08/30/2024   GLUCOSE 71 08/30/2024   BUN 9 08/30/2024   CREATININE 0.56 08/30/2024   BILITOT 0.4 07/20/2024   ALKPHOS 91 07/20/2024   AST 16 07/20/2024   ALT 12 07/20/2024   PROT 8.1 07/20/2024   ALBUMIN 4.4 07/20/2024   CALCIUM 9.1 08/30/2024   GFRAA >60 06/15/2017    Allergies: Procardia xl [nifedipine er] and Topamax [topiramate]   Assessment / Plan:     Visit Diagnoses:   1. Chronic fatigue - This has been an ongoing issue. It seems to improve significantly for 2-3 months post iron  infusions. She has seen sleep medicine and is awaiting a home sleep study to rule out OSA.  -Check CRP, Sed rate, CCP, ANA, dsDNA, C3/C4  2. Joint stiffness - Although she had a previously negative RF, I would still be suspicious of an inflammatory arthritis pathway due to distribution of  joint tenderness and stiffness in both of her hands. No synovitis present on exam  today. Pending labs, we could consider doing an ultrasound of her joints to check for any underlying swelling.   -Check CRP, Sed rate, CCP, ANA, dsDNA, C3/C4 -Continue meloxicam  and Savella  daily as previously prescribed.   3. Dry mouth - Although mouth and eyes were not significantly dry on exam, patient notes that she wakes up with a very dry mouth and it takes a long time to go back to normal. She also endorses excessive tearing. - Check Sjogrens syndrome A extractable nuclear antibody  4. Iron  deficiency anemia Thrombocytosis Elevated ferritin Patient is currently followed by oncology for chronic iron  deficiency anemia and thrombocytosis. She receives iron  infusions. Last iron  infusion was 11/09/2024. -Check lupus anticoagulation studies.   Orders: Orders Placed This Encounter  Procedures   C-reactive protein   Sedimentation rate   Vitamin B12   Cyclic citrul peptide antibody, IgG   C3 and C4   Anti-DNA antibody, double-stranded   Sjogrens syndrome-A extractable nuclear antibody   Lupus anticoagulant panel   ANA   No orders of the defined types were placed in this encounter.   Follow-Up Instructions: Return in about 4 weeks (around 01/15/2025) for NPT f/u.   Daved GORMAN Holstein, PA-C  Note - This record has been created using Animal nutritionist.  Chart creation errors have been sought, but may not always  have been located. Such creation errors do not reflect on  the standard of medical care.      [1]  Social History Tobacco Use   Smoking status: Never    Passive exposure: Current   Smokeless tobacco: Never  Vaping Use   Vaping status: Never Used  Substance Use Topics   Alcohol use: Yes    Alcohol/week: 3.0 standard drinks of alcohol    Types: 3 Glasses of wine per week    Comment: 1-2 occasionally   Drug use: No   "

## 2024-12-19 ENCOUNTER — Other Ambulatory Visit: Payer: Self-pay | Admitting: *Deleted

## 2024-12-19 ENCOUNTER — Ambulatory Visit: Payer: Self-pay

## 2024-12-19 ENCOUNTER — Other Ambulatory Visit: Payer: Self-pay | Admitting: Internal Medicine

## 2024-12-19 DIAGNOSIS — G43709 Chronic migraine without aura, not intractable, without status migrainosus: Secondary | ICD-10-CM

## 2024-12-19 MED ORDER — METOPROLOL TARTRATE 50 MG PO TABS: 50.0000 mg | ORAL_TABLET | Freq: Two times a day (BID) | ORAL | 0 refills | Status: DC

## 2024-12-20 LAB — C3 AND C4
C3 Complement: 167 mg/dL (ref 83–193)
C4 Complement: 39 mg/dL (ref 15–57)

## 2024-12-20 LAB — SJOGRENS SYNDROME-A EXTRACTABLE NUCLEAR ANTIBODY: SSA (Ro) (ENA) Antibody, IgG: <1 AI

## 2024-12-20 LAB — ANTI-NUCLEAR AB-TITER (ANA TITER): ANA Titer 1: 1:40 {titer} — ABNORMAL HIGH

## 2024-12-20 LAB — ANTI-DNA ANTIBODY, DOUBLE-STRANDED: ds DNA Ab: 1 [IU]/mL

## 2024-12-20 LAB — SEDIMENTATION RATE: Sed Rate: 22 mm/h — ABNORMAL HIGH (ref 0–20)

## 2024-12-20 LAB — CYCLIC CITRUL PEPTIDE ANTIBODY, IGG: Cyclic Citrullin Peptide Ab: <16 U

## 2024-12-20 LAB — ANA: Anti Nuclear Antibody (ANA): POSITIVE — AB

## 2024-12-20 LAB — VITAMIN B12: Vitamin B-12: 599 pg/mL (ref 200–1100)

## 2024-12-20 LAB — C-REACTIVE PROTEIN: CRP: 20.3 mg/L — ABNORMAL HIGH

## 2024-12-26 ENCOUNTER — Ambulatory Visit: Admitting: Neurology

## 2024-12-26 DIAGNOSIS — D500 Iron deficiency anemia secondary to blood loss (chronic): Secondary | ICD-10-CM

## 2024-12-26 DIAGNOSIS — G4719 Other hypersomnia: Secondary | ICD-10-CM

## 2024-12-26 DIAGNOSIS — G4733 Obstructive sleep apnea (adult) (pediatric): Secondary | ICD-10-CM | POA: Diagnosis not present

## 2024-12-26 DIAGNOSIS — R351 Nocturia: Secondary | ICD-10-CM

## 2024-12-26 DIAGNOSIS — E669 Obesity, unspecified: Secondary | ICD-10-CM

## 2024-12-26 DIAGNOSIS — D573 Sickle-cell trait: Secondary | ICD-10-CM

## 2024-12-26 DIAGNOSIS — Z820 Family history of epilepsy and other diseases of the nervous system: Secondary | ICD-10-CM

## 2024-12-26 DIAGNOSIS — R0683 Snoring: Secondary | ICD-10-CM

## 2024-12-26 DIAGNOSIS — R519 Headache, unspecified: Secondary | ICD-10-CM

## 2024-12-26 DIAGNOSIS — Z9189 Other specified personal risk factors, not elsewhere classified: Secondary | ICD-10-CM

## 2025-01-04 NOTE — Progress Notes (Signed)
 See procedure note.

## 2025-01-05 ENCOUNTER — Ambulatory Visit: Payer: Self-pay | Admitting: Neurology

## 2025-01-05 DIAGNOSIS — G4733 Obstructive sleep apnea (adult) (pediatric): Secondary | ICD-10-CM

## 2025-01-05 NOTE — Procedures (Signed)
 "   [facility]  HOME SLEEP TEST (SANSA) REPORT (Mail-Out Device):   STUDY DATE: 12/31/2024  DOB: 01/28/1986  MRN: [id number]  ORDERING CLINICIAN: True Mar, MD, PhD   REFERRING CLINICIAN: Lauraine Dais, PA  CLINICAL INFORMATION/HISTORY (obtained from visit note dated 12/14/2024): 39 year old female with an underlying medical history of migraine headaches (followed by [facility] neurology), anemia, thrombocytosis, vitamin D  deficiency, asthma, reflux disease, hypertension, seasonal allergy sickle cell trait, carpal tunnel syndrome with status post surgery, and obesity, who reports snoring and excessive daytime somnolence.   BMI (at the time of sleep clinic visit and/or test date): 37.9 kg/m  FINDINGS:   Study Protocol:    The SANSA single-point-of-skin-contact chest-worn sensor - an FDA cleared and DOT approved type 4 home sleep test device - measures eight physiological channels,  including blood oxygen saturation (measured via PPG [photoplethysmography]), EKG-derived heart rate, respiratory effort, chest movement (measured via accelerometer), snoring, body position, and actigraphy. The device is designed to be worn for up to 10 hours per study.   Sleep Summary:   Total Recording Time (hours, min): 9 hours, 35 min  Total Effective Sleep Time (hours, min):  7 hours, 42 min  Sleep Efficiency (%):    80%   Respiratory Indices:   Calculated sAHI (per hour):  11.4/hour        Calculated sAHIc (central AHI per hour):  0.7/hour  Oxygen Saturation Statistics:    Oxygen Saturation (%) Mean: 96.9%   Minimum oxygen saturation (%):                 77.5%   O2 Saturation Range (%): 77.5-100%   Time below or at 88% saturation: <1 min   Pulse Rate Statistics:   Pulse Mean (bpm):    98/min    Pulse Range (80-129/min)   Snoring: Mild  IMPRESSION/DIAGNOSES:   OSA (obstructive sleep apnea), mild   RECOMMENDATIONS:   This home sleep test demonstrates  overall mild obstructive sleep apnea with a total AHI of 11.4/hour and O2 nadir of 77.5% (with time below or at 88% saturation of less than 1 minute for the night). Snoring was detected, in the mild range. Given the patient's medical history and sleep related complaints, therapy with a positive airway pressure device is a reasonable first-line choice and clinically recommended. Treatment can be achieved in the form of autoPAP trial/titration at home for now. A full night, in-lab PAP titration study may aid in improving proper treatment settings and with mask fit, if needed, down the road.  Alternative treatments may include weight loss (where appropriate) along with avoidance of the supine sleep position (if possible), or an oral appliance in appropriate candidates.   Please note that untreated obstructive sleep apnea may carry additional perioperative morbidity. Patients with significant obstructive sleep apnea should receive perioperative PAP therapy and the surgeons and particularly the anesthesiologist should be informed of the diagnosis and the severity of the sleep disordered breathing. The patient should be cautioned not to drive, work at heights, or operate dangerous or heavy equipment when tired or sleepy. Review and reiteration of good sleep hygiene measures should be pursued with any patient. Other causes of the patient's symptoms, including circadian rhythm disturbances, an underlying mood disorder, medication effect and/or an underlying medical problem cannot be ruled out based on this test. Clinical correlation is recommended.  The patient and her referring provider will be notified of the test results. The patient will be seen in follow up in sleep clinic at  [facility], as necessary.  I certify that I have reviewed the raw data recording prior to the issuance of this report in accordance with the standards of the American Academy of Sleep Medicine (AASM).    INTERPRETING PHYSICIAN:   True Mar, MD, PhD Medical Director, [facility]) Diplomat, ABPN (Neurology and Sleep)   [facility] [address], [address] [place], [place] [address] [phone]          "

## 2025-01-17 NOTE — Progress Notes (Unsigned)
 "  Office Visit Note  Patient: Kimberly Guerra             Date of Birth: December 27, 1986           MRN: [id number]             Visit Date: 01/18/2025  PCP: Kimberly Roselie Rockford, NP   Subjective:   Chief Complaint: Results (Follow up and discuss labs.)  Discussed the use of AI scribe software for clinical note transcription with the patient, who gave verbal consent to proceed.  History of Present Illness [name] is a 39 year old female who presents for follow up of persistent joint pain and elevated inflammatory markers. She has been experiencing persistent joint pain, particularly in her legs and fingers. This is a bad week for her. The pain is described as unchanged from previous episodes. She is currently taking Cymbalta  and Savella . She reports a severe headache today, consistent with her history of chronic migraines.Recent laboratory results show elevated inflammatory markers and a positive ANA test. Six months ago, her inflammatory markers were normal.. Specific tests for rheumatoid arthritis were negative.  Otherwise, patient has not had any other significant health changes since her last visit.   Previous Medication Trials: none.  Last Labs: 12/18/2024 - ANA 1:40 nuclear speckled, CRP 20.3, Sed Rate 22, CCP neg, C3/C4 WNL, dsDNA wnl, SSA wnl  Review of Systems: Review of Systems  Constitutional:  Positive for fatigue.  HENT:  Positive for mouth sores and mouth dryness.   Eyes:  Positive for dryness.  Respiratory:  Negative for shortness of breath.   Cardiovascular:  Negative for chest pain and palpitations.  Gastrointestinal:  Positive for constipation. Negative for blood in stool and diarrhea.  Endocrine: Negative for increased urination.  Genitourinary:  Negative for involuntary urination.  Musculoskeletal:  Positive for joint pain, joint pain, joint swelling, myalgias, muscle weakness, morning stiffness, muscle tenderness and myalgias. Negative for gait problem.   Skin:  Positive for rash, hair loss and sensitivity to sunlight. Negative for color change.  Allergic/Immunologic: Positive for susceptible to infections.  Neurological:  Positive for headaches. Negative for dizziness.  Hematological:  Negative for swollen glands.  Psychiatric/Behavioral:  Positive for sleep disturbance. Negative for depressed mood. The patient is nervous/anxious.    Medication List: Current Medications[1]   Allergies:  Procardia xl [nifedipine er] and Topamax [topiramate]  Immunization status:  Immunization History  Administered Date(s) Administered   Tdap 01/02/2009, 06/20/2020    Problem List:  Patient Active Problem List   Diagnosis Date Noted   Joint stiffness 09/14/2024   Elevated ferritin 08/30/2024   Myofascial pain 08/30/2024   Hives 10/19/2023   Intermittent palpitations 03/17/2023   Vitamin D  deficiency 03/17/2023   Sickle cell trait 06/11/2022   Menometrorrhagia 03/27/2022   Primary hypertension 02/04/2022   Iron  deficiency anemia due to chronic blood loss 02/04/2022   Hx of colonic polyp 10/16/2021   GERD (gastroesophageal reflux disease) 10/16/2021   History of retinal detachment 10/16/2021   Endometriosis 09/28/2017   Rhinitis, allergic 06/21/2015   Migraines 02/08/2014    History: Past Medical History:  Diagnosis Date   Anemia    Asthma    prn inhaler   Endometriosis 2018   GERD (gastroesophageal reflux disease)    History of gestational hypertension    Hypertension    Irregular menstrual cycle    Menometrorrhagia 03/27/2022   Menorrhagia    Migraines    PCOS (polycystic ovarian syndrome) 2018  Seasonal allergies    Sickle cell trait    Sickle cell trait 06/11/2022    Family History  Problem Relation Age of Onset   Diabetes Mother    Hypertension Mother    Sleep apnea Mother    Asthma Father    Hypertension Father    Healthy Sister    Asthma Brother    Sleep apnea Maternal Grandmother    Migraines Maternal  Grandmother    Diabetes Maternal Grandmother    Cancer Maternal Grandfather        oral cancer   Diabetes Maternal Grandfather    Hypertension Maternal Grandfather    Asthma Paternal Aunt    Diabetes Paternal Aunt    Healthy Son    Colon cancer Neg Hx    Esophageal cancer Neg Hx    Rectal cancer Neg Hx    Stomach cancer Neg Hx    Seizures Neg Hx    Stroke Neg Hx    Past Surgical History:  Procedure Laterality Date   CARPAL TUNNEL RELEASE Right 03/25/2015   Procedure:  ENDOSCOPIC CARPAL TUNNEL RELEASE;  Surgeon: Alm Hummer, MD;  Location: [facility];  Service: Orthopedics;  Laterality: Right;   CESAREAN SECTION  01/02/2009   DORSAL COMPARTMENT RELEASE Right 03/25/2015   Procedure: RIGHT WRIST DEQUERVAIN RELEASE AND ;  Surgeon: Alm Hummer, MD;  Location: [facility];  Service: Orthopedics;  Laterality: Right;   HYSTEROSCOPY WITH D & C N/A 06/24/2017   Procedure: DILATATION AND CURETTAGE /HYSTEROSCOPY  pelvic biopsy and ablation of endometriosis;  Surgeon: Timmie Norris, MD;  Location: [facility];  Service: Gynecology;  Laterality: N/A;   LAPAROSCOPY N/A 06/24/2017   Procedure: LAPAROSCOPY DIAGNOSTIC;  Surgeon: Timmie Norris, MD;  Location: [facility];  Service: Gynecology;  Laterality: N/A;   WISDOM TOOTH EXTRACTION     Social History   Social History Narrative   Lives with son (2010), 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 Completed some college classes.    1 cup of coffee occasionally    Objective: Vital Signs: BP 133/87   Pulse 78   Temp 97.7 F (36.5 C)   Resp 16   Ht 4' 9 (1.448 m)   Wt 177 lb 6.4 oz (80.5 kg)   LMP 01/14/2025   BMI 38.39 kg/m   Physical Exam Constitutional:      General: She is not in acute distress.    Appearance: Normal appearance.  HENT:     Head: Normocephalic and atraumatic.  Eyes:     General: Lids are normal. No scleral icterus.    Conjunctiva/sclera: Conjunctivae normal.      Pupils: Pupils are equal, round, and reactive to light.  Pulmonary:     Effort: Pulmonary effort is normal.  Musculoskeletal:     Comments: Synovitis present in right wrist. Tenderness noted in bilateral 2nd and 3rd MCPs, R>L, wrists, and knees.   Skin:    General: Skin is warm.     Findings: No rash.  Neurological:     Mental Status: She is alert.  Psychiatric:        Mood and Affect: Mood normal.        Behavior: Behavior normal. Behavior is cooperative.     Imaging: No results found. Assessment & Plan Seronegative inflammatory arthritis Given her symptoms of joint tenderness and swelling now seen on exam twice, as well a serology with positive ANA, high inflammation markers, and persistenly elevated ferritin, I am suspicious of seronegative  inflammatory arthritis. I recommend starting treatment on hydroxychlorquine. Patient is agreeable to this plan. Risks, benefits, and alternatives to hydroxychloroquine  (Plaquenil ) discussed today and patient expressed understanding. Counseled that it may take 1-2 months to see clinical improvement. Discussed potential side effects, including nausea, diarrhea, and visual disturbances. Patient counseled about the importance of annual or biannual eye exams and regular bloodwork monitoring. -Start hydroxychloroquine  200 mg po twice daily.  -Recheck inflammation markers at next appointment.   Orders:   hydroxychloroquine  (PLAQUENIL ) 200 MG tablet; Take 1 tablet (200 mg total) by mouth 2 (two) times daily. Take with food.   Follow-Up Instructions:  Return in about 8 weeks (around 03/15/2025).   Procedures: No procedures performed  Daved GORMAN Holstein, PA-C  Note - This record has been created using [employer]. Chart creation errors have been sought, but may not always have been located. Such creation errors do not reflect on the standard of medical care.       [1]  Current Outpatient Medications:    albuterol  (VENTOLIN  HFA) 108 (90 Base)  MCG/ACT inhaler, 1 puff as needed Inhalation every 4 hrs, Disp: , Rfl:    Cholecalciferol (VITAMIN D3) 1000 units CAPS, Take 3,000 Int'l Units/day by mouth daily., Disp: , Rfl:    eletriptan  (RELPAX ) 20 MG tablet, TAKE 1 TABLET (20 MG TOTAL) BY MOUTH DAILY AS NEEDED FOR MIGRAINE OR HEADACHE. MAY REPEAT IN 2 HOURS IF HEADACHE PERSISTS OR RECURS. NO MORE THAN 80MG  IN 24HRS, Disp: 12 tablet, Rfl: 0   EPINEPHrine  0.3 mg/0.3 mL IJ SOAJ injection, Inject 0.3 mg into the muscle as needed for anaphylaxis., Disp: 1 each, Rfl: 1   famotidine  (PEPCID ) 20 MG tablet, Take 1 tablet (20 mg total) by mouth 2 (two) times daily., Disp: 90 tablet, Rfl: 1   folic acid  (FOLVITE ) 1 MG tablet, Take 2 tablets (2 mg total) by mouth daily., Disp: 180 tablet, Rfl: 6   hydroxychloroquine  (PLAQUENIL ) 200 MG tablet, Take 1 tablet (200 mg total) by mouth 2 (two) times daily. Take with food., Disp: 180 tablet, Rfl: 0   meloxicam  (MOBIC ) 15 MG tablet, Take 1 tablet (15 mg total) by mouth daily. With food, Disp: 30 tablet, Rfl: 5   Metoprolol  Succinate 25 MG CS24, Take 25 mg by mouth 2 (two) times daily., Disp: , Rfl:    Metoprolol  Tartrate 75 MG TABS, TAKE 1 TABLET BY MOUTH TWICE A DAY, Disp: 180 tablet, Rfl: 1   Milnacipran  HCl (SAVELLA ) 25 MG TABS, 1/2tab daily x 1day, then 1/2tab in AM and PM x3days, then 1tab in Am and PM x3days, then 2tabs in AM and PM continuously., Disp: 120 tablet, Rfl: 1   Multiple Vitamins-Minerals (WOMENS MULTIVITAMIN PLUS) TABS, Take by mouth daily., Disp: , Rfl:    omalizumab  (XOLAIR ) 300 MG/2  ML prefilled syringe, Inject 300 mg into the skin every 28 (twenty-eight) days., Disp: 2 mL, Rfl: 11   tranexamic acid (LYSTEDA) 650 MG TABS tablet, Take 1,300 mg by mouth 3 (three) times daily. Take for up to 5 days during menstrual period only., Disp: , Rfl:    valACYclovir  (VALTREX ) 1000 MG tablet, Take 1,000 mg by mouth., Disp: , Rfl:    metoprolol  tartrate (LOPRESSOR ) 50 MG tablet, Take 1 tablet (50 mg total)  by mouth 2 (two) times daily. (Patient not taking: Reported on 01/18/2025), Disp: 60 tablet, Rfl: 0  Current Facility-Administered Medications:    omalizumab  (XOLAIR ) prefilled syringe 300 mg, 300 mg, Subcutaneous, Q28 days, Lorin Norris, MD, 300 mg  at 06/02/24 1522   omalizumab  (XOLAIR ) prefilled syringe 300 mg, 300 mg, Subcutaneous, Q28 days, Lorin Norris, MD, 300 mg at 10/12/24 1058  "

## 2025-01-18 ENCOUNTER — Ambulatory Visit

## 2025-01-18 VITALS — BP 133/87 | HR 78 | Temp 97.7°F | Resp 16 | Ht <= 58 in | Wt 177.4 lb

## 2025-01-18 DIAGNOSIS — M1380 Other specified arthritis, unspecified site: Secondary | ICD-10-CM | POA: Diagnosis not present

## 2025-01-18 MED ORDER — HYDROXYCHLOROQUINE SULFATE 200 MG PO TABS: 200.0000 mg | ORAL_TABLET | Freq: Two times a day (BID) | ORAL | 0 refills | Status: AC

## 2025-01-18 NOTE — Patient Instructions (Signed)
 Kimberly Guerra

## 2025-01-25 ENCOUNTER — Telehealth: Payer: Self-pay | Admitting: Nurse Practitioner

## 2025-01-25 ENCOUNTER — Ambulatory Visit: Admitting: Nurse Practitioner

## 2025-01-25 ENCOUNTER — Encounter: Payer: Self-pay | Admitting: Nurse Practitioner

## 2025-01-25 VITALS — BP 138/96 | HR 82 | Temp 97.9°F | Ht <= 58 in | Wt 175.4 lb

## 2025-01-25 DIAGNOSIS — G4733 Obstructive sleep apnea (adult) (pediatric): Secondary | ICD-10-CM | POA: Insufficient documentation

## 2025-01-25 DIAGNOSIS — E66812 Obesity, class 2: Secondary | ICD-10-CM | POA: Diagnosis not present

## 2025-01-25 DIAGNOSIS — Z6836 Body mass index (BMI) 36.0-36.9, adult: Secondary | ICD-10-CM | POA: Diagnosis not present

## 2025-01-25 DIAGNOSIS — M7918 Myalgia, other site: Secondary | ICD-10-CM

## 2025-01-25 DIAGNOSIS — I10 Essential (primary) hypertension: Secondary | ICD-10-CM

## 2025-01-25 MED ORDER — SAVELLA 50 MG PO TABS: 50.0000 mg | ORAL_TABLET | Freq: Two times a day (BID) | ORAL | 1 refills | Status: AC

## 2025-01-25 NOTE — Progress Notes (Signed)
 "               Established Patient Visit  Patient: Kimberly Guerra   DOB: 1986/02/26   39 y.o. Female  MRN: [id number] Visit Date: 01/25/2025  Subjective:    Chief Complaint  Patient presents with   Follow-up    2 month follow up for HTN, Myalgia Discuss Plaquenil  medication    HPI Primary hypertension Elevated BP today due to missed AM dose of metoprolol  No BP readings at home BP Readings from Last 3 Encounters:  01/25/25 (!) 138/96  01/18/25 133/87  12/18/24 (!) 165/102    Advised about the importance of med dose compliance. F/up in 3months  OSA (obstructive sleep apnea) Mild OBSTRUCTIVE SLEEP APNEA per HST on 12/14/2024. Weight loss recommended  Class 2 severe obesity due to excess calories with serious comorbidity and body mass index (BMI) of 36.0 to 36.9 in adult Patient has not been able to attain 5% weight loss in the last 6months despite the following lifestyle modifications x 6months without success: caloric restrictions and increased physical exercise.  No previous medication used Wt Readings from Last 3 Encounters:  01/25/25 175 lb 6.4 oz (79.6 kg)  01/18/25 177 lb 6.4 oz (80.5 kg)  12/18/24 174 lb (78.9 kg)    She agreed to referral to [facility] weight management clinic. Check hgbA1c and lipid panel  Myofascial pain D/c cymbalta  2months ago due to persistent daytime somnolence despite PM dosing. Cymbalta  switched to Savella  25mg  Today she reports improvement in pain with Savella  50mg  BID and mobic  15mg  daily She denies any adverse effects. She was seen by rheumatology, plaquenil  was prescribed. She has not started med due to concern about possible side effects.  We discussed pros and cons of plaquenil . Advised to consider the impact of her symptoms on  current quality of life. She verbalized understanding and agreed to start plaquenil  as prescribed. Maintain savella  and mobic  dose. F/up in 6months   Reviewed medical, surgical, and social history  today  Medications: Show/hide medication list[1] Reviewed past medical and social history.   ROS per HPI above      Objective:  BP (!) 138/96 (BP Location: Left Arm, Patient Position: Sitting, Cuff Size: Normal)   Pulse 82   Temp 97.9 F (36.6 C) (Oral)   Ht 4' 10 (1.473 m)   Wt 175 lb 6.4 oz (79.6 kg)   LMP 01/14/2025   SpO2 99%   BMI 36.66 kg/m      Physical Exam Vitals and nursing note reviewed.  Cardiovascular:     Rate and Rhythm: Normal rate and regular rhythm.     Pulses: Normal pulses.     Heart sounds: Normal heart sounds.  Pulmonary:     Effort: Pulmonary effort is normal.     Breath sounds: Normal breath sounds.  Neurological:     Mental Status: She is alert and oriented to person, place, and time.     No results found for any visits on 01/25/25.    Assessment & Plan:    Problem List Items Addressed This Visit     Class 2 severe obesity due to excess calories with serious comorbidity and body mass index (BMI) of 36.0 to 36.9 in adult   Patient has not been able to attain 5% weight loss in the last 6months despite the following lifestyle modifications x 6months without success: caloric restrictions and increased physical exercise.  No previous medication used Wt Readings from Last 3 Encounters:  01/25/25 175  lb 6.4 oz (79.6 kg)  01/18/25 177 lb 6.4 oz (80.5 kg)  12/18/24 174 lb (78.9 kg)    She agreed to referral to [facility] weight management clinic. Check hgbA1c and lipid panel      Relevant Orders   Hemoglobin A1c   Lipid panel   Amb Ref to Medical Weight Management   Myofascial pain   D/c cymbalta  2months ago due to persistent daytime somnolence despite PM dosing. Cymbalta  switched to Savella  25mg  Today she reports improvement in pain with Savella  50mg  BID and mobic  15mg  daily She denies any adverse effects. She was seen by rheumatology, plaquenil  was prescribed. She has not started med due to concern about possible side effects.  We  discussed pros and cons of plaquenil . Advised to consider the impact of her symptoms on  current quality of life. She verbalized understanding and agreed to start plaquenil  as prescribed. Maintain savella  and mobic  dose. F/up in 6months      Relevant Medications   Milnacipran  (SAVELLA ) 50 MG TABS tablet   OSA (obstructive sleep apnea)   Mild OBSTRUCTIVE SLEEP APNEA per HST on 12/14/2024. Weight loss recommended      Primary hypertension - Primary   Elevated BP today due to missed AM dose of metoprolol  No BP readings at home BP Readings from Last 3 Encounters:  01/25/25 (!) 138/96  01/18/25 133/87  12/18/24 (!) 165/102    Advised about the importance of med dose compliance. F/up in 3months      Return in about 6 months (around 07/25/2025) for CPE (fasting).     Roselie Mood, NP      [1]  Outpatient Medications Prior to Visit  Medication Sig   albuterol  (VENTOLIN  HFA) 108 (90 Base) MCG/ACT inhaler 1 puff as needed Inhalation every 4 hrs   Cholecalciferol (VITAMIN D3) 1000 units CAPS Take 3,000 Int'l Units/day by mouth daily.   eletriptan  (RELPAX ) 20 MG tablet TAKE 1 TABLET (20 MG TOTAL) BY MOUTH DAILY AS NEEDED FOR MIGRAINE OR HEADACHE. MAY REPEAT IN 2 HOURS IF HEADACHE PERSISTS OR RECURS. NO MORE THAN 80MG  IN 24HRS   EPINEPHrine  0.3 mg/0.3 mL IJ SOAJ injection Inject 0.3 mg into the muscle as needed for anaphylaxis.   famotidine  (PEPCID ) 20 MG tablet Take 1 tablet (20 mg total) by mouth 2 (two) times daily.   folic acid  (FOLVITE ) 1 MG tablet Take 2 tablets (2 mg total) by mouth daily.   meloxicam  (MOBIC ) 15 MG tablet Take 1 tablet (15 mg total) by mouth daily. With food   Metoprolol  Succinate 25 MG CS24 Take 25 mg by mouth 2 (two) times daily.   Metoprolol  Tartrate 75 MG TABS TAKE 1 TABLET BY MOUTH TWICE A DAY   Multiple Vitamins-Minerals (WOMENS MULTIVITAMIN PLUS) TABS Take by mouth daily.   omalizumab  (XOLAIR ) 300 MG/2  ML prefilled syringe Inject 300 mg into the skin  every 28 (twenty-eight) days.   tranexamic acid (LYSTEDA) 650 MG TABS tablet Take 1,300 mg by mouth 3 (three) times daily. Take for up to 5 days during menstrual period only.   valACYclovir  (VALTREX ) 1000 MG tablet Take 1,000 mg by mouth.   [DISCONTINUED] Milnacipran  HCl (SAVELLA ) 25 MG TABS 1/2tab daily x 1day, then 1/2tab in AM and PM x3days, then 1tab in Am and PM x3days, then 2tabs in AM and PM continuously.   hydroxychloroquine  (PLAQUENIL ) 200 MG tablet Take 1 tablet (200 mg total) by mouth 2 (two) times daily. Take with food.   Facility-Administered Medications Prior to  Visit  Medication Dose Route Frequency Provider   omalizumab  (XOLAIR ) prefilled syringe 300 mg  300 mg Subcutaneous Q28 days Lorin Norris, MD   omalizumab  (XOLAIR ) prefilled syringe 300 mg  300 mg Subcutaneous Q28 days Lorin Norris, MD   "

## 2025-01-25 NOTE — Patient Instructions (Addendum)
 Ask if insurance will cover wegovy or zepbound or contrave? Ask cancer center if lipid panel and hgbA1c can be drawn? If not, schedule fasting lab appointment for this office. Maintain current med doses.

## 2025-01-25 NOTE — Telephone Encounter (Signed)
 Copied from CRM [id number]. Topic: Referral - Question >> Jan 25, 2025  1:14 PM Alfonso ORN wrote: Reason for CRM: pt called to f/u on referral . stated clinc advised they do not provide weight management service only nutritionist for diabetes . Pt wants to confirm if provider would like her to still go there for nutritionist or get referral change for clinic offering both. Please call pt to advise

## 2025-01-25 NOTE — Assessment & Plan Note (Addendum)
 Patient has not been able to attain 5% weight loss in the last 6months despite the following lifestyle modifications x 6months without success: caloric restrictions and increased physical exercise.  No previous medication used Wt Readings from Last 3 Encounters:  01/25/25 175 lb 6.4 oz (79.6 kg)  01/18/25 177 lb 6.4 oz (80.5 kg)  12/18/24 174 lb (78.9 kg)    She agreed to referral to [facility] weight management clinic. Check hgbA1c and lipid panel

## 2025-01-25 NOTE — Assessment & Plan Note (Signed)
 D/c cymbalta  2months ago due to persistent daytime somnolence despite PM dosing. Cymbalta  switched to Savella  25mg  Today she reports improvement in pain with Savella  50mg  BID and mobic  15mg  daily She denies any adverse effects. She was seen by rheumatology, plaquenil  was prescribed. She has not started med due to concern about possible side effects.  We discussed pros and cons of plaquenil . Advised to consider the impact of her symptoms on  current quality of life. She verbalized understanding and agreed to start plaquenil  as prescribed. Maintain savella  and mobic  dose. F/up in 6months

## 2025-01-25 NOTE — Telephone Encounter (Signed)
 This pertains to the referral placed today 01/25/25 by Roselie.

## 2025-01-25 NOTE — Assessment & Plan Note (Addendum)
 Mild OBSTRUCTIVE SLEEP APNEA per HST on 12/14/2024. Weight loss recommended

## 2025-01-25 NOTE — Assessment & Plan Note (Signed)
 Elevated BP today due to missed AM dose of metoprolol  No BP readings at home BP Readings from Last 3 Encounters:  01/25/25 (!) 138/96  01/18/25 133/87  12/18/24 (!) 165/102    Advised about the importance of med dose compliance. F/up in 3months

## 2025-01-26 NOTE — Telephone Encounter (Signed)
 Called and left a voice message for patient per DPR informing her that the referral has been changed and someone will be in contact with her about scheduling the appointment.

## 2025-01-31 ENCOUNTER — Encounter (INDEPENDENT_AMBULATORY_CARE_PROVIDER_SITE_OTHER): Payer: Self-pay

## 2025-02-01 ENCOUNTER — Encounter: Payer: Self-pay | Admitting: Medical Oncology

## 2025-02-01 ENCOUNTER — Ambulatory Visit: Payer: Self-pay | Admitting: Medical Oncology

## 2025-02-01 ENCOUNTER — Encounter: Payer: Self-pay | Admitting: Family

## 2025-02-01 ENCOUNTER — Inpatient Hospital Stay: Admitting: Medical Oncology

## 2025-02-01 ENCOUNTER — Inpatient Hospital Stay: Attending: Medical Oncology

## 2025-02-01 ENCOUNTER — Other Ambulatory Visit: Payer: Self-pay

## 2025-02-01 ENCOUNTER — Other Ambulatory Visit (HOSPITAL_BASED_OUTPATIENT_CLINIC_OR_DEPARTMENT_OTHER): Payer: Self-pay

## 2025-02-01 VITALS — BP 113/78 | HR 82 | Temp 98.2°F | Resp 16 | Ht <= 58 in | Wt 175.0 lb

## 2025-02-01 DIAGNOSIS — E559 Vitamin D deficiency, unspecified: Secondary | ICD-10-CM

## 2025-02-01 DIAGNOSIS — D500 Iron deficiency anemia secondary to blood loss (chronic): Secondary | ICD-10-CM

## 2025-02-01 DIAGNOSIS — D75839 Thrombocytosis, unspecified: Secondary | ICD-10-CM

## 2025-02-01 DIAGNOSIS — N921 Excessive and frequent menstruation with irregular cycle: Secondary | ICD-10-CM

## 2025-02-01 DIAGNOSIS — R5383 Other fatigue: Secondary | ICD-10-CM

## 2025-02-01 DIAGNOSIS — D573 Sickle-cell trait: Secondary | ICD-10-CM

## 2025-02-01 LAB — RETIC PANEL
Immature Retic Fract: 10.9 % (ref 2.3–15.9)
RBC.: 4.8 MIL/uL (ref 3.87–5.11)
Retic Count, Absolute: 85 10*3/uL (ref 19.0–186.0)
Retic Ct Pct: 1.8 % (ref 0.4–3.1)
Reticulocyte Hemoglobin: 32.2 pg

## 2025-02-01 LAB — CBC WITH DIFFERENTIAL (CANCER CENTER ONLY)
Abs Immature Granulocytes: 0.09 10*3/uL — ABNORMAL HIGH (ref 0.00–0.07)
Basophils Absolute: 0 10*3/uL (ref 0.0–0.1)
Basophils Relative: 0 %
Eosinophils Absolute: 0.1 10*3/uL (ref 0.0–0.5)
Eosinophils Relative: 1 %
HCT: 39.4 % (ref 36.0–46.0)
Hemoglobin: 13.2 g/dL (ref 12.0–15.0)
Immature Granulocytes: 1 %
Lymphocytes Relative: 18 %
Lymphs Abs: 1.9 10*3/uL (ref 0.7–4.0)
MCH: 27.8 pg (ref 26.0–34.0)
MCHC: 33.5 g/dL (ref 30.0–36.0)
MCV: 83.1 fL (ref 80.0–100.0)
Monocytes Absolute: 0.6 10*3/uL (ref 0.1–1.0)
Monocytes Relative: 5 %
Neutro Abs: 8 10*3/uL — ABNORMAL HIGH (ref 1.7–7.7)
Neutrophils Relative %: 75 %
Platelet Count: 407 10*3/uL — ABNORMAL HIGH (ref 150–400)
RBC: 4.74 MIL/uL (ref 3.87–5.11)
RDW: 13.6 % (ref 11.5–15.5)
WBC Count: 10.8 10*3/uL — ABNORMAL HIGH (ref 4.0–10.5)
nRBC: 0 % (ref 0.0–0.2)

## 2025-02-01 LAB — VITAMIN D 25 HYDROXY (VIT D DEFICIENCY, FRACTURES): Vit D, 25-Hydroxy: 41.5 ng/mL (ref 30–100)

## 2025-02-01 LAB — IRON AND IRON BINDING CAPACITY (CC-WL,HP ONLY)
Iron: 65 ug/dL (ref 28–170)
Saturation Ratios: 20 % (ref 10.4–31.8)
TIBC: 326 ug/dL (ref 250–450)
UIBC: 261 ug/dL

## 2025-02-01 LAB — LACTATE DEHYDROGENASE: LDH: 180 U/L (ref 105–235)

## 2025-02-01 LAB — FERRITIN: Ferritin: 966 ng/mL — ABNORMAL HIGH (ref 11–307)

## 2025-02-01 MED ORDER — FOLIC ACID 1 MG PO TABS
2.0000 mg | ORAL_TABLET | Freq: Every day | ORAL | 6 refills | Status: AC
  Filled 2025-02-01: qty 180, 90d supply, fill #0

## 2025-02-01 NOTE — Progress Notes (Signed)
 " Hematology and Oncology Follow Up Visit  Kimberly Guerra [id number] 03-08-1986 39 y.o. 02/01/2025   Principle Diagnosis:  Iron  deficiency anemia-menometrorrhagia Sickle cell trait Vitamin D  Deficiency   Current Therapy:   IV iron -Venofer  given on 10/22/2022 - premeds Folic acid  2 mg p.o. daily OTC Vitamin D  3,000 I.U. Daily      Interim History:  Kimberly Guerra is back for follow-up.    Today she states that is doing well.   Chronic hives have improved on Xolair . 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   She has been seen by rheumatology. She has been diagnosed with seronegative arthritis. They started her on plaquenil  this week which she just started- this makes her feel a bit nauseated but otherwise well. She has follow up on March 19th.   In terms of her fatigue symptoms have continued. She reports that after her last 3 infusions she felt well for some time but in January her symptoms started to return. She is still taking vitamin D  supplementation- OTC currently 3,000 I.U. She has not noticed any improvement in her fatigue.   Bone pain has improved with Cymbalta  and meloxicam . Rheumatology work up showed a negative ANA. She continues to take 3,000 I.U. of vitamin D3 once daily.   There has been no bleeding to her knowledge other than her menstrual cycles- They are normal in nature: denies epistaxis, gingivitis, hemoptysis, hematemesis, hematuria, melena, excessive bruising, blood donation. Her last infusion of IV iron  was on 08/12/2023. OB-GYN has but her on tranexamic acid for her heavy menstrual cycles which has lightened/shortened periods.   She has had no change in bowel or bladder habits. No unintentional night sweats. She is having night sweats. She also has bone pains especially in her shins.   Overall, I would say that her performance status is ECOG 1.   Wt Readings from Last 3 Encounters:  02/01/25 175 lb (79.4 kg)   01/25/25 175 lb 6.4 oz (79.6 kg)  01/18/25 177 lb 6.4 oz (80.5 kg)   Medications:  Current Outpatient Medications:    albuterol  (VENTOLIN  HFA) 108 (90 Base) MCG/ACT inhaler, 1 puff as needed Inhalation every 4 hrs, Disp: , Rfl:    Cholecalciferol (VITAMIN D3) 1000 units CAPS, Take 3,000 Int'l Units/day by mouth daily., Disp: , Rfl:    eletriptan  (RELPAX ) 20 MG tablet, TAKE 1 TABLET (20 MG TOTAL) BY MOUTH DAILY AS NEEDED FOR MIGRAINE OR HEADACHE. MAY REPEAT IN 2 HOURS IF HEADACHE PERSISTS OR RECURS. NO MORE THAN 80MG  IN 24HRS, Disp: 12 tablet, Rfl: 0   EPINEPHrine  0.3 mg/0.3 mL IJ SOAJ injection, Inject 0.3 mg into the muscle as needed for anaphylaxis., Disp: 1 each, Rfl: 1   famotidine  (PEPCID ) 20 MG tablet, Take 1 tablet (20 mg total) by mouth 2 (two) times daily., Disp: 90 tablet, Rfl: 1   folic acid  (FOLVITE ) 1 MG tablet, Take 2 tablets (2 mg total) by mouth daily., Disp: 180 tablet, Rfl: 6   hydroxychloroquine  (PLAQUENIL ) 200 MG tablet, Take 1 tablet (200 mg total) by mouth 2 (two) times daily. Take with food., Disp: 180 tablet, Rfl: 0   meloxicam  (MOBIC ) 15 MG tablet, Take 1 tablet (15 mg total) by mouth daily. With food, Disp: 30 tablet, Rfl: 5   Metoprolol  Succinate 25 MG CS24, Take 25 mg by mouth 2 (two) times daily., Disp: , Rfl:    Metoprolol  Tartrate 75 MG TABS, TAKE 1 TABLET BY MOUTH TWICE A  DAY, Disp: 180 tablet, Rfl: 1   Milnacipran  (SAVELLA ) 50 MG TABS tablet, Take 1 tablet (50 mg total) by mouth 2 (two) times daily., Disp: 180 tablet, Rfl: 1   Multiple Vitamins-Minerals (WOMENS MULTIVITAMIN PLUS) TABS, Take by mouth daily., Disp: , Rfl:    omalizumab  (XOLAIR ) 300 MG/2  ML prefilled syringe, Inject 300 mg into the skin every 28 (twenty-eight) days., Disp: 2 mL, Rfl: 11   tranexamic acid (LYSTEDA) 650 MG TABS tablet, Take 1,300 mg by mouth 3 (three) times daily. Take for up to 5 days during menstrual period only., Disp: , Rfl:    valACYclovir  (VALTREX ) 1000 MG tablet, Take 1,000 mg  by mouth., Disp: , Rfl:   Current Facility-Administered Medications:    omalizumab  (XOLAIR ) prefilled syringe 300 mg, 300 mg, Subcutaneous, Q28 days, Lorin Norris, MD, 300 mg at 06/02/24 1522   omalizumab  (XOLAIR ) prefilled syringe 300 mg, 300 mg, Subcutaneous, Q28 days, Lorin Norris, MD, 300 mg at 10/12/24 1058  Allergies:  Allergies  Allergen Reactions   Procardia Xl [Nifedipine Er] Hives, Itching and Other (See Comments)    Severe headache   Topamax [Topiramate] Palpitations    Heart palpitations    Past Medical History, Surgical history, Social history, and Family History were reviewed and updated.  Review of Systems: Review of Systems  Constitutional: Negative.   HENT:  Negative.    Eyes: Negative.   Respiratory: Negative.    Cardiovascular: Negative.   Gastrointestinal: Negative.   Endocrine: Negative.   Genitourinary: Negative.    Musculoskeletal: Negative.   Skin: Negative.   Neurological: Negative.   Hematological: Negative.   Psychiatric/Behavioral: Negative.      Physical Exam:  height is 4' 10 (1.473 m) and weight is 175 lb (79.4 kg). Her oral temperature is 98.2 F (36.8 C). Her blood pressure is 113/78 and her pulse is 82. Her respiration is 16 and oxygen saturation is 97%.   Wt Readings from Last 3 Encounters:  02/01/25 175 lb (79.4 kg)  01/25/25 175 lb 6.4 oz (79.6 kg)  01/18/25 177 lb 6.4 oz (80.5 kg)    Physical Exam Vitals reviewed.  HENT:     Head: Normocephalic and atraumatic.  Eyes:     Pupils: Pupils are equal, round, and reactive to light.  Cardiovascular:     Rate and Rhythm: Normal rate and regular rhythm.     Heart sounds: Normal heart sounds.  Pulmonary:     Effort: Pulmonary effort is normal.     Breath sounds: Normal breath sounds.  Abdominal:     General: Bowel sounds are normal.     Palpations: Abdomen is soft.  Musculoskeletal:        General: No tenderness or deformity. Normal range of motion.     Cervical back:  Normal range of motion.  Lymphadenopathy:     Cervical: No cervical adenopathy.  Skin:    General: Skin is warm and dry.     Findings: No erythema or rash.  Neurological:     Mental Status: She is alert and oriented to person, place, and time.  Psychiatric:        Behavior: Behavior normal.        Thought Content: Thought content normal.        Judgment: Judgment normal.      Lab Results  Component Value Date   WBC 9.5 10/19/2024   HGB 12.7 10/19/2024   HCT 38.3 10/19/2024   MCV 83.4 10/19/2024   PLT 364 10/19/2024  Chemistry      Component Value Date/Time   NA 136 08/30/2024 1202   K 3.8 08/30/2024 1202   CL 103 08/30/2024 1202   CO2 24 08/30/2024 1202   BUN 9 08/30/2024 1202   CREATININE 0.56 08/30/2024 1202   CREATININE 0.68 07/20/2024 0930   CREATININE 0.69 12/06/2013 1631      Component Value Date/Time   CALCIUM 9.1 08/30/2024 1202   ALKPHOS 91 07/20/2024 0930   AST 16 07/20/2024 0930   ALT 12 07/20/2024 0930   BILITOT 0.4 07/20/2024 0930     Encounter Diagnoses  Name Primary?   Iron  deficiency anemia due to chronic blood loss Yes   Menometrorrhagia    Sickle cell trait    Thrombocytosis    Other fatigue    Vitamin D  deficiency    Impression and Plan: Kimberly Guerra is a very charming 39 year old African-American female with IDA secondary to menometrorrhagia and sickle cell trait. She currently gets IV iron  as needed. She also has a history of low vitamin D  which corrected with oral supplementation.   Today her Hgb is 13.2, MCV now 83.1, platelets 407 Iron  studies pending.  Vitamin D , iron , LDH pending.    Disposition RTC 3 months APP, labs (CBC, iron , ferritin, vitamin D )  Lauraine CHRISTELLA Dais, PA-C 2/5/20269:36 AM  "

## 2025-02-02 ENCOUNTER — Telehealth: Payer: Self-pay | Admitting: Nurse Practitioner

## 2025-02-02 NOTE — Telephone Encounter (Signed)
 Called patient and informed her that the PA is still being processed. Informed that once we hear back I will be in contact with her. She thanked me for calling and all questions (if any) were answered.

## 2025-02-02 NOTE — Telephone Encounter (Signed)
 Copied from CRM #[id number]. Topic: General - Call Back - No Documentation >> Feb 02, 2025 12:04 PM Leah C wrote: Reason for CRM: Pt would like a follow up call on pre-authorization for weight loss medicine.    [phone] (M)

## 2025-02-08 ENCOUNTER — Other Ambulatory Visit

## 2025-03-15 ENCOUNTER — Ambulatory Visit

## 2025-05-03 ENCOUNTER — Inpatient Hospital Stay: Admitting: Medical Oncology

## 2025-05-03 ENCOUNTER — Inpatient Hospital Stay

## 2025-07-26 ENCOUNTER — Encounter: Admitting: Nurse Practitioner
